# Patient Record
Sex: Female | Born: 1960 | Race: White | Hispanic: No | Marital: Single | Smoking: Current every day smoker
Health system: Southern US, Community
[De-identification: ages and names within clinical notes are randomized; demographics above are authoritative.]

## PROBLEM LIST (undated history)

## (undated) DIAGNOSIS — I739 Peripheral vascular disease, unspecified: Secondary | ICD-10-CM

## (undated) DIAGNOSIS — J449 Chronic obstructive pulmonary disease, unspecified: Secondary | ICD-10-CM

## (undated) DIAGNOSIS — F32A Depression, unspecified: Secondary | ICD-10-CM

## (undated) DIAGNOSIS — E785 Hyperlipidemia, unspecified: Secondary | ICD-10-CM

## (undated) DIAGNOSIS — I639 Cerebral infarction, unspecified: Secondary | ICD-10-CM

## (undated) DIAGNOSIS — R0600 Dyspnea, unspecified: Secondary | ICD-10-CM

## (undated) HISTORY — DX: Depression, unspecified: F32.A

## (undated) HISTORY — PX: INSERT / REPLACE / REMOVE PACEMAKER: SUR710

## (undated) HISTORY — PX: NO PAST SURGERIES: SHX2092

## (undated) HISTORY — DX: Chronic obstructive pulmonary disease, unspecified: J44.9

## (undated) HISTORY — PX: EYE SURGERY: SHX253

## (undated) HISTORY — DX: Hyperlipidemia, unspecified: E78.5

## (undated) HISTORY — PX: TUBAL LIGATION: SHX77

---

## 2020-09-12 ENCOUNTER — Other Ambulatory Visit: Payer: Self-pay

## 2020-09-12 ENCOUNTER — Ambulatory Visit (INDEPENDENT_AMBULATORY_CARE_PROVIDER_SITE_OTHER): Payer: 59 | Admitting: Family Medicine

## 2020-09-12 ENCOUNTER — Encounter: Payer: Self-pay | Admitting: Family Medicine

## 2020-09-12 DIAGNOSIS — R55 Syncope and collapse: Secondary | ICD-10-CM

## 2020-09-12 DIAGNOSIS — Z1211 Encounter for screening for malignant neoplasm of colon: Secondary | ICD-10-CM

## 2020-09-12 DIAGNOSIS — Z1231 Encounter for screening mammogram for malignant neoplasm of breast: Secondary | ICD-10-CM

## 2020-09-12 DIAGNOSIS — I739 Peripheral vascular disease, unspecified: Secondary | ICD-10-CM

## 2020-09-12 DIAGNOSIS — E785 Hyperlipidemia, unspecified: Secondary | ICD-10-CM | POA: Insufficient documentation

## 2020-09-12 DIAGNOSIS — F419 Anxiety disorder, unspecified: Secondary | ICD-10-CM

## 2020-09-12 HISTORY — DX: Encounter for screening mammogram for malignant neoplasm of breast: Z12.31

## 2020-09-12 HISTORY — DX: Peripheral vascular disease, unspecified: I73.9

## 2020-09-12 HISTORY — DX: Syncope and collapse: R55

## 2020-09-12 MED ORDER — TRAZODONE HCL 50 MG PO TABS: 50.0000 mg | ORAL_TABLET | Freq: Every day | ORAL | 3 refills | Status: DC

## 2020-09-12 MED ORDER — HYDROXYZINE HCL 25 MG PO TABS: 25.0000 mg | ORAL_TABLET | Freq: Three times a day (TID) | ORAL | 1 refills | Status: DC | PRN

## 2020-09-12 NOTE — Progress Notes (Signed)
New Patient Office Visit [facility] OV 3/21 History of Present Illness: Ms. Leslie Franklin (DoB: 09-01-1960) is a 60 y.o. female who presented to to discuss her family history of BRCA gene mutation. Her daughter has Hereditary Breast and Ovarian Cancer syndrome, having tested positive for BRCA2 c.9249C>G (p.Tyr3098*) mutation in December 2020. Based on autosomal dominant inheritance, Ms. Leslie Franklin has 50% risk of having the same mutation.   Oncology history: [name] has a history of cervical cancer diagnosed at age 17, treated with a LEEP procedure.   Family History of Cancer: A detailed 4-generation pedigree was elicited from patient.   Daughter with breast cancer at age 69. Genetic testing revealed that she has a BRCA2 mutation called c.9249C>G (p.Tyr3098*) Pt has not been tested for mutation-pt states she has kit at home  Subjective:  Patient ID: Leslie Franklin, female    DOB: 1960/10/12  Age: 60 y.o. MRN: [id number]  CC:  Chief Complaint  Patient presents with  . Establish Care  syncope-2 episodes-both related to movement.   HPI Halah Franklin presents for syncopal episodes-getting up and down pt passed out-one witnessed-no movement of arms/legs, no loss of urine. Pt recovered within a few minutes. Pt did not fell CP/SOB prior. NO headache or visual changes.  Anxiety-hydroxyzine in the past  Daughter with breast cancer-+ genetics Social History   Socioeconomic History  . Marital status: Not on file    Spouse name: Not on file  . Number of children: Not on file  . Years of education: Not on file  . Highest education level: Not on file  Occupational History  . Not on file  Tobacco Use  . Smoking status: Not on file  . Smokeless tobacco: Not on file  Substance and Sexual Activity  . Alcohol use: Not on file  . Drug use: Not on file  . Sexual activity: Not on file  Other Topics Concern  . Not on file  Social History Narrative  . Not on file   Social  Determinants of Health   Financial Resource Strain: Not on file  Food Insecurity: Not on file  Transportation Needs: Not on file  Physical Activity: Not on file  Stress: Not on file  Social Connections: Not on file  Intimate Partner Violence: Not on file    ROS Review of Systems  Constitutional: Positive for fatigue.  HENT:       Hearing loss  Eyes:       Glasses  Respiratory: Positive for cough.        Quit smoking 4 months ago  Cardiovascular: Negative.  Negative for chest pain.  Gastrointestinal: Positive for nausea.  Endocrine: Positive for heat intolerance.  Genitourinary:       Pt has not had a pap smear in 15 years-cervical cancer in biopsy  Musculoskeletal: Positive for back pain and neck pain. Negative for arthralgias.       Leg cramps  Skin: Negative.   Allergic/Immunologic: Negative for environmental allergies.  Neurological: Positive for dizziness and syncope.       No h/o of seizure d/o  Hematological: Negative.   Psychiatric/Behavioral: Positive for agitation and sleep disturbance. The patient is nervous/anxious.     Objective:  p-97, oxygen 96, bp 124/64, weight 122.6 Today's Vitals   09/12/20 1454 09/12/20 1458 09/12/20 1523  SpO2: 97% 96% 97%   Physical Exam Vitals reviewed.  Constitutional:      Appearance: Normal appearance.  HENT:     Head: Normocephalic  and atraumatic.     Right Ear: Tympanic membrane normal.     Left Ear: Tympanic membrane normal.     Nose: Nose normal.     Mouth/Throat:     Mouth: Mucous membranes are moist.  Eyes:     Conjunctiva/sclera: Conjunctivae normal.  Cardiovascular:     Rate and Rhythm: Normal rate and regular rhythm.     Pulses: Normal pulses.     Heart sounds: Normal heart sounds.  Pulmonary:     Effort: Pulmonary effort is normal.     Breath sounds: Normal breath sounds.  Abdominal:     General: Bowel sounds are normal.     Palpations: Abdomen is soft.  Musculoskeletal:        General: Normal range  of motion.     Cervical back: Normal range of motion and neck supple.  Neurological:     Mental Status: She is alert and oriented to person, place, and time.  Psychiatric:        Mood and Affect: Mood normal.        Behavior: Behavior normal.   ECG-SR  Assessment & Plan:  1. Hyperlipidemia, unspecified hyperlipidemia type  no medication currently - CBC with Differential - CMP14+EGFR - TSH - Lipid Panel  2. Syncope, unspecified syncope type ecg SR-2 syncopal episode related to [facility] static laying to sitting and standing-increase fluids. Pt eating well.  - CBC with Differential - CMP14+EGFR - TSH - Lipid Panel - EKG 12-Lead - Ambulatory referral to Cardiology  3. Screening mammogram for breast cancer - MM Digital Screening; Future  4. Screening for colon cancer - Ambulatory referral to Gastroenterology  5. Claudication ([place]) Pain with walking, no pain after resting-+ h/o tob use - Ambulatory referral to Vascular Surgery 6. Anxiety Trazodone, hydroxyzine Follow-up:  labwork tomorrow-schedule pap smear GI -colonoscopy Vascular-claudication mammogram  LISA [name], MD

## 2020-09-12 NOTE — Patient Instructions (Addendum)
[email or web address]">  Depression Screening Depression screening is a tool that your health care provider can use to learn if you have symptoms of depression. Depression is a common condition with many symptoms that are also often found in other conditions. Depression is treatable, but it must first be diagnosed. You may not know that certain feelings, thoughts, and behaviors that you are having can be symptoms of depression. Taking a depression screening test can help you and your health care provider decide if you need more assessment, or if you should be referred to a mental health care provider. What are the screening tests?  You may have a physical exam to see if another condition is affecting your mental health. You may have a blood or urine sample taken during the physical exam.  You may be interviewed using a screening tool that was developed from research, such as one of these: ? Patient Health Questionnaire (PHQ). This is a set of either 2 or 9 questions. A health care provider who has been trained to score this screening test uses a guide to assess if your symptoms suggest that you may have depression. ? Hamilton Depression Rating Scale (HAM-D). This is a set of either 17 or 24 questions. You may be asked to take it again during or after your treatment, to see if your depression has gotten better. ? Beck Depression Inventory (BDI). This is a set of 21 multiple choice questions. Your health care provider scores your answers to assess:  Your level of depression, ranging from mild to severe.  Your response to treatment.  Your health care provider may talk with you about your daily activities, such as eating, sleeping, work, and recreation, and ask if you have had any changes in activity.  Your health care provider may ask you to see a mental health specialist, such as a psychiatrist or psychologist, for more  evaluation. Who should be screened for depression?  All adults, including adults with a family history of a mental health disorder.  Adolescents who are 89-77 years old.  People who are recovering from a myocardial infarction (MI).  Pregnant women, or women who have given birth.  People who have a long-term (chronic) illness.  Anyone who has been diagnosed with another type of a mental health disorder.  Anyone who has symptoms that could show depression.   What do my results mean? Your health care provider will review the results of your depression screening, physical exam, and lab tests. Positive screens suggest that you may have depression. Screening is the first step in getting the care that you may need. It is up to you to get your screening results. Ask your health care provider, or the department that is doing your screening tests, when your results will be ready. Talk with your health care provider about your results and diagnosis. A diagnosis of depression is made using the Diagnostic and Statistical Manual of Mental Disorders (DSM-V). This is a book that lists the number and type of symptoms that must be present for a health care provider to give a specific diagnosis.  Your health care provider may work with you to treat your symptoms of depression, or your health care provider may help you find a mental health provider who can assess, diagnose, and treat your depression. Get help right away if:  You have thoughts about hurting yourself or others. If you ever feel like you may hurt yourself or others, or have thoughts about taking your own life, get help  right away. You can go to your nearest emergency department or call:  Your local emergency services (911 in the [place]  A suicide crisis helpline, such as the National Suicide Prevention Lifeline at (510)718-4311. This is open 24 hours a day. Summary  Depression screening is the first step in getting the help that you may  need.  If your screening test shows symptoms of depression (is positive), your health care provider may ask you to see a mental health provider.  Anyone who is age 14 or older should be screened for depression. This information is not intended to replace advice given to you by your health care provider. Make sure you discuss any questions you have with your health care provider. Document Revised: 02/08/2020 Document Reviewed: 02/08/2020 Elsevier Patient Education  2021 [employer]. Syncope Syncope is when you pass out (faint) for a short time. It is caused by a sudden decrease in blood flow to the brain. Signs that you may be about to pass out include:  Feeling dizzy or light-headed.  Feeling sick to your stomach (nauseous).  Seeing all white or all black.  Having cold, clammy skin. If you pass out, get help right away. Call your local emergency services (911 in the [place] Do not drive yourself to the hospital. Follow these instructions at home: Watch for any changes in your symptoms. Take these actions to stay safe and help with your symptoms: Lifestyle  Do not drive, use machinery, or play sports until your doctor says it is okay.  Do not drink alcohol.  Do not use any products that contain nicotine or tobacco, such as cigarettes and e-cigarettes. If you need help quitting, ask your doctor.  Drink enough fluid to keep your pee (urine) pale yellow. General instructions  Take over-the-counter and prescription medicines only as told by your doctor.  If you are taking blood pressure or heart medicine, sit up and stand up slowly. Spend a few minutes getting ready to sit and then stand. This can help you feel less dizzy.  Have someone stay with you until you feel stable.  If you start to feel like you might pass out, lie down right away and raise (elevate) your feet above the level of your heart. Breathe deeply and steadily. Wait until all of the symptoms are gone.  Keep all  follow-up visits as told by your doctor. This is important. Get help right away if:  You have a very bad headache.  You pass out once or more than once.  You have pain in your chest, belly, or back.  You have a very fast or uneven heartbeat (palpitations).  It hurts to breathe.  You are bleeding from your mouth or your bottom (rectum).  You have black or tarry poop (stool).  You have jerky movements that you cannot control (seizure).  You are confused.  You have trouble walking.  You are very weak.  You have vision problems. These symptoms may be an emergency. Do not wait to see if the symptoms will go away. Get medical help right away. Call your local emergency services (911 in the [place] Do not drive yourself to the hospital. Summary  Syncope is when you pass out (faint) for a short time. It is caused by a sudden decrease in blood flow to the brain.  Signs that you may be about to faint include feeling dizzy, light-headed, or sick to your stomach, seeing all white or all black, or having cold, clammy skin.  If  you start to feel like you might pass out, lie down right away and raise (elevate) your feet above the level of your heart. Breathe deeply and steadily. Wait until all of the symptoms are gone. This information is not intended to replace advice given to you by your health care provider. Make sure you discuss any questions you have with your health care provider. Document Revised: 09/27/2019 Document Reviewed: 09/29/2017 Elsevier Patient Education  2021 [employer] Inc. Mammogram F/u pap smear GI colonoscopy Claudication-vascular surgery Syncope-cardiology Lab work tomorrow -FASTING

## 2020-09-13 ENCOUNTER — Other Ambulatory Visit: Payer: Self-pay

## 2020-09-13 DIAGNOSIS — F411 Generalized anxiety disorder: Secondary | ICD-10-CM | POA: Insufficient documentation

## 2020-09-13 DIAGNOSIS — F419 Anxiety disorder, unspecified: Secondary | ICD-10-CM | POA: Insufficient documentation

## 2020-09-13 HISTORY — DX: Generalized anxiety disorder: F41.1

## 2020-09-13 HISTORY — DX: Anxiety disorder, unspecified: F41.9

## 2020-09-14 LAB — LIPID PANEL
Chol/HDL Ratio: 5.7 ratio — ABNORMAL HIGH (ref 0.0–4.4)
Cholesterol, Total: 292 mg/dL — ABNORMAL HIGH (ref 100–199)
HDL: 51 mg/dL (ref >39)
LDL Chol Calc (NIH): 218 mg/dL — ABNORMAL HIGH (ref 0–99)
Triglycerides: 128 mg/dL (ref 0–149)
VLDL Cholesterol Cal: 23 mg/dL (ref 5–40)

## 2020-09-14 LAB — CBC WITH DIFFERENTIAL/PLATELET
Basophils Absolute: 0.2 10*3/uL (ref 0.0–0.2)
Basos: 2 %
EOS (ABSOLUTE): 0.3 10*3/uL (ref 0.0–0.4)
Eos: 3 %
Hematocrit: 44.7 % (ref 34.0–46.6)
Hemoglobin: 14.9 g/dL (ref 11.1–15.9)
Immature Grans (Abs): 0 10*3/uL (ref 0.0–0.1)
Immature Granulocytes: 0 %
Lymphocytes Absolute: 2.7 10*3/uL (ref 0.7–3.1)
Lymphs: 34 %
MCH: 29.4 pg (ref 26.6–33.0)
MCHC: 33.3 g/dL (ref 31.5–35.7)
MCV: 88 fL (ref 79–97)
Monocytes Absolute: 0.5 10*3/uL (ref 0.1–0.9)
Monocytes: 7 %
Neutrophils Absolute: 4.2 10*3/uL (ref 1.4–7.0)
Neutrophils: 54 %
Platelets: 334 10*3/uL (ref 150–450)
RBC: 5.06 x10E6/uL (ref 3.77–5.28)
RDW: 11.9 % (ref 11.7–15.4)
WBC: 7.8 10*3/uL (ref 3.4–10.8)

## 2020-09-14 LAB — CMP14+EGFR
ALT: 12 IU/L (ref 0–32)
AST: 19 IU/L (ref 0–40)
Albumin/Globulin Ratio: 1.7 (ref 1.2–2.2)
Albumin: 4.2 g/dL (ref 3.8–4.9)
Alkaline Phosphatase: 111 IU/L (ref 44–121)
BUN/Creatinine Ratio: 17 (ref 9–23)
BUN: 17 mg/dL (ref 6–24)
Bilirubin Total: 0.5 mg/dL (ref 0.0–1.2)
CO2: 23 mmol/L (ref 20–29)
Calcium: 9.7 mg/dL (ref 8.7–10.2)
Chloride: 99 mmol/L (ref 96–106)
Creatinine, Ser: 1.03 mg/dL — ABNORMAL HIGH (ref 0.57–1.00)
GFR calc Af Amer: 70 mL/min/{1.73_m2} (ref >59)
GFR calc non Af Amer: 60 mL/min/{1.73_m2} (ref >59)
Globulin, Total: 2.5 g/dL (ref 1.5–4.5)
Glucose: 92 mg/dL (ref 65–99)
Potassium: 4.6 mmol/L (ref 3.5–5.2)
Sodium: 137 mmol/L (ref 134–144)
Total Protein: 6.7 g/dL (ref 6.0–8.5)

## 2020-09-14 LAB — TSH: TSH: 1.22 u[IU]/mL (ref 0.450–4.500)

## 2020-09-14 LAB — CARDIOVASCULAR RISK ASSESSMENT

## 2020-09-16 DIAGNOSIS — F32A Depression, unspecified: Secondary | ICD-10-CM | POA: Insufficient documentation

## 2020-09-16 DIAGNOSIS — J449 Chronic obstructive pulmonary disease, unspecified: Secondary | ICD-10-CM | POA: Insufficient documentation

## 2020-09-17 ENCOUNTER — Ambulatory Visit (INDEPENDENT_AMBULATORY_CARE_PROVIDER_SITE_OTHER): Payer: 59 | Admitting: Nurse Practitioner

## 2020-09-17 ENCOUNTER — Encounter: Payer: Self-pay | Admitting: Nurse Practitioner

## 2020-09-17 ENCOUNTER — Other Ambulatory Visit: Payer: Self-pay

## 2020-09-17 VITALS — BP 98/62 | HR 78 | Temp 97.0°F | Ht 62.0 in | Wt 123.0 lb

## 2020-09-17 DIAGNOSIS — Z87891 Personal history of nicotine dependence: Secondary | ICD-10-CM

## 2020-09-17 DIAGNOSIS — Z01419 Encounter for gynecological examination (general) (routine) without abnormal findings: Secondary | ICD-10-CM

## 2020-09-17 DIAGNOSIS — Z8742 Personal history of other diseases of the female genital tract: Secondary | ICD-10-CM | POA: Diagnosis not present

## 2020-09-17 DIAGNOSIS — Z8481 Family history of carrier of genetic disease: Secondary | ICD-10-CM | POA: Diagnosis not present

## 2020-09-17 DIAGNOSIS — E785 Hyperlipidemia, unspecified: Secondary | ICD-10-CM

## 2020-09-17 MED ORDER — ROSUVASTATIN CALCIUM 5 MG PO TABS: 5.0000 mg | ORAL_TABLET | Freq: Every day | ORAL | 0 refills | Status: DC

## 2020-09-17 NOTE — Patient Instructions (Addendum)
We will call you with pap results Return in 58-month (fasting)  High Cholesterol  High cholesterol is a condition in which the blood has high levels of a white, waxy substance similar to fat (cholesterol). The liver makes all the cholesterol that the body needs. The human body needs small amounts of cholesterol to help build cells. A person gets extra or excess cholesterol from the food that he or she eats. The blood carries cholesterol from the liver to the rest of the body. If you have high cholesterol, deposits (plaques) may build up on the walls of your arteries. Arteries are the blood vessels that carry blood away from your heart. These plaques make the arteries narrow and stiff. Cholesterol plaques increase your risk for heart attack and stroke. Work with your health care provider to keep your cholesterol levels in a healthy range. What increases the risk? The following factors may make you more likely to develop this condition: Eating foods that are high in animal fat (saturated fat) or cholesterol. Being overweight. Not getting enough exercise. A family history of high cholesterol (familial hypercholesterolemia). Use of tobacco products. Having diabetes. What are the signs or symptoms? There are no symptoms of this condition. How is this diagnosed? This condition may be diagnosed based on the results of a blood test. If you are older than 58years of age, your health care provider may check your cholesterol levels every 4-6 years. You may be checked more often if you have high cholesterol or other risk factors for heart disease. The blood test for cholesterol measures: "Bad" cholesterol, or LDL cholesterol. This is the main type of cholesterol that causes heart disease. The desired level is less than 100 mg/dL. "Good" cholesterol, or HDL cholesterol. HDL helps protect against heart disease by cleaning the arteries and carrying the LDL to the liver for processing. The desired level for  HDL is 60 mg/dL or higher. Triglycerides. These are fats that your body can store or burn for energy. The desired level is less than 150 mg/dL. Total cholesterol. This measures the total amount of cholesterol in your blood and includes LDL, HDL, and triglycerides. The desired level is less than 200 mg/dL. How is this treated? This condition may be treated with: Diet changes. You may be asked to eat foods that have more fiber and less saturated fats or added sugar. Lifestyle changes. These may include regular exercise, maintaining a healthy weight, and quitting use of tobacco products. Medicines. These are given when diet and lifestyle changes have not worked. You may be prescribed a statin medicine to help lower your cholesterol levels. Follow these instructions at home: Eating and drinking Eat a healthy, balanced diet. This diet includes: Daily servings of a variety of fresh, frozen, or canned fruits and vegetables. Daily servings of whole grain foods that are rich in fiber. Foods that are low in saturated fats and trans fats. These include poultry and fish without skin, lean cuts of meat, and low-fat dairy products. A variety of fish, especially oily fish that contain omega-3 fatty acids. Aim to eat fish at least 2 times a week. Avoid foods and drinks that have added sugar. Use healthy cooking methods, such as roasting, grilling, broiling, baking, poaching, steaming, and stir-frying. Do not fry your food except for stir-frying.   Lifestyle Get regular exercise. Aim to exercise for a total of 150 minutes a week. Increase your activity level by doing activities such as gardening, walking, and taking the stairs. Do not use  any products that contain nicotine or tobacco, such as cigarettes, e-cigarettes, and chewing tobacco. If you need help quitting, ask your health care provider.   General instructions Take over-the-counter and prescription medicines only as told by your health care  provider. Keep all follow-up visits as told by your health care provider. This is important. Where to find more information American Heart Association: www.heart.org National Heart, Lung, and Blood Institute: https://wilson-eaton.com/ Contact a health care provider if: You have trouble achieving or maintaining a healthy diet or weight. You are starting an exercise program. You are unable to stop smoking. Get help right away if: You have chest pain. You have trouble breathing. You have any symptoms of a stroke. "BE FAST" is an easy way to remember the main warning signs of a stroke: B - Balance. Signs are dizziness, sudden trouble walking, or loss of balance. E - Eyes. Signs are trouble seeing or a sudden change in vision. F - Face. Signs are sudden weakness or numbness of the face, or the face or eyelid drooping on one side. A - Arms. Signs are weakness or numbness in an arm. This happens suddenly and usually on one side of the body. S - Speech. Signs are sudden trouble speaking, slurred speech, or trouble understanding what people say. T - Time. Time to call emergency services. Write down what time symptoms started. You have other signs of a stroke, such as: A sudden, severe headache with no known cause. Nausea or vomiting. Seizure. These symptoms may represent a serious problem that is an emergency. Do not wait to see if the symptoms will go away. Get medical help right away. Call your local emergency services (911 in the [place] Do not drive yourself to the hospital. Summary Cholesterol plaques increase your risk for heart attack and stroke. Work with your health care provider to keep your cholesterol levels in a healthy range. Eat a healthy, balanced diet, get regular exercise, and maintain a healthy weight. Do not use any products that contain nicotine or tobacco, such as cigarettes, e-cigarettes, and chewing tobacco. Get help right away if you have any symptoms of a stroke. This information  is not intended to replace advice given to you by your health care provider. Make sure you discuss any questions you have with your health care provider. Document Revised: 07/17/2019 Document Reviewed: 07/17/2019 Elsevier Patient Education  2021 [place]. for follow-up Keep scheduled appointments for colonoscopy and mammogram  Preventive Care 53-9 Years Old, Female Preventive care refers to lifestyle choices and visits with your health care provider that can promote health and wellness. This includes:  A yearly physical exam. This is also called an annual wellness visit.  Regular dental and eye exams.  Immunizations.  Screening for certain conditions.  Healthy lifestyle choices, such as: ? Eating a healthy diet. ? Getting regular exercise. ? Not using drugs or products that contain nicotine and tobacco. ? Limiting alcohol use. What can I expect for my preventive care visit? Physical exam Your health care provider will check your:  Height and weight. These may be used to calculate your BMI (body mass index). BMI is a measurement that tells if you are at a healthy weight.  Heart rate and blood pressure.  Body temperature.  Skin for abnormal spots. Counseling Your health care provider may ask you questions about your:  Past medical problems.  Family's medical history.  Alcohol, tobacco, and drug use.  Emotional well-being.  Home life and relationship well-being.  Sexual activity.  Diet, exercise, and sleep habits.  Work and work Statistician.  Access to firearms.  Method of birth control.  Menstrual cycle.  Pregnancy history. What immunizations do I need? Vaccines are usually given at various ages, according to a schedule. Your health care provider will recommend vaccines for you based on your age, medical history, and lifestyle or other factors, such as travel or where you work.   What tests do I need? Blood tests  Lipid and cholesterol levels. These  may be checked every 5 years, or more often if you are over 37 years old.  Hepatitis C test.  Hepatitis B test. Screening  Lung cancer screening. You may have this screening every year starting at age 59 if you have a 30-pack-year history of smoking and currently smoke or have quit within the past 15 years.  Colorectal cancer screening. ? All adults should have this screening starting at age 55 and continuing until age 73. ? Your health care provider may recommend screening at age 55 if you are at increased risk. ? You will have tests every 1-10 years, depending on your results and the type of screening test.  Diabetes screening. ? This is done by checking your blood sugar (glucose) after you have not eaten for a while (fasting). ? You may have this done every 1-3 years.  Mammogram. ? This may be done every 1-2 years. ? Talk with your health care provider about when you should start having regular mammograms. This may depend on whether you have a family history of breast cancer.  BRCA-related cancer screening. This may be done if you have a family history of breast, ovarian, tubal, or peritoneal cancers.  Pelvic exam and Pap test. ? This may be done every 3 years starting at age 62. ? Starting at age 22, this may be done every 5 years if you have a Pap test in combination with an HPV test. Other tests  STD (sexually transmitted disease) testing, if you are at risk.  Bone density scan. This is done to screen for osteoporosis. You may have this scan if you are at high risk for osteoporosis. Talk with your health care provider about your test results, treatment options, and if necessary, the need for more tests. Follow these instructions at home: Eating and drinking  Eat a diet that includes fresh fruits and vegetables, whole grains, lean protein, and low-fat dairy products.  Take vitamin and mineral supplements as recommended by your health care provider.  Do not drink alcohol  if: ? Your health care provider tells you not to drink. ? You are pregnant, may be pregnant, or are planning to become pregnant.  If you drink alcohol: ? Limit how much you have to 0-1 drink a day. ? Be aware of how much alcohol is in your drink. In the [place] one drink equals one 12 oz bottle of beer (355 mL), one 5 oz glass of wine (148 mL), or one 1 oz glass of hard liquor (44 mL).   Lifestyle  Take daily care of your teeth and gums. Brush your teeth every morning and night with fluoride toothpaste. Floss one time each day.  Stay active. Exercise for at least 30 minutes 5 or more days each week.  Do not use any products that contain nicotine or tobacco, such as cigarettes, e-cigarettes, and chewing tobacco. If you need help quitting, ask your health care provider.  Do not use drugs.  If you are sexually active, practice safe sex. Use  a condom or other form of protection to prevent STIs (sexually transmitted infections).  If you do not wish to become pregnant, use a form of birth control. If you plan to become pregnant, see your health care provider for a prepregnancy visit.  If told by your health care provider, take low-dose aspirin daily starting at age 73.  Find healthy ways to cope with stress, such as: ? Meditation, yoga, or listening to music. ? Journaling. ? Talking to a trusted person. ? Spending time with friends and family. Safety  Always wear your seat belt while driving or riding in a vehicle.  Do not drive: ? If you have been drinking alcohol. Do not ride with someone who has been drinking. ? When you are tired or distracted. ? While texting.  Wear a helmet and other protective equipment during sports activities.  If you have firearms in your house, make sure you follow all gun safety procedures. What's next?  Visit your health care provider once a year for an annual wellness visit.  Ask your health care provider how often you should have your eyes and  teeth checked.  Stay up to date on all vaccines. This information is not intended to replace advice given to you by your health care provider. Make sure you discuss any questions you have with your health care provider. Document Revised: 05/21/2020 Document Reviewed: 04/28/2018 Elsevier Patient Education  2021 [place].  Pap Test Why am I having this test? A Pap test, also called a Pap smear, is a screening test to check for signs of:  Cancer of the vagina, cervix, and uterus. The cervix is the lower part of the uterus that opens into the vagina.  Infection.  Changes that may be a sign that cancer is developing (precancerous changes). Women need this test on a regular basis. In general, you should have a Pap test every 3 years until you reach menopause or age 23. Women aged 30-60 may choose to have their Pap test done at the same time as an HPV (human papillomavirus) test every 5 years (instead of every 3 years). Your health care provider may recommend having Pap tests more or less often depending on your medical conditions and past Pap test results. What kind of sample is taken? Your health care provider will collect a sample of cells from the surface of your cervix. This will be done using a small cotton swab, plastic spatula, or brush. This sample is often collected during a pelvic exam, when you are lying on your back on an exam table with feet in footrests (stirrups). In some cases, fluids (secretions) from the cervix or vagina may also be collected.   How do I prepare for this test?  Be aware of where you are in your menstrual cycle. If you are menstruating on the day of the test, you may be asked to reschedule.  You may need to reschedule if you have a known vaginal infection on the day of the test.  Follow instructions from your health care provider about: ? Changing or stopping your regular medicines. Some medicines can cause abnormal test results, such as digitalis and  tetracycline. ? Avoiding douching or taking a bath the day before or the day of the test. Tell a health care provider about:  Any allergies you have.  All medicines you are taking, including vitamins, herbs, eye drops, creams, and over-the-counter medicines.  Any blood disorders you have.  Any surgeries you have had.  Any  medical conditions you have.  Whether you are pregnant or may be pregnant. How are the results reported? Your test results will be reported as either abnormal or normal. A false-positive result can occur. A false positive is incorrect because it means that a condition is present when it is not. A false-negative result can occur. A false negative is incorrect because it means that a condition is not present when it is. What do the results mean? A normal test result means that you do not have signs of cancer of the vagina, cervix, or uterus. An abnormal result may mean that you have:  Cancer. A Pap test by itself is not enough to diagnose cancer. You will have more tests done in this case.  Precancerous changes in your vagina, cervix, or uterus.  Inflammation of the cervix.  An STD (sexually transmitted disease).  A fungal infection.  A parasite infection. Talk with your health care provider about what your results mean. Questions to ask your health care provider Ask your health care provider, or the department that is doing the test:  When will my results be ready?  How will I get my results?  What are my treatment options?  What other tests do I need?  What are my next steps? Summary  In general, women should have a Pap test every 3 years until they reach menopause or age 57.  Your health care provider will collect a sample of cells from the surface of your cervix. This will be done using a small cotton swab, plastic spatula, or brush.  In some cases, fluids (secretions) from the cervix or vagina may also be collected. This information is not  intended to replace advice given to you by your health care provider. Make sure you discuss any questions you have with your health care provider. Document Revised: 04/24/2020 Document Reviewed: 04/19/2020 Elsevier Patient Education  [place].

## 2020-09-17 NOTE — Progress Notes (Signed)
Established Patient Office Visit  Subjective:  Patient ID: Leslie Franklin, female    DOB: 01/21/1961  Age: 60 y.o. MRN: [id number]  CC:  Chief Complaint  Patient presents with  . Pap smear    HPI Leslie Franklin is a 60 year old Caucasian female that was established as a new patient 1-week ago. She has returned today for pap smear. She tells me that she had an abnormal pap smear approximately several years ago that was treated with cervical excision. She did not follow-up afterwards.She is a poor health history historian, she is unable to recall approximate last pap smear. She estimates approximately 15 years since last pap smear. Medical records request has been sent. Her daughter was diagnosed with breast cancer at age 33 with positive BRCA2 genetic testing for hereditary breast and ovarian cancer. She quit smoking 33-month ago.   Past Medical History:  Diagnosis Date  . Anxiety 09/13/2020  . [place] 09/12[place]  . COPD (chronic obstructive pulmonary disease) ([facility]   . Depression   . Hyperlipidemia   . Screening mammogram for breast cancer 09/12/2020  . Syncope 09/12/2020    History reviewed. No pertinent surgical history.  Family History  Problem Relation Age of Onset  . Hypertension Mother   . Diabetes Mother   . Heart attack Father   . Emphysema Sister   . Stroke Brother     Social History   Socioeconomic History  . Marital status: Single    Spouse name: Not on file  . Number of children: 4  . Years of education: Not on file  . Highest education level: Not on file  Occupational History  . Not on file  Tobacco Use  . Smoking status: Former Smoker    Packs/day: 1.00    Years: 41.00    Pack years: 41.00  . Smokeless tobacco: Never Used  Substance and Sexual Activity  . Alcohol use: Never  . Drug use: Never  . Sexual activity: Not on file  Other Topics Concern  . Not on file  Social History Narrative  . Not on file   Social Determinants of Health    Financial Resource Strain: Not on file  Food Insecurity: Not on file  Transportation Needs: Not on file  Physical Activity: Not on file  Stress: Not on file  Social Connections: Not on file  Intimate Partner Violence: Not on file    Outpatient Medications Prior to Visit  Medication Sig Dispense Refill  . hydrOXYzine (ATARAX/VISTARIL) 25 MG tablet Take 1 tablet (25 mg total) by mouth 3 (three) times daily as needed. 90 tablet 1  . traZODone (DESYREL) 50 MG tablet Take 1 tablet (50 mg total) by mouth at bedtime. 30 tablet 3   No facility-administered medications prior to visit.    No Known Allergies  ROS Review of Systems  Constitutional: Positive for fatigue. Negative for unexpected weight change.  HENT: Negative for congestion, ear pain, postnasal drip and sore throat.   Eyes:       Vision changes  Respiratory: Positive for shortness of breath. Negative for chest tightness.   Cardiovascular: Positive for palpitations. Negative for chest pain.  Gastrointestinal: Negative for abdominal pain, anal bleeding, blood in stool, constipation, diarrhea, nausea and vomiting.  Endocrine: Positive for polydipsia. Negative for polyphagia and polyuria.  Genitourinary: Negative for dysuria and hematuria.       Poor bladder control  Musculoskeletal: Positive for myalgias (bilateral legs). Negative for arthralgias and back pain.  Skin: Negative.   Neurological:  Negative for weakness and headaches.      Objective:    Physical Exam Vitals reviewed.  Constitutional:      Appearance: Normal appearance. She is normal weight.  HENT:     Head: Normocephalic.  Cardiovascular:     Rate and Rhythm: Normal rate and regular rhythm.     Pulses: Normal pulses.     Heart sounds: Normal heart sounds.  Pulmonary:     Effort: Pulmonary effort is normal.     Breath sounds: Normal breath sounds.  Abdominal:     General: Bowel sounds are normal.     Palpations: Abdomen is soft.  Genitourinary:     General: Normal vulva.     Vagina: No vaginal discharge.     Rectum: Normal.  Skin:    General: Skin is warm and dry.     Capillary Refill: Capillary refill takes less than 2 seconds.  Neurological:     General: No focal deficit present.     Mental Status: She is alert and oriented to person, place, and time.  Psychiatric:        Mood and Affect: Mood normal.        Behavior: Behavior normal.     BP 98/62 (BP Location: Left Arm, Patient Position: Sitting)   Pulse 78   Temp (!) 97 F (36.1 C) (Temporal)   Ht '5\' 2"'  (1.575 m)   Wt 123 lb (55.8 kg)   SpO2 98%   BMI 22.50 kg/m  Wt Readings from Last 3 Encounters:  09/17/20 123 lb (55.8 kg)     Health Maintenance Due  Topic Date Due  . Hepatitis C Screening  Never done  . COVID-19 Vaccine (1) Never done  . HIV Screening  Never done  . TETANUS/TDAP  Never done  . PAP SMEAR-Modifier  Never done  . COLONOSCOPY (Pts 45-65yr Insurance coverage will need to be confirmed)  Never done  . MAMMOGRAM  Never done  . INFLUENZA VACCINE  Never done    There are no preventive care reminders to display for this patient.  Lab Results  Component Value Date   TSH 1.220 09/13/2020   Lab Results  Component Value Date   WBC 7.8 09/13/2020   HGB 14.9 09/13/2020   HCT 44.7 09/13/2020   MCV 88 09/13/2020   PLT 334 09/13/2020   Lab Results  Component Value Date   NA 137 09/13/2020   K 4.6 09/13/2020   CO2 23 09/13/2020   GLUCOSE 92 09/13/2020   BUN 17 09/13/2020   CREATININE 1.03 (H) 09/13/2020   BILITOT 0.5 09/13/2020   ALKPHOS 111 09/13/2020   AST 19 09/13/2020   ALT 12 09/13/2020   PROT 6.7 09/13/2020   ALBUMIN 4.2 09/13/2020   CALCIUM 9.7 09/13/2020   Lab Results  Component Value Date   CHOL 292 (H) 09/13/2020   Lab Results  Component Value Date   HDL 51 09/13/2020   Lab Results  Component Value Date   LDLCALC 218 (H) 09/13/2020   Lab Results  Component Value Date   TRIG 128 09/13/2020   Lab Results   Component Value Date   CHOLHDL 5.7 (H) 09/13/2020       Assessment & Plan:   1. Encounter for well woman exam with routine gynecological exam - IGP, Aptima HPV, rfx 16/18,45  2. History of abnormal cervical Pap smear - IGP, Aptima HPV, rfx 16/18,45  3. Former cigarette smoker -Consider low dose chest CT for lung cancer screening  4. Family history of BRCA2 gene positive -Keep scheduled mammogram appointment    Problem List Items Addressed This Visit          Follow-up: Return in about  81-month   SRip Harbour NP

## 2020-09-18 ENCOUNTER — Telehealth (HOSPITAL_COMMUNITY): Payer: Self-pay | Admitting: *Deleted

## 2020-09-18 ENCOUNTER — Ambulatory Visit (INDEPENDENT_AMBULATORY_CARE_PROVIDER_SITE_OTHER): Payer: 59

## 2020-09-18 ENCOUNTER — Encounter: Payer: Self-pay | Admitting: Cardiology

## 2020-09-18 ENCOUNTER — Ambulatory Visit (INDEPENDENT_AMBULATORY_CARE_PROVIDER_SITE_OTHER): Payer: 59 | Admitting: Cardiology

## 2020-09-18 ENCOUNTER — Telehealth: Payer: Self-pay | Admitting: Cardiology

## 2020-09-18 VITALS — BP 118/66 | HR 72 | Ht 63.0 in | Wt 124.0 lb

## 2020-09-18 DIAGNOSIS — J431 Panlobular emphysema: Secondary | ICD-10-CM

## 2020-09-18 DIAGNOSIS — R55 Syncope and collapse: Secondary | ICD-10-CM

## 2020-09-18 DIAGNOSIS — E782 Mixed hyperlipidemia: Secondary | ICD-10-CM | POA: Diagnosis not present

## 2020-09-18 DIAGNOSIS — F17211 Nicotine dependence, cigarettes, in remission: Secondary | ICD-10-CM | POA: Insufficient documentation

## 2020-09-18 DIAGNOSIS — F17219 Nicotine dependence, cigarettes, with unspecified nicotine-induced disorders: Secondary | ICD-10-CM

## 2020-09-18 DIAGNOSIS — R079 Chest pain, unspecified: Secondary | ICD-10-CM

## 2020-09-18 DIAGNOSIS — R011 Cardiac murmur, unspecified: Secondary | ICD-10-CM | POA: Insufficient documentation

## 2020-09-18 DIAGNOSIS — Z87891 Personal history of nicotine dependence: Secondary | ICD-10-CM | POA: Insufficient documentation

## 2020-09-18 DIAGNOSIS — R002 Palpitations: Secondary | ICD-10-CM | POA: Insufficient documentation

## 2020-09-18 HISTORY — DX: Chest pain, unspecified: R07.9

## 2020-09-18 HISTORY — DX: Palpitations: R00.2

## 2020-09-18 HISTORY — DX: Cardiac murmur, unspecified: R01.1

## 2020-09-18 HISTORY — DX: Personal history of nicotine dependence: Z87.891

## 2020-09-18 HISTORY — DX: Nicotine dependence, cigarettes, with unspecified nicotine-induced disorders: F17.219

## 2020-09-18 MED ORDER — ROSUVASTATIN CALCIUM 20 MG PO TABS: 20.0000 mg | ORAL_TABLET | Freq: Every day | ORAL | 3 refills | Status: DC

## 2020-09-18 NOTE — Progress Notes (Signed)
Cardiology Office Note:    Date:  09/18/2020   ID:  Leslie Franklin, DOB 02-28-1961, MRN [id number]  PCP:  [name], NP  Cardiologist:  Garwin Brothers, MD   Referring MD: Wandra Feinstein, MD    ASSESSMENT:    1. Mixed hyperlipidemia   2. Syncope, unspecified syncope type   3. Ex-smoker   4. Panlobular emphysema (HCC)   5. Chest pain of uncertain etiology   6. Palpitations   7. Cardiac murmur    PLAN:    In order of problems listed above:  1. Primary prevention stressed with patient.  Importance of compliance with diet medication stressed and she vocalized understanding. 2. Chest discomfort: Atypical in nature however in view of risk factors we will do a Lexiscan sestamibi.  In the interim she has not to stress her self mentally or physically and I discussed this with her. 3. Cardiac murmur: Echocardiogram will be done to assess murmur heard on auscultation. 4. History of syncope and unsteady gait we will do a 2-week monitor to assess for any bradycardia arrhythmias.  I would suggest to primary care to consider doing CT of the brain to look for any space-occupying lesions in view of the significant concerns. 5. Marked dyslipidemia: Diet was emphasized.  I increased her Crestor to 20 mg daily and she will be back in 6 weeks for follow-up blood work. 6. Ex-smoker: I spent 5 minutes with the patient discussing solely about smoking. Smoking cessation was counseled. I suggested to the patient also different medications and pharmacological interventions. Patient is keen to try stopping on its own at this time. He will get back to me if he needs any further assistance in this matter.  She was told never to go back to smoking and she promises to do so. 7. Patient will be seen in follow-up appointment in 2 months or earlier if the patient has any concerns    Medication Adjustments/Labs and Tests Ordered: Current medicines are reviewed at length with the patient today.  Concerns  regarding medicines are outlined above.  No orders of the defined types were placed in this encounter.  No orders of the defined types were placed in this encounter.    History of Present Illness:    Leslie Franklin is a 60 y.o. female who is being seen today for the evaluation of chest discomfort and syncope at the request of Corum, Minerva Fester, MD.  [name].  She has past medical history of COPD.  She was a heavy smoker but quit 4 months ago.  She mentions to me that she is losing her balance and has passed out at a time or 2.  She denies any chest pain orthopnea or PND.  She has marked hyperlipidemia.  She complains of chest discomfort at times but this is not related to exertion.  Vague sensation in the chest.  No radiation to any part of the body.  Past Medical History:  Diagnosis Date  . Anxiety 09/13/2020  . Claudication (HCC) 09/12/2020  . COPD (chronic obstructive pulmonary disease) (HCC)   . Depression   . Hyperlipidemia   . Screening mammogram for breast cancer 09/12/2020  . Syncope 09/12/2020    Past Surgical History:  Procedure Laterality Date  . NO PAST SURGERIES      Current Medications: Current Meds  Medication Sig  . hydrOXYzine (ATARAX/VISTARIL) 25 MG tablet Take 1 tablet (25 mg total) by mouth 3 (three) times daily as  needed.  . rosuvastatin (CRESTOR) 5 MG tablet Take 1 tablet (5 mg total) by mouth daily.  . traZODone (DESYREL) 50 MG tablet Take 1 tablet (50 mg total) by mouth at bedtime.     Allergies:   Patient has no known allergies.   Social History   Socioeconomic History  . Marital status: Single    Spouse name: Not on file  . Number of children: 4  . Years of education: Not on file  . Highest education level: Not on file  Occupational History  . Not on file  Tobacco Use  . Smoking status: Former Smoker    Packs/day: 1.00    Years: 41.00    Pack years: 41.00  . Smokeless tobacco: Never Used  Substance and Sexual  Activity  . Alcohol use: Never  . Drug use: Never  . Sexual activity: Not on file  Other Topics Concern  . Not on file  Social History Narrative  . Not on file   Social Determinants of Health   Financial Resource Strain: Not on file  Food Insecurity: Not on file  Transportation Needs: Not on file  Physical Activity: Not on file  Stress: Not on file  Social Connections: Not on file     Family History: The patient's family history includes Diabetes in her mother; Emphysema in her sister; Heart attack in her father; Hypertension in her mother; Stroke in her brother.  ROS:   Please see the history of present illness.    All other systems reviewed and are negative.  EKGs/Labs/Other Studies Reviewed:    The following studies were reviewed today: EKG reveals sinus rhythm and nonspecific ST-T changes   Recent Labs: 09/13/2020: ALT 12; BUN 17; Creatinine, Ser 1.03; Hemoglobin 14.9; Platelets 334; Potassium 4.6; Sodium 137; TSH 1.220  Recent Lipid Panel    Component Value Date/Time   CHOL 292 (H) 09/13/2020 1205   TRIG 128 09/13/2020 1205   HDL 51 09/13/2020 1205   CHOLHDL 5.7 (H) 09/13/2020 1205   LDLCALC 218 (H) 09/13/2020 1205    Physical Exam:    VS:  BP 118/66   Pulse 72   Ht 5\' 3"  (1.6 m)   Wt 124 lb (56.2 kg)   SpO2 97%   BMI 21.97 kg/m     Wt Readings from Last 3 Encounters:  09/18/20 124 lb (56.2 kg)  09/17/20 123 lb (55.8 kg)     GEN: Patient is in no acute distress HEENT: Normal NECK: No JVD; No carotid bruits LYMPHATICS: No lymphadenopathy CARDIAC: S1 S2 regular, 2/6 systolic murmur at the apex. RESPIRATORY:  Clear to auscultation without rales, wheezing or rhonchi  ABDOMEN: Soft, non-tender, non-distended MUSCULOSKELETAL:  No edema; No deformity  SKIN: Warm and dry NEUROLOGIC:  Alert and oriented x 3 PSYCHIATRIC:  Normal affect    Signed, 09/19/20, MD  09/18/2020 9:08 AM     [facility]

## 2020-09-18 NOTE — Telephone Encounter (Signed)
Follow up:    Patient returning a call back stating that the number is correct for her to received instructions.Leslie Franklin

## 2020-09-18 NOTE — Patient Instructions (Signed)
Medication Instructions:  Your physician has recommended you make the following change in your medication:   Start Crestor 20 mg daily.  *If you need a refill on your cardiac medications before your next appointment, please call your pharmacy*   Lab Work: Your physician recommends that you return for lab work in: 6 weeks (10/30/20).You need to have labs done when you are fasting.  You can come Monday through Friday 8:30 am to 12:00 pm and 1:15 to 4:30. You do not need to make an appointment as the order has already been placed. The labs you are going to have done are BMET, LFT and Lipids.  If you have labs (blood work) drawn today and your tests are completely normal, you will receive your results only by: Marland Kitchen. MyChart Message (if you have MyChart) OR . A paper copy in the mail If you have any lab test that is abnormal or we need to change your treatment, we will call you to review the results.   Testing/Procedures: Your physician has requested that you have an echocardiogram. Echocardiography is a painless test that uses sound waves to create images of your heart. It provides your doctor with information about the size and shape of your heart and how well your heart's chambers and valves are working. This procedure takes approximately one hour. There are no restrictions for this procedure.  Your physician has requested that you have a lexiscan myoview. For further information please visit [email or web address]. Please follow instruction sheet, as given.  The test will take approximately 3 to 4 hours to complete; you may bring reading material.  If someone comes with you to your appointment, they will need to remain in the main lobby due to limited space in the testing area. **If you are pregnant or breastfeeding, please notify the nuclear lab prior to your appointment**  How to prepare for your Myocardial Perfusion Test: . Do not eat or drink 3 hours prior to your test, except you may have  water. . Do not consume products containing caffeine (regular or decaffeinated) 12 hours prior to your test. (ex: coffee, chocolate, sodas, tea). . Do bring a list of your current medications with you.  If not listed below, you may take your medications as normal. . Do wear comfortable clothes (no dresses or overalls) and walking shoes, tennis shoes preferred (No heels or open toe shoes are allowed). . Do NOT wear cologne, perfume, aftershave, or lotions (deodorant is allowed). . If these instructions are not followed, your test will have to be rescheduled.   WHY IS MY DOCTOR PRESCRIBING ZIO? The Zio system is proven and trusted by physicians to detect and diagnose irregular heart rhythms -- and has been prescribed to hundreds of thousands of patients.  The FDA has cleared the Zio system to monitor for many different kinds of irregular heart rhythms. In a study, physicians were able to reach a diagnosis 90% of the time with the Zio system1.  You can wear the Zio monitor -- a small, discreet, comfortable patch -- during your normal day-to-day activity, including while you sleep, shower, and exercise, while it records every single heartbeat for analysis.  1Barrett, P., et al. Comparison of 24 Hour Holter Monitoring Versus 14 Day Novel Adhesive Patch Electrocardiographic Monitoring. American Journal of Medicine, 2014.  ZIO VS. HOLTER MONITORING The Zio monitor can be comfortably worn for up to 14 days. Holter monitors can be worn for 24 to 48 hours, limiting the time to record any irregular heart  rhythms you may have. Zio is able to capture data for the 51% of patients who have their first symptom-triggered arrhythmia after 48 hours.1  LIVE WITHOUT RESTRICTIONS The Zio ambulatory cardiac monitor is a small, unobtrusive, and water-resistant patch--you might even forget you're wearing it. The Zio monitor records and stores every beat of your heart, whether you're sleeping, working out, or  showering.  Wear the monitor for 2 weeks, remove on 10/02/20.   Follow-Up: At [facility], you and your health needs are our priority.  As part of our continuing mission to provide you with exceptional heart care, we have created designated Provider Care Teams.  These Care Teams include your primary Cardiologist (physician) and Advanced Practice Providers (APPs -  Physician Assistants and Nurse Practitioners) who all work together to provide you with the care you need, when you need it.  We recommend signing up for the patient portal called "MyChart".  Sign up information is provided on this After Visit Summary.  MyChart is used to connect with patients for Virtual Visits (Telemedicine).  Patients are able to view lab/test results, encounter notes, upcoming appointments, etc.  Non-urgent messages can be sent to your provider as well.   To learn more about what you can do with MyChart, go to [email or web address].    Your next appointment:   2 month(s)  The format for your next appointment:   In Person  Provider:   Belva Crome, MD   Other Instructions  Cardiac Nuclear Scan  A cardiac nuclear scan is a test that is done to check the flow of blood to your heart. It is done when you are resting and when you are exercising. The test looks for problems such as:  Not enough blood reaching a portion of the heart.  The heart muscle not working as it should. You may need this test if:  You have heart disease.  You have had lab results that are not normal.  You have had heart surgery or a balloon procedure to open up blocked arteries (angioplasty).  You have chest pain.  You have shortness of breath. In this test, a special dye (tracer) is put into your bloodstream. The tracer will travel to your heart. A camera will then take pictures of your heart to see how the tracer moves through your heart. This test is usually done at a hospital and takes 2-4 hours. Tell a doctor  about:  Any allergies you have.  All medicines you are taking, including vitamins, herbs, eye drops, creams, and over-the-counter medicines.  Any problems you or family members have had with anesthetic medicines.  Any blood disorders you have.  Any surgeries you have had.  Any medical conditions you have.  Whether you are pregnant or may be pregnant. What are the risks? Generally, this is a safe test. However, problems may occur, such as:  Serious chest pain and heart attack. This is only a risk if the stress portion of the test is done.  Rapid heartbeat.  A feeling of warmth in your chest. This feeling usually does not last long.  Allergic reaction to the tracer. What happens before the test?  Ask your doctor about changing or stopping your normal medicines. This is important.  Follow instructions from your doctor about what you cannot eat or drink.  Remove your jewelry on the day of the test. What happens during the test? 1. An IV tube will be inserted into one of your veins. 2. Your doctor will give  you a small amount of tracer through the IV tube. 3. You will wait for 20-40 minutes while the tracer moves through your bloodstream. 4. Your heart will be monitored with an electrocardiogram (ECG). 5. You will lie down on an exam table. 6. Pictures of your heart will be taken for about 15-20 minutes. 7. You may also have a stress test. For this test, one of these things may be done: ? You will be asked to exercise on a treadmill or a stationary bike. ? You will be given medicines that will make your heart work harder. This is done if you are unable to exercise. 8. When blood flow to your heart has peaked, a tracer will again be given through the IV tube. 9. After 20-40 minutes, you will get back on the exam table. More pictures will be taken of your heart. 10. Depending on the tracer that is used, more pictures may need to be taken 3-4 hours later. 11. Your IV tube will be  removed when the test is over. The test may vary among doctors and hospitals. What happens after the test? 1. Ask your doctor: ? Whether you can return to your normal schedule, including diet, activities, and medicines. ? Whether you should drink more fluids. This will help to remove the tracer from your body. Drink enough fluid to keep your pee (urine) pale yellow. 2. Ask your doctor, or the department that is doing the test: ? When will my results be ready? ? How will I get my results? Summary  A cardiac nuclear scan is a test that is done to check the flow of blood to your heart.  Tell your doctor whether you are pregnant or may be pregnant.  Before the test, ask your doctor about changing or stopping your normal medicines. This is important.  Ask your doctor whether you can return to your normal activities. You may be asked to drink more fluids. This information is not intended to replace advice given to you by your health care provider. Make sure you discuss any questions you have with your health care provider. Document Revised: 12/07/2018 Document Reviewed: 01/31/2018 Elsevier Patient Education  2020 [employer].  Echocardiogram An echocardiogram is a procedure that uses painless sound waves (ultrasound) to produce an image of the heart. Images from an echocardiogram can provide important information about:  Signs of coronary artery disease (CAD).  Aneurysm detection. An aneurysm is a weak or damaged part of an artery wall that bulges out from the normal force of blood pumping through the body.  Heart size and shape. Changes in the size or shape of the heart can be associated with certain conditions, including heart failure, aneurysm, and CAD.  Heart muscle function.  Heart valve function.  Signs of a past heart attack.  Fluid buildup around the heart.  Thickening of the heart muscle.  A tumor or infectious growth around the heart valves. Tell a health care provider  about:  Any allergies you have.  All medicines you are taking, including vitamins, herbs, eye drops, creams, and over-the-counter medicines.  Any blood disorders you have.  Any surgeries you have had.  Any medical conditions you have.  Whether you are pregnant or may be pregnant. What are the risks? Generally, this is a safe procedure. However, problems may occur, including:  Allergic reaction to dye (contrast) that may be used during the procedure. What happens before the procedure? No specific preparation is needed. You may eat and drink normally.  What happens during the procedure?    An IV tube may be inserted into one of your veins.  You may receive contrast through this tube. A contrast is an injection that improves the quality of the pictures from your heart.  A gel will be applied to your chest.  A wand-like tool (transducer) will be moved over your chest. The gel will help to transmit the sound waves from the transducer.  The sound waves will harmlessly bounce off of your heart to allow the heart images to be captured in real-time motion. The images will be recorded on a computer. The procedure may vary among health care providers and hospitals. What happens after the procedure?  You may return to your normal, everyday life, including diet, activities, and medicines, unless your health care provider tells you not to do that. Summary  An echocardiogram is a procedure that uses painless sound waves (ultrasound) to produce an image of the heart.  Images from an echocardiogram can provide important information about the size and shape of your heart, heart muscle function, heart valve function, and fluid buildup around your heart.  You do not need to do anything to prepare before this procedure. You may eat and drink normally.  After the echocardiogram is completed, you may return to your normal, everyday life, unless your health care provider tells you not to do  that. This information is not intended to replace advice given to you by your health care provider. Make sure you discuss any questions you have with your health care provider. Document Revised: 12/08/2018 Document Reviewed: 09/19/2016 Elsevier Patient Education  2020 Elsevier Inc.  Rosuvastatin Tablets What is this medicine? ROSUVASTATIN (roe SOO va sta tin) is known as a HMG-CoA reductase inhibitor or 'statin'. It lowers cholesterol and triglycerides in the blood. This drug may also reduce the risk of heart attack, stroke, or other health problems in patients with risk factors for heart disease. Diet and lifestyle changes are often used with this drug. This medicine may be used for other purposes; ask your health care provider or pharmacist if you have questions. COMMON BRAND NAME(S): Crestor What should I tell my health care provider before I take this medicine? They need to know if you have any of these conditions:  diabetes  if you often drink alcohol  history of stroke  kidney disease  liver disease  muscle aches or weakness  thyroid disease  an unusual or allergic reaction to rosuvastatin, other medicines, foods, dyes, or preservatives  pregnant or trying to get pregnant  breast-feeding How should I use this medicine? Take this medicine by mouth with a glass of water. Follow the directions on the prescription label. Do not cut, crush or chew this medicine. You can take this medicine with or without food. Take your doses at regular intervals. Do not take your medicine more often than directed. Talk to your pediatrician regarding the use of this medicine in children. While this drug may be prescribed for children as young as 63 years old for selected conditions, precautions do apply. Overdosage: If you think you have taken too much of this medicine contact a poison control center or emergency room at once. NOTE: This medicine is only for you. Do not share this medicine with  others. What if I miss a dose? If you miss a dose, take it as soon as you can. If your next dose is to be taken in less than 12 hours, then do not take the missed  dose. Take the next dose at your regular time. Do not take double or extra doses. What may interact with this medicine? Do not take this medicine with any of the following medications:  herbal medicines like red yeast rice This medicine may also interact with the following medications:  alcohol  antacids containing aluminum hydroxide or magnesium hydroxide  cyclosporine  other medicines for high cholesterol  some medicines for HIV infection  warfarin This list may not describe all possible interactions. Give your health care provider a list of all the medicines, herbs, non-prescription drugs, or dietary supplements you use. Also tell them if you smoke, drink alcohol, or use illegal drugs. Some items may interact with your medicine. What should I watch for while using this medicine? Visit your doctor or health care professional for regular check-ups. You may need regular tests to make sure your liver is working properly. Your health care professional may tell you to stop taking this medicine if you develop muscle problems. If your muscle problems do not go away after stopping this medicine, contact your health care professional. Do not become pregnant while taking this medicine. Women should inform their health care professional if they wish to become pregnant or think they might be pregnant. There is a potential for serious side effects to an unborn child. Talk to your health care professional or pharmacist for more information. Do not breast-feed an infant while taking this medicine. This medicine may increase blood sugar. Ask your healthcare provider if changes in diet or medicines are needed if you have diabetes. If you are going to need surgery or other procedure, tell your doctor that you are using this medicine. This drug  is only part of a total heart-health program. Your doctor or a dietician can suggest a low-cholesterol and low-fat diet to help. Avoid alcohol and smoking, and keep a proper exercise schedule. This medicine may cause a decrease in Co-Enzyme Q-10. You should make sure that you get enough Co-Enzyme Q-10 while you are taking this medicine. Discuss the foods you eat and the vitamins you take with your health care professional. What side effects may I notice from receiving this medicine? Side effects that you should report to your doctor or health care professional as soon as possible:  allergic reactions like skin rash, itching or hives, swelling of the face, lips, or tongue  confusion  joint pain  loss of memory  redness, blistering, peeling or loosening of the skin, including inside the mouth  signs and symptoms of high blood sugar such as being more thirsty or hungry or having to urinate more than normal. You may also feel very tired or have blurry vision.  signs and symptoms of muscle injury like dark urine; trouble passing urine or change in the amount of urine; unusually weak or tired; muscle pain or side or back pain  yellowing of the eyes or skin Side effects that usually do not require medical attention (report to your doctor or health care professional if they continue or are bothersome):  constipation  diarrhea  dizziness  gas  headache  nausea  stomach pain  trouble sleeping  upset stomach This list may not describe all possible side effects. Call your doctor for medical advice about side effects. You may report side effects to FDA at 1-800-FDA-1088. Where should I keep my medicine? Keep out of the reach of children and pets. Store between 20 and 25 degrees C (68 and 77 degrees F). Get rid of any unused  medicine after the expiration date. To get rid of medicines that are no longer needed or have expired:  Take the medicine to a medicine take-back program. Check  with your pharmacy or law enforcement to find a location.  If you cannot return the medicine, check the label or package insert to see if the medicine should be thrown out in the garbage or flushed down the toilet. If you are not sure, ask your health care provider. If it is safe to put it in the trash, take the medicine out of the container. Mix the medicine with cat litter, dirt, coffee grounds, or other unwanted substance. Seal the mixture in a bag or container. Put it in the trash. NOTE: This sheet is a summary. It may not cover all possible information. If you have questions about this medicine, talk to your doctor, pharmacist, or health care provider.  2021 Elsevier/Gold Standard (2020-06-30 09:55:07)

## 2020-09-18 NOTE — Addendum Note (Signed)
Addended by: Eleonore Chiquito on: 09/18/2020 09:14 AM   Modules accepted: Orders

## 2020-09-18 NOTE — Telephone Encounter (Signed)
Patient given detailed instructions per Myocardial Perfusion Study Information Sheet for the test on 09/24/20 at 11:00. Patient notified to arrive 15 minutes early and that it is imperative to arrive on time for appointment to keep from having the test rescheduled.  If you need to cancel or reschedule your appointment, please call the office within 24 hours of your appointment. . Patient verbalized understanding.Leslie Franklin    

## 2020-09-18 NOTE — Telephone Encounter (Signed)
Left message on voicemail in reference to upcoming appointment scheduled for 09/24/20. Phone number given for a call back so details instructions can be given.

## 2020-09-19 LAB — IGP, APTIMA HPV, RFX 16/18,45
HPV Aptima: NEGATIVE
PAP Smear Comment: 0

## 2020-09-20 NOTE — Progress Notes (Signed)
Depression screening copied from paper assessment in office on 09/17/20  09/20/20 1632  PHQ 2 & 9 Depression Scale- Over the past 2 weeks, how often have you been bothered by any of the following problems?  Little interest or pleasure in doing things 3  Feeling down, depressed, or hopeless (PHQ Adolescent also includes...irritable) 2  PHQ-2 Total Score 5  Trouble falling or staying asleep, or sleeping too much 3  Feeling tired or having little energy 3  Poor appetite or overeating (PHQ Adolescent also includes...weight loss) 3  Feeling bad about yourself - or that you are a failure or have let yourself or your family down 0  Trouble concentrating on things, such as reading the newspaper or watching television (PHQ Adolescent also includes...like school work) 3  Moving or speaking so slowly that other people could have noticed. Or the opposite - being so fidgety or restless that you have been moving around a lot more than usual 0  Thoughts that you would be better off dead, or of hurting yourself in some way 0  PHQ-9 Total Score 17

## 2020-09-24 ENCOUNTER — Other Ambulatory Visit: Payer: Self-pay

## 2020-09-24 ENCOUNTER — Ambulatory Visit (INDEPENDENT_AMBULATORY_CARE_PROVIDER_SITE_OTHER): Payer: 59

## 2020-09-24 DIAGNOSIS — R55 Syncope and collapse: Secondary | ICD-10-CM | POA: Diagnosis not present

## 2020-09-24 LAB — MYOCARDIAL PERFUSION IMAGING
LV dias vol: 53 mL (ref 46–106)
LV sys vol: 19 mL
Peak HR: 105 {beats}/min
Rest HR: 73 {beats}/min
SDS: 2
SRS: 2
SSS: 4
TID: 1.05

## 2020-09-24 MED ORDER — TECHNETIUM TC 99M TETROFOSMIN IV KIT
31.6000 | PACK | Freq: Once | INTRAVENOUS | Status: AC | PRN
  Administered 2020-09-24: 31.6 via INTRAVENOUS

## 2020-09-24 MED ORDER — TECHNETIUM TC 99M TETROFOSMIN IV KIT
10.5000 | PACK | Freq: Once | INTRAVENOUS | Status: AC | PRN
  Administered 2020-09-24: 10.5 via INTRAVENOUS

## 2020-09-24 MED ORDER — REGADENOSON 0.4 MG/5ML IV SOLN
0.4000 mg | Freq: Once | INTRAVENOUS | Status: AC
  Administered 2020-09-24: 0.4 mg via INTRAVENOUS

## 2020-10-01 ENCOUNTER — Encounter: Payer: Self-pay | Admitting: Nurse Practitioner

## 2020-10-01 ENCOUNTER — Ambulatory Visit (INDEPENDENT_AMBULATORY_CARE_PROVIDER_SITE_OTHER): Payer: 59 | Admitting: Nurse Practitioner

## 2020-10-01 ENCOUNTER — Other Ambulatory Visit: Payer: Self-pay

## 2020-10-01 VITALS — BP 122/68 | HR 72 | Temp 97.5°F | Ht 63.0 in | Wt 121.0 lb

## 2020-10-01 DIAGNOSIS — R87615 Unsatisfactory cytologic smear of cervix: Secondary | ICD-10-CM | POA: Diagnosis not present

## 2020-10-01 NOTE — Progress Notes (Signed)
 Established Patient Office Visit  Subjective:  Patient ID: Leslie Franklin, female    DOB: 03/11/1961  Age: 59 y.o. MRN: [id number]  CC:  Chief Complaint  Patient presents with  . Repeat pap smear    HPI Leslie Franklin presents for repeat pap smear. She had pap smear on 09/21/2020 that had insufficient cells for evaluation. Leslie Franklin states last had pap smear approximately 15 years ago that was abnormal, requiring LEEP procedure. She did not follow-up afterwards. She tells me her daughter was diagnosed with breast cancer at age 37. She states her daughter had positive BRCA2 genetic testing for hereditary breast and ovarian cancer. Leslie Franklin states she was negative for BRCA2 gene.   Past Medical History:  Diagnosis Date  . Anxiety 09/13/2020  . Claudication (HCC) 09/12/2020  . COPD (chronic obstructive pulmonary disease) (HCC)   . Depression   . Hyperlipidemia   . Screening mammogram for breast cancer 09/12/2020  . Syncope 09/12/2020    Past Surgical History:  Procedure Laterality Date  . NO PAST SURGERIES      Family History  Problem Relation Age of Onset  . Hypertension Mother   . Diabetes Mother   . Heart attack Father   . Emphysema Sister   . Stroke Brother     Social History   Socioeconomic History  . Marital status: Single    Spouse name: Not on file  . Number of children: 4  . Years of education: Not on file  . Highest education level: Not on file  Occupational History  . Not on file  Tobacco Use  . Smoking status: Former Smoker    Packs/day: 1.00    Years: 41.00    Pack years: 41.00  . Smokeless tobacco: Never Used  Substance and Sexual Activity  . Alcohol use: Never  . Drug use: Never  . Sexual activity: Not on file  Other Topics Concern  . Not on file  Social History Narrative  . Not on file   Social Determinants of Health   Financial Resource Strain: Not on file  Food Insecurity: Not on file  Transportation Needs: Not on file  Physical Activity: Not on file   Stress: Not on file  Social Connections: Not on file  Intimate Partner Violence: Not on file    Outpatient Medications Prior to Visit  Medication Sig Dispense Refill  . hydrOXYzine (ATARAX/VISTARIL) 25 MG tablet Take 1 tablet (25 mg total) by mouth 3 (three) times daily as needed. 90 tablet 1  . rosuvastatin (CRESTOR) 20 MG tablet Take 1 tablet (20 mg total) by mouth daily. 90 tablet 3  . traZODone (DESYREL) 50 MG tablet Take 1 tablet (50 mg total) by mouth at bedtime. 30 tablet 3   No facility-administered medications prior to visit.    No Known Allergies  ROS Review of Systems  Constitutional: Negative.   Psychiatric/Behavioral: Positive for decreased concentration.  All other systems reviewed and are negative.     Objective:    Physical Exam Vitals reviewed.  Constitutional:      Appearance: She is well-developed, normal weight and well-nourished.  HENT:     Head: Normocephalic.  Cardiovascular:     Rate and Rhythm: Normal rate and regular rhythm.     Pulses: Normal pulses.     Heart sounds: Normal heart sounds.  Pulmonary:     Effort: Pulmonary effort is normal.     Breath sounds: Normal breath sounds.  Genitourinary:    General: Normal vulva.       Exam position: Lithotomy position.     Pubic Area: No rash.      Vagina: No vaginal discharge or erythema.     Cervix: Normal.     Rectum: Normal.  Skin:    General: Skin is warm and dry.     Capillary Refill: Capillary refill takes less than 2 seconds.  Neurological:     General: No focal deficit present.     Mental Status: She is alert and oriented to person, place, and time.  Psychiatric:        Mood and Affect: Mood and affect and mood normal.        Behavior: Behavior normal.     BP 122/68 (BP Location: Left Arm, Patient Position: Sitting)   Pulse 72   Temp (!) 97.5 F (36.4 C) (Temporal)   Ht 5' 3" (1.6 m)   Wt 121 lb (54.9 kg)   SpO2 98%   BMI 21.43 kg/m  Wt Readings from Last 3 Encounters:   10/01/20 121 lb (54.9 kg)  09/24/20 124 lb (56.2 kg)  09/18/20 124 lb (56.2 kg)     Health Maintenance Due  Topic Date Due  . Hepatitis C Screening  Never done  . HIV Screening  Never done  . TETANUS/TDAP  Never done  . COLONOSCOPY (Pts 45-49yrs Insurance coverage will need to be confirmed)  Never done  . MAMMOGRAM  Never done  . INFLUENZA VACCINE  Never done      Lab Results  Component Value Date   TSH 1.220 09/13/2020   Lab Results  Component Value Date   WBC 7.8 09/13/2020   HGB 14.9 09/13/2020   HCT 44.7 09/13/2020   MCV 88 09/13/2020   PLT 334 09/13/2020   Lab Results  Component Value Date   NA 137 09/13/2020   K 4.6 09/13/2020   CO2 23 09/13/2020   GLUCOSE 92 09/13/2020   BUN 17 09/13/2020   CREATININE 1.03 (H) 09/13/2020   BILITOT 0.5 09/13/2020   ALKPHOS 111 09/13/2020   AST 19 09/13/2020   ALT 12 09/13/2020   PROT 6.7 09/13/2020   ALBUMIN 4.2 09/13/2020   CALCIUM 9.7 09/13/2020   Lab Results  Component Value Date   CHOL 292 (H) 09/13/2020   Lab Results  Component Value Date   HDL 51 09/13/2020   Lab Results  Component Value Date   LDLCALC 218 (H) 09/13/2020   Lab Results  Component Value Date   TRIG 128 09/13/2020   Lab Results  Component Value Date   CHOLHDL 5.7 (H) 09/13/2020       Assessment & Plan:   1. Pap smear of cervix unsatisfactory - IGP, Aptima HPV, rfx 16/18,45       Problem List Items Addressed This Visit      Visit Diagnoses    Pap smear of cervix unsatisfactory    -  Primary   Relevant Orders   IGP, Aptima HPV, rfx 16/18,45         Follow-up: 3-months    Signed, Shannon Heaton, DNP October 01, 2020 at 11:43 a.m.   

## 2020-10-02 NOTE — Progress Notes (Signed)
Patient needs an appointment please either with myself or Carollee Herter in within the week.  Please let me know who you schedule her with. kc

## 2020-10-04 LAB — IGP, APTIMA HPV, RFX 16/18,45
HPV Aptima: NEGATIVE
PAP Smear Comment: 0

## 2020-10-10 ENCOUNTER — Encounter: Payer: Self-pay | Admitting: Nurse Practitioner

## 2020-10-10 ENCOUNTER — Other Ambulatory Visit: Payer: Self-pay

## 2020-10-10 ENCOUNTER — Ambulatory Visit (INDEPENDENT_AMBULATORY_CARE_PROVIDER_SITE_OTHER): Payer: 59 | Admitting: Nurse Practitioner

## 2020-10-10 VITALS — BP 96/52 | HR 86 | Temp 97.4°F | Ht 63.0 in | Wt 120.0 lb

## 2020-10-10 DIAGNOSIS — R413 Other amnesia: Secondary | ICD-10-CM

## 2020-10-10 DIAGNOSIS — R270 Ataxia, unspecified: Secondary | ICD-10-CM | POA: Diagnosis not present

## 2020-10-10 DIAGNOSIS — R55 Syncope and collapse: Secondary | ICD-10-CM | POA: Diagnosis not present

## 2020-10-10 DIAGNOSIS — F331 Major depressive disorder, recurrent, moderate: Secondary | ICD-10-CM

## 2020-10-10 DIAGNOSIS — R296 Repeated falls: Secondary | ICD-10-CM

## 2020-10-10 DIAGNOSIS — R519 Headache, unspecified: Secondary | ICD-10-CM

## 2020-10-10 DIAGNOSIS — G8929 Other chronic pain: Secondary | ICD-10-CM

## 2020-10-10 MED ORDER — SERTRALINE HCL 50 MG PO TABS: 50.0000 mg | ORAL_TABLET | Freq: Every day | ORAL | 0 refills | Status: DC

## 2020-10-10 NOTE — Progress Notes (Signed)
Subjective:  Patient ID: Leslie Franklin, female    DOB: [birth year]  Age: 60 y.o. MRN: [id number]  Chief Complaint  Patient presents with  . Follow up with Syncope    HPI  Leslie Franklin is a 60 year old Caucasian female that presents for follow-up of syncope. She states she has experienced several episodes of syncope and ataxia for four years. She has experienced multiple falls related to dizziness. She states she quit working in July 2021 due to multiple falls, dizziness, and syncope. She tells me she has experienced increased memory loss; states her children have noted a decline in memory over the past few years. She states falls have decreased now that she is not working because she sits down immediately when she experiences dizzinnes.She states when she experiences the episodes she has severe pain to the left side of head. She was evaluated by cardiologist Dr Tomie China on 09/18/20 who has recommended follow-up of ataxia.   Kasi has a history of depression. She was previously prescribe Sertraline 100 mg daily by [facility]. She states when she obtained insurance she was no longer allowed to be a patient at [facility]. She tells me she has not had Sertraline in approximately 50-months. She has requested to resume this medication. PHQ-9 in office score 13 today.  Current Outpatient Medications on File Prior to Visit  Medication Sig Dispense Refill  . hydrOXYzine (ATARAX/VISTARIL) 25 MG tablet Take 1 tablet (25 mg total) by mouth 3 (three) times daily as needed. 90 tablet 1  . rosuvastatin (CRESTOR) 20 MG tablet Take 1 tablet (20 mg total) by mouth daily. 90 tablet 3  . traZODone (DESYREL) 50 MG tablet Take 1 tablet (50 mg total) by mouth at bedtime. 30 tablet 3   No current facility-administered medications on file prior to visit.   Past Medical History:  Diagnosis Date  . Anxiety 09/13/2020  . Claudication (HCC) 09/12/2020  . COPD (chronic obstructive pulmonary disease) (HCC)   . Depression   . Hyperlipidemia    . Screening mammogram for breast cancer 09/12/2020  . Syncope 09/12/2020   Past Surgical History:  Procedure Laterality Date  . NO PAST SURGERIES      Family History  Problem Relation Age of Onset  . Hypertension Mother   . Diabetes Mother   . Heart attack Father   . Emphysema Sister   . Stroke Brother    Social History   Socioeconomic History  . Marital status: Single    Spouse name: Not on file  . Number of children: 4  . Years of education: Not on file  . Highest education level: Not on file  Occupational History  . Not on file  Tobacco Use  . Smoking status: Former Smoker    Packs/day: 1.00    Years: 41.00    Pack years: 41.00  . Smokeless tobacco: Never Used  Substance and Sexual Activity  . Alcohol use: Never  . Drug use: Never  . Sexual activity: Not on file  Other Topics Concern  . Not on file  Social History Narrative  . Not on file   Social Determinants of Health   Financial Resource Strain: Not on file  Food Insecurity: Not on file  Transportation Needs: Not on file  Physical Activity: Not on file  Stress: Not on file  Social Connections: Not on file    Review of Systems  Constitutional: Positive for appetite change (decreased appetite) and fatigue. Negative for fever.  HENT: Positive for ear pain (left). Negative for  congestion, sinus pressure and sore throat.   Eyes: Positive for visual disturbance. Negative for pain.  Respiratory: Positive for shortness of breath (with activity). Negative for cough, chest tightness and wheezing.   Cardiovascular: Negative for chest pain and palpitations.  Gastrointestinal: Positive for nausea. Negative for abdominal pain, constipation, diarrhea and vomiting.  Endocrine: Positive for polydipsia.  Genitourinary: Negative for dysuria and hematuria.       Stress incontinence  Musculoskeletal: Negative for arthralgias, back pain, joint swelling and myalgias.  Skin: Negative for rash.  Neurological: Positive for  dizziness, syncope, light-headedness and headaches. Negative for weakness.       Memory loss  Psychiatric/Behavioral: Positive for dysphoric mood and sleep disturbance. Negative for self-injury and suicidal ideas. The patient is not nervous/anxious.      Objective:  BP (!) 96/52 (BP Location: Left Arm, Patient Position: Sitting)   Pulse 86   Temp (!) 97.4 F (36.3 C) (Temporal)   Ht 5\' 3"  (1.6 m)   Wt 120 lb (54.4 kg)   SpO2 97%   BMI 21.26 kg/m   BP/Weight 10/10/2020 10/01/2020 09/18/2020  Systolic BP 96 122 118  Diastolic BP 52 68 66  Wt. (Lbs) 120 121 124  BMI 21.26 21.43 21.97    Physical Exam Constitutional:      Appearance: Normal appearance.  HENT:     Head: Normocephalic.     Right Ear: Tympanic membrane normal.     Left Ear: Tympanic membrane normal.     Nose: Nose normal.     Mouth/Throat:     Mouth: Mucous membranes are moist.  Eyes:     General: Visual field deficit present.  Cardiovascular:     Rate and Rhythm: Normal rate and regular rhythm.     Pulses: Normal pulses.     Heart sounds: Normal heart sounds.  Pulmonary:     Effort: Pulmonary effort is normal.     Breath sounds: Normal breath sounds.  Abdominal:     Palpations: Abdomen is soft.  Musculoskeletal:     Cervical back: Normal range of motion.  Skin:    General: Skin is warm and dry.     Capillary Refill: Capillary refill takes less than 2 seconds.  Neurological:     Mental Status: She is alert and oriented to person, place, and time.     Cranial Nerves: No facial asymmetry.     Motor: No tremor, seizure activity or pronator drift.     Coordination: Heel to Shin Test abnormal. Finger-Nose-Finger Test normal.     Gait: Gait abnormal and tandem walk abnormal.     Deep Tendon Reflexes:     Reflex Scores:      Patellar reflexes are 2+ on the right side and 2+ on the left side.      Achilles reflexes are 2+ on the right side and 2+ on the left side. Psychiatric:        Mood and Affect: Mood  normal.        Behavior: Behavior normal.        Thought Content: Thought content normal.        Judgment: Judgment normal.         Lab Results  Component Value Date   WBC 7.8 09/13/2020   HGB 14.9 09/13/2020   HCT 44.7 09/13/2020   PLT 334 09/13/2020   GLUCOSE 92 09/13/2020   CHOL 292 (H) 09/13/2020   TRIG 128 09/13/2020   HDL 51 09/13/2020   LDLCALC 218 (  H) 09/13/2020   ALT 12 09/13/2020   AST 19 09/13/2020   NA 137 09/13/2020   K 4.6 09/13/2020   CL 99 09/13/2020   CREATININE 1.03 (H) 09/13/2020   BUN 17 09/13/2020   CO2 23 09/13/2020   TSH 1.220 09/13/2020      Assessment & Plan:   1. Syncope, unspecified syncope type - MR BRAIN W CONTRAST  2. Ataxia - MR BRAIN W CONTRAST  3. Memory loss - MR BRAIN W CONTRAST  4. Moderate episode of recurrent major depressive disorder (HCC) - sertraline (ZOLOFT) 50 MG tablet; Take 1 tablet (50 mg total) by mouth daily.  Dispense: 90 tablet; Refill: 0  5. Frequent falls - MR BRAIN W CONTRAST  6. Chronic nonintractable headache, unspecified headache type - MR BRAIN W CONTRAST    Resume Sertraline 50 mg daily for depression We will call you with appt for MRI brain Keep all appointments as scheduled Follow-up on April 19,2022 as scheduled   Follow-up: April 19th, 2022 at 9:15, fasting  An After Visit Summary was printed and given to the patient.  [name], NP [facility] [phone]

## 2020-10-10 NOTE — Patient Instructions (Addendum)
Resume Sertraline 50 mg daily for depression We will call you with appt for MRI brain Keep all appointments as scheduled Follow-up on April 19,2022 as scheduled  Syncope Syncope is when you pass out (faint) for a short time. It is caused by a sudden decrease in blood flow to the brain. Signs that you may be about to pass out include:  Feeling dizzy or light-headed.  Feeling sick to your stomach (nauseous).  Seeing all white or all black.  Having cold, clammy skin. If you pass out, get help right away. Call your local emergency services (911 in the [place] Do not drive yourself to the hospital. Follow these instructions at home: Watch for any changes in your symptoms. Take these actions to stay safe and help with your symptoms: Lifestyle  Do not drive, use machinery, or play sports until your doctor says it is okay.  Do not drink alcohol.  Do not use any products that contain nicotine or tobacco, such as cigarettes and e-cigarettes. If you need help quitting, ask your doctor.  Drink enough fluid to keep your pee (urine) pale yellow. General instructions  Take over-the-counter and prescription medicines only as told by your doctor.  If you are taking blood pressure or heart medicine, sit up and stand up slowly. Spend a few minutes getting ready to sit and then stand. This can help you feel less dizzy.  Have someone stay with you until you feel stable.  If you start to feel like you might pass out, lie down right away and raise (elevate) your feet above the level of your heart. Breathe deeply and steadily. Wait until all of the symptoms are gone.  Keep all follow-up visits as told by your doctor. This is important. Get help right away if:  You have a very bad headache.  You pass out once or more than once.  You have pain in your chest, belly, or back.  You have a very fast or uneven heartbeat (palpitations).  It hurts to breathe.  You are bleeding from your mouth or your  bottom (rectum).  You have black or tarry poop (stool).  You have jerky movements that you cannot control (seizure).  You are confused.  You have trouble walking.  You are very weak.  You have vision problems. These symptoms may be an emergency. Do not wait to see if the symptoms will go away. Get medical help right away. Call your local emergency services (911 in the [place] Do not drive yourself to the hospital. Summary  Syncope is when you pass out (faint) for a short time. It is caused by a sudden decrease in blood flow to the brain.  Signs that you may be about to faint include feeling dizzy, light-headed, or sick to your stomach, seeing all white or all black, or having cold, clammy skin.  If you start to feel like you might pass out, lie down right away and raise (elevate) your feet above the level of your heart. Breathe deeply and steadily. Wait until all of the symptoms are gone. This information is not intended to replace advice given to you by your health care provider. Make sure you discuss any questions you have with your health care provider. Document Revised: 09/27/2019 Document Reviewed: 09/29/2017 Elsevier Patient Education  2021 [employer]  Ataxia  Ataxia is a condition that causes unsteadiness when walking and standing, poor coordination of body movements, and difficulty keeping a straight (upright) posture. It occurs because of a problem  with the part of the brain that controls coordination and stability (cerebellum). Ataxia can develop later in life (acquired ataxia), during your 20s or 30s or even into your 60s or later. This type of ataxia develops when another medical condition, such as a stroke, damages the cerebellum. Ataxia also may be present early in life (non-acquired ataxia). There are two main types of non-acquired ataxia:  Congenital. This type is present at birth.  Hereditary. This type is passed from parent to child. The most common form of  hereditary non-acquired ataxia is Friedreich ataxia. What are the causes? Acquired ataxia may be caused by:  Changes in the nervous system (neurodegenerative changes).  Changes throughout the body (systemic disorders).  A lot of exposure to: ? Certain medicines such as phenytoin and lithium. ? Solvents. These are cleaning fluids such as paint thinner, nail polish remover, carpet cleaner, and degreasers.  Alcohol abuse (alcoholism).  Medical conditions, such as: ? Celiac disease. ? Hypothyroidism. ? A lack (deficiency) of vitamin E, vitamin B12, or thiamine. ? Brain tumors. ? Multiple sclerosis. ? Cerebral palsy. ? Stroke. ? Paraneoplastic syndromes. ? Viral infections. ? Head injury. ? Malnutrition. Congenital and hereditary ataxia are caused by problems that are present in genes before birth. What are the signs or symptoms? Signs and symptoms of ataxia vary depending on the cause. They may include:  Being unsteady.  Walking with the legs wide apart (wide stance) to keep one's balance.  Uncontrolled shaking (tremor).  Poorly coordinated body movements.  Difficulty maintaining an upright posture.  Fatigue.  Changes in speech.  Changes in vision.  Involuntary eye movements (nystagmus).  Difficulty swallowing.  Difficulty writing.  Muscle tightening that you cannot control (muscle spasms). How is this diagnosed? Ataxia may be diagnosed based on:  Your personal and family medical history.  A physical exam.  Imaging tests, such as a CT scan or MRI.  Spinal tap (lumbar puncture). This procedure involves using a needle to take a sample of the fluid around your brain and spinal cord.  Genetic testing. How is this treated? The underlying condition that causes your ataxia needs to be treated. If the cause is a brain tumor, you may need surgery. Treatment also focuses on helping you live with ataxia and improving your quality of life (supportive treatments).  This may involve:  Learning ways to improve coordination and move around more carefully (physical therapy).  Learning ways to improve your ability to do daily tasks, such as bathing and feeding yourself (occupational therapy).  Using devices to help you move around, eat, or communicate (assistive devices), such as a walker, modified eating utensils, and communication aids.  Learning ways to improve speech and swallowing (speech therapy). Follow these instructions at home: Preventing falls  Lie down right away if you become very unsteady, dizzy, or nauseous, or if you feel like you are going to faint. Do not get up until all of those feelings pass.  Keep your home well-lit. Use night-lights as needed.  Remove tripping hazards, such as rugs, cords, and clutter.  Install grab bars by the toilet and in the tub and shower.  Use assistive devices such as a cane, walker, or wheelchair as needed to keep your balance. General instructions  Do not drink alcohol.  Ask your health care provider what activities are safe for you, and what activities you should avoid.  Take over-the-counter and prescription medicines only as told by your health care provider. Get help right away if you:  Have  unsteadiness that suddenly worsens.  Have any of these: ? Severe headaches. ? Chest pain. ? Abdominal pain. ? Weakness or numbness on one side of your body. ? Vision problems. ? Difficulty speaking. ? An irregular heartbeat. ? A very fast pulse.  Feel confused. Summary  Ataxia is a condition that causes unsteadiness when walking and standing, poor coordination of body movements, and difficulty keeping a straight (upright) posture.  Ataxia occurs because of a problem with the part of the brain that controls coordination and stability (cerebellum).  The underlying condition that causes your ataxia needs to be treated. Treatment also focuses on helping you live with ataxia and improving your  quality of life (supportive treatments).  Lie down right away if you become very unsteady, dizzy, or nauseous, or if you feel like you are going to faint. This information is not intended to replace advice given to you by your health care provider. Make sure you discuss any questions you have with your health care provider. Document Revised: 05/29/2020 Document Reviewed: 06/18/2017 Elsevier Patient Education  2021 [employer].  Magnetic Resonance Imaging Magnetic resonance imaging (MRI) is a painless test that takes pictures of the inside of your body. This test uses a strong magnet. It does not use X-rays. An MRI can show more details about a medical problem than other tests. Tell a health care provider about:  Any allergies you have.  All medicines you are taking. This includes vitamins, herbs, eye drops, creams, and over-the-counter medicines.  Any surgeries you have had.  Any medical problems you have.  Any metal you may have in your body. This includes: ? Any new joint, such as a man-made (artificial) knee or hip. ? Any implanted devices, such as a pacemaker. ? An ear implant with metal (cochlear implant). ? An artificial heart valve. ? An object in your eye that has metal. ? Metal splinters. ? Pieces of a bullet. ? A port for insulin or chemotherapy.  Any tattoos you have.  If you have a birth control implant, such as an IUD.  Whether you are pregnant, may be pregnant, or are breastfeeding.  Any fear of small spaces (claustrophobia). You may be given a medicine to help you relax. What are the risks? Generally, this is a safe test. However, problems may occur. These include:  If you have metal in your body near the area being tested, it may be hard to get clear pictures.  If you are pregnant, you should avoid MRI tests during the first three months of pregnancy.  If you are breastfeeding and dye will be used during your test, you may need to stop until the dye leaves  your body.  If dye is used, there is a risk of an allergic reaction to the dye. You can take medicines to prevent this reaction or to treat it if you have symptoms.  If dye is used, it can cause damage to your kidneys. Drinking plenty of water before and after the test can help prevent this. What happens before the test?  You will be asked to take off all metal. This includes: ? Your watch, jewelry, and other metal things. ? Hearing aids. ? Dentures. ? An underwire bra. ? Makeup.  Braces and dental fillings normally are not a problem.  If you are breastfeeding, ask your doctor if you need to pump before your test. You may need to stop breastfeeding for a time if dye will be used. What happens during the test?  You may be given earplugs or headphones to listen to music. The MRI machine can be noisy.  You will lie down on a table.  If dye will be used, an IV tube will be placed into one of your veins. Dye will be given through your IV tube.  The table will slide into a tunnel. The tunnel has magnets inside of it. When you are inside the tunnel, you will still be able to talk to your doctor.  The tunnel will scan your body and make images. You will be asked to lie very still. Your doctor will tell you when you can move.  When all images are taken, the table will slide out of the tunnel. The test can take 30 minutes to over an hour. The test may vary among doctors and hospitals.   What can I expect after test?  If you were given a medicine to help you relax, you may be monitored until you leave the hospital or clinic. This includes checking your blood pressure, heart rate, breathing rate, and blood oxygen level.  If dye was used: ? It will leave your body through your pee (urine). This takes about a day. ? You may be told to drink plenty of fluids. This helps your body get rid of the dye. ? Do not breastfeed your child until your doctor says that this is safe. Follow these  instructions at home:  You may go back to your normal activities right away, or as told by your doctor.  It is up to you to get your test results. Ask how to get your results when they are ready.  Keep all follow-up visits. Summary  Magnetic resonance imaging (MRI) is a painless test that takes pictures of the inside of your body.  Dye may be used to get MRI pictures that are even more clear.  Before your MRI, be sure to tell your doctor about any metal you may have in your body.  Talk with your doctor about what your test results mean. This information is not intended to replace advice given to you by your health care provider. Make sure you discuss any questions you have with your health care provider. Document Revised: 12/20/2019 Document Reviewed: 12/20/2019 Elsevier Patient Education  2021 [employer] Inc.  Major Depressive Disorder, Adult Major depressive disorder is a mental health condition. This disorder affects feelings. It can also affect the body. Symptoms of this condition last most of the day, almost every day, for 2 weeks. This disorder can affect:  Relationships.  Daily activities, such as work and school.  Activities that you normally like to do. What are the causes? The cause of this condition is not known. The disorder is likely caused by a mix of things, including:  Your personality, such as being a shy person.  Your behavior, or how you act toward others.  Your thoughts and feelings.  Too much alcohol or drugs.  How you react to stress.  Health and mental problems that you have had for a long time.  Things that hurt you in the past (trauma).  Big changes in your life, such as divorce. What increases the risk? The following factors may make you more likely to develop this condition:  Having family members with depression.  Being a woman.  Problems in the family.  Low levels of some brain chemicals.  Things that caused you pain as a child,  especially if you lost a parent or were abused.  A lot  of stress in your life, such as from: ? Living without basic needs of life, such as food and shelter. ? Being treated poorly because of race, sex, or religion (discrimination).  Health and mental problems that you have had for a long time. What are the signs or symptoms? The main symptoms of this condition are:  Being sad all the time.  Being grouchy all the time.  Loss of interest in things and activities. Other symptoms include:  Sleeping too much or too little.  Eating too much or too little.  Gaining or losing weight, without knowing why.  Feeling tired or having low energy.  Being restless and weak.  Feeling hopeless, worthless, or guilty.  Trouble thinking clearly or making decisions.  Thoughts of hurting yourself or others, or thoughts of ending your life.  Spending a lot of time alone.  Inability to complete common tasks of daily life. If you have very bad MDD, you may:  Believe things that are not true.  Hear, see, taste, or feel things that are not there.  Have mild depression that lasts for at least 2 years.  Feel very sad and hopeless.  Have trouble speaking or moving. How is this treated? This condition may be treated with:  Talk therapy. This teaches you to know bad thoughts, feelings, and actions and how to change them. ? This can also help you to communicate with others. ? This can be done with members of your family.  Medicines. These can be used to treat worry (anxiety), depression, or low levels of chemicals in the brain.  Lifestyle changes. You may need to: ? Limit alcohol use. ? Limit drug use. ? Get regular exercise. ? Get plenty of sleep. ? Make healthy eating choices. ? Spend more time outdoors.  Brain stimulation. This treatment excites the brain. This is done when symptoms are very bad or have not gotten better with other treatments. Follow these instructions at  home: Activity  Get regular exercise as told.  Spend time outdoors as told.  Make time to do the things you enjoy.  Find ways to deal with stress. Try to: ? Meditate. ? Do deep breathing. ? Spend time in nature. ? Keep a journal.  Return to your normal activities as told by your doctor. Ask your doctor what activities are safe for you. Alcohol and drug use  If you drink alcohol: ? Limit how much you use to:  0-1 drink a day for women.  0-2 drinks a day for men. ? Be aware of how much alcohol is in your drink. In the [place] one drink equals one 12 oz bottle of beer (355 mL), one 5 oz glass of wine (148 mL), or one 1 oz glass of hard liquor (44 mL).  Talk to your doctor about: ? Alcohol use. Alcohol can affect some medicines. ? Any drug use. General instructions  Take over-the-counter and prescription medicines and herbal preparations only as told by your doctor.  Eat a healthy diet.  Get a lot of sleep.  Think about joining a support group. Your doctor may be able to suggest one.  Keep all follow-up visits as told by your doctor. This is important.   Where to find more information:  [employer] Mental Illness: www.nami.org  [place] [employer] of Mental Health: http://www.maynard.net/  American Psychiatric Association: www.psychiatry.org/patients-families/ Contact a doctor if:  Your symptoms get worse.  You get new symptoms. Get help right away if:  You hurt yourself.  You have serious thoughts about hurting yourself or others.  You see, hear, taste, smell, or feel things that are not there. If you ever feel like you may hurt yourself or others, or have thoughts about taking your own life, get help right away. Go to your nearest emergency department or:  Call your local emergency services (911 in the [place]  Call a suicide crisis helpline, such as the National Suicide Prevention Lifeline at (810) 839-24491-215-604-7427. This is open 24 hours a day in the  [place]  Text the Crisis Text Line at (256)879-6183741741 (in the [place] Summary  Major depressive disorder is a mental health condition. This disorder affects feelings. Symptoms of this condition last most of the day, almost every day, for 2 weeks.  The symptoms of this disorder can cause problems with relationships and with daily activities.  There are treatments and support for people who get this disorder. You may need more than one type of treatment.  Get help right away if you have serious thoughts about hurting yourself or others. This information is not intended to replace advice given to you by your health care provider. Make sure you discuss any questions you have with your health care provider. Document Revised: 07/29/2019 Document Reviewed: 07/29/2019 Elsevier Patient Education  2021 [employer] Inc[employer] Sertraline Tablets What is this medicine? SERTRALINE (SER tra leen) is used to treat depression. It may also be used to treat obsessive compulsive disorder, panic disorder, post-trauma stress disorder, premenstrual dysphoric disorder (PMDD) or social anxiety. This medicine may be used for other purposes; ask your health care provider or pharmacist if you have questions. COMMON BRAND NAME(S): Zoloft What should I tell my health care provider before I take this medicine? They need to know if you have any of these conditions:  bleeding disorders  bipolar disorder or a family history of bipolar disorder  glaucoma  heart disease  high blood pressure  history of irregular heartbeat  history of low levels of calcium, magnesium, or potassium in the blood  if you often drink alcohol  liver disease  receiving electroconvulsive therapy  seizures  suicidal thoughts, plans, or attempt; a previous suicide attempt by you or a family member  take medicines that treat or prevent blood clots  thyroid disease  an unusual or allergic reaction to sertraline, other medicines, foods, dyes, or  preservatives  pregnant or trying to get pregnant  breast-feeding How should I use this medicine? Take this medicine by mouth with a glass of water. Follow the directions on the prescription label. You can take it with or without food. Take your medicine at regular intervals. Do not take your medicine more often than directed. Do not stop taking this medicine suddenly except upon the advice of your doctor. Stopping this medicine too quickly may cause serious side effects or your condition may worsen. A special MedGuide will be given to you by the pharmacist with each prescription and refill. Be sure to read this information carefully each time. Talk to your pediatrician regarding the use of this medicine in children. While this drug may be prescribed for children as young as 7 years for selected conditions, precautions do apply. Overdosage: If you think you have taken too much of this medicine contact a poison control center or emergency room at once. NOTE: This medicine is only for you. Do not share this medicine with others. What if I miss a dose? If you miss a dose, take it as soon as you can. If it is  almost time for your next dose, take only that dose. Do not take double or extra doses. What may interact with this medicine? Do not take this medicine with any of the following medications:  cisapride  dronedarone  linezolid  MAOIs like Carbex, Eldepryl, Marplan, Nardil, and Parnate  methylene blue (injected into a vein)  pimozide  thioridazine This medicine may also interact with the following medications:  alcohol  amphetamines  aspirin and aspirin-like medicines  certain medicines for depression, anxiety, or psychotic disturbances  certain medicines for fungal infections like ketoconazole, fluconazole, posaconazole, and itraconazole  certain medicines for irregular heart beat like flecainide, quinidine, propafenone  certain medicines for migraine headaches like  almotriptan, eletriptan, frovatriptan, naratriptan, rizatriptan, sumatriptan, zolmitriptan  certain medicines for sleep  certain medicines for seizures like carbamazepine, valproic acid, phenytoin  certain medicines that treat or prevent blood clots like warfarin, enoxaparin, dalteparin  cimetidine  digoxin  diuretics  fentanyl  isoniazid  lithium  NSAIDs, medicines for pain and inflammation, like ibuprofen or naproxen  other medicines that prolong the QT interval (cause an abnormal heart rhythm) like dofetilide  rasagiline  safinamide  supplements like St. John's wort, kava kava, valerian  tolbutamide  tramadol  tryptophan This list may not describe all possible interactions. Give your health care provider a list of all the medicines, herbs, non-prescription drugs, or dietary supplements you use. Also tell them if you smoke, drink alcohol, or use illegal drugs. Some items may interact with your medicine. What should I watch for while using this medicine? Tell your doctor if your symptoms do not get better or if they get worse. Visit your doctor or health care professional for regular checks on your progress. Because it may take several weeks to see the full effects of this medicine, it is important to continue your treatment as prescribed by your doctor. Patients and their families should watch out for new or worsening thoughts of suicide or depression. Also watch out for sudden changes in feelings such as feeling anxious, agitated, panicky, irritable, hostile, aggressive, impulsive, severely restless, overly excited and hyperactive, or not being able to sleep. If this happens, especially at the beginning of treatment or after a change in dose, call your health care professional. Bonita Quin may get drowsy or dizzy. Do not drive, use machinery, or do anything that needs mental alertness until you know how this medicine affects you. Do not stand or sit up quickly, especially if you are  an older patient. This reduces the risk of dizzy or fainting spells. Alcohol may interfere with the effect of this medicine. Avoid alcoholic drinks. Your mouth may get dry. Chewing sugarless gum or sucking hard candy, and drinking plenty of water may help. Contact your doctor if the problem does not go away or is severe. What side effects may I notice from receiving this medicine? Side effects that you should report to your doctor or health care professional as soon as possible:  allergic reactions like skin rash, itching or hives, swelling of the face, lips, or tongue  anxious  black, tarry stools  changes in vision  confusion  elevated mood, decreased need for sleep, racing thoughts, impulsive behavior  eye pain  fast, irregular heartbeat  feeling faint or lightheaded, falls  feeling agitated, angry, or irritable  hallucination, loss of contact with reality  loss of balance or coordination  loss of memory  painful or prolonged erections  restlessness, pacing, inability to keep still  seizures  stiff muscles  suicidal  thoughts or other mood changes  trouble sleeping  unusual bleeding or bruising  unusually weak or tired  vomiting Side effects that usually do not require medical attention (report to your doctor or health care professional if they continue or are bothersome):  change in appetite or weight  change in sex drive or performance  diarrhea  increased sweating  indigestion, nausea  tremors This list may not describe all possible side effects. Call your doctor for medical advice about side effects. You may report side effects to FDA at 1-800-FDA-1088. Where should I keep my medicine? Keep out of the reach of children. Store at room temperature between 15 and 30 degrees C (59 and 86 degrees F). Throw away any unused medicine after the expiration date. NOTE: This sheet is a summary. It may not cover all possible information. If you have questions  about this medicine, talk to your doctor, pharmacist, or health care provider.  2021 Elsevier/Gold Standard (2020-06-27 09:18:23)

## 2020-10-14 ENCOUNTER — Other Ambulatory Visit: Payer: Self-pay | Admitting: Nurse Practitioner

## 2020-10-14 ENCOUNTER — Other Ambulatory Visit: Payer: Self-pay

## 2020-10-14 ENCOUNTER — Ambulatory Visit (INDEPENDENT_AMBULATORY_CARE_PROVIDER_SITE_OTHER): Payer: 59

## 2020-10-14 DIAGNOSIS — J209 Acute bronchitis, unspecified: Secondary | ICD-10-CM

## 2020-10-14 DIAGNOSIS — R55 Syncope and collapse: Secondary | ICD-10-CM

## 2020-10-14 LAB — ECHOCARDIOGRAM COMPLETE
Area-P 1/2: 3.68 cm2
S' Lateral: 2.25 cm

## 2020-10-14 MED ORDER — ALBUTEROL SULFATE HFA 108 (90 BASE) MCG/ACT IN AERS: 2.0000 | INHALATION_SPRAY | Freq: Four times a day (QID) | RESPIRATORY_TRACT | 0 refills | Status: DC | PRN

## 2020-10-14 NOTE — Progress Notes (Signed)
Complete echocardiogram performed.  Jimmy Quinita Kostelecky RDCS, RVT  

## 2020-10-16 ENCOUNTER — Telehealth: Payer: Self-pay

## 2020-10-16 DIAGNOSIS — I440 Atrioventricular block, first degree: Secondary | ICD-10-CM

## 2020-10-16 NOTE — Telephone Encounter (Signed)
Diamond with [employer] called to state that the pt had 4 episodes of 4.6 sec pause due to a high grade AV block. Dates were 1/24, 1/27, 1/28, and 1/30. Report printed from [facility] and give to Dr. Tomie China for review.

## 2020-10-17 NOTE — Addendum Note (Signed)
Addended by: Eleonore Chiquito on: 10/17/2020 11:39 AM   Modules accepted: Orders

## 2020-10-18 ENCOUNTER — Other Ambulatory Visit: Payer: Self-pay | Admitting: *Deleted

## 2020-10-18 ENCOUNTER — Other Ambulatory Visit: Payer: Self-pay

## 2020-10-18 ENCOUNTER — Encounter: Payer: Self-pay | Admitting: Internal Medicine

## 2020-10-18 ENCOUNTER — Other Ambulatory Visit (HOSPITAL_COMMUNITY)
Admission: RE | Admit: 2020-10-18 | Discharge: 2020-10-18 | Disposition: A | Discharge status: Home / Self Care | Payer: 59 | Source: Ambulatory Visit | Attending: Internal Medicine | Admitting: Internal Medicine

## 2020-10-18 ENCOUNTER — Ambulatory Visit (INDEPENDENT_AMBULATORY_CARE_PROVIDER_SITE_OTHER): Payer: 59 | Admitting: Internal Medicine

## 2020-10-18 VITALS — BP 136/74 | HR 62 | Ht 63.0 in | Wt 118.0 lb

## 2020-10-18 DIAGNOSIS — Z01812 Encounter for preprocedural laboratory examination: Secondary | ICD-10-CM | POA: Insufficient documentation

## 2020-10-18 DIAGNOSIS — R55 Syncope and collapse: Secondary | ICD-10-CM | POA: Diagnosis not present

## 2020-10-18 DIAGNOSIS — R001 Bradycardia, unspecified: Secondary | ICD-10-CM | POA: Diagnosis not present

## 2020-10-18 DIAGNOSIS — Z20822 Contact with and (suspected) exposure to covid-19: Secondary | ICD-10-CM | POA: Diagnosis not present

## 2020-10-18 DIAGNOSIS — I739 Peripheral vascular disease, unspecified: Secondary | ICD-10-CM

## 2020-10-18 LAB — BASIC METABOLIC PANEL
BUN/Creatinine Ratio: 14 (ref 9–23)
BUN: 12 mg/dL (ref 6–24)
CO2: 20 mmol/L (ref 20–29)
Calcium: 9.7 mg/dL (ref 8.7–10.2)
Chloride: 100 mmol/L (ref 96–106)
Creatinine, Ser: 0.83 mg/dL (ref 0.57–1.00)
GFR calc Af Amer: 91 mL/min/{1.73_m2} (ref >59)
GFR calc non Af Amer: 79 mL/min/{1.73_m2} (ref >59)
Glucose: 93 mg/dL (ref 65–99)
Potassium: 4.3 mmol/L (ref 3.5–5.2)
Sodium: 138 mmol/L (ref 134–144)

## 2020-10-18 LAB — CBC WITH DIFFERENTIAL/PLATELET
Basophils Absolute: 0.1 10*3/uL (ref 0.0–0.2)
Basos: 1 %
EOS (ABSOLUTE): 0.2 10*3/uL (ref 0.0–0.4)
Eos: 2 %
Hematocrit: 45.4 % (ref 34.0–46.6)
Hemoglobin: 15.2 g/dL (ref 11.1–15.9)
Immature Grans (Abs): 0 10*3/uL (ref 0.0–0.1)
Immature Granulocytes: 0 %
Lymphocytes Absolute: 3 10*3/uL (ref 0.7–3.1)
Lymphs: 31 %
MCH: 29.3 pg (ref 26.6–33.0)
MCHC: 33.5 g/dL (ref 31.5–35.7)
MCV: 88 fL (ref 79–97)
Monocytes Absolute: 0.5 10*3/uL (ref 0.1–0.9)
Monocytes: 5 %
Neutrophils Absolute: 6 10*3/uL (ref 1.4–7.0)
Neutrophils: 61 %
Platelets: 339 10*3/uL (ref 150–450)
RBC: 5.19 x10E6/uL (ref 3.77–5.28)
RDW: 12 % (ref 11.7–15.4)
WBC: 9.8 10*3/uL (ref 3.4–10.8)

## 2020-10-18 NOTE — H&P (View-Only) (Signed)
HPI Leslie Franklin is referred by Dr. Tomie China for evaluation of syncope and CHB. She is a pleasant 48 y woman who has passed out 4 times over the past few years. She has worn a cardiac monitor and had daytime episodes of complete heart block. She has very little warning and she has had documented pauses of over 4 seconds. She has not injured herself and she denies loss of continence or tongue biting.  No Known Allergies   Current Outpatient Medications  Medication Sig Dispense Refill  . albuterol (VENTOLIN HFA) 108 (90 Base) MCG/ACT inhaler Inhale 2 puffs into the lungs every 6 (six) hours as needed for wheezing or shortness of breath. 8 g 0  . hydrOXYzine (ATARAX/VISTARIL) 25 MG tablet Take 1 tablet (25 mg total) by mouth 3 (three) times daily as needed. 90 tablet 1  . rosuvastatin (CRESTOR) 20 MG tablet Take 1 tablet (20 mg total) by mouth daily. 90 tablet 3  . sertraline (ZOLOFT) 50 MG tablet Take 1 tablet (50 mg total) by mouth daily. 90 tablet 0  . traZODone (DESYREL) 50 MG tablet Take 1 tablet (50 mg total) by mouth at bedtime. 30 tablet 3   No current facility-administered medications for this visit.     Past Medical History:  Diagnosis Date  . Anxiety 09/13/2020  . Claudication (HCC) 09/12/2020  . COPD (chronic obstructive pulmonary disease) (HCC)   . Depression   . Hyperlipidemia   . Screening mammogram for breast cancer 09/12/2020  . Syncope 09/12/2020    ROS:   All systems reviewed and negative except as noted in the HPI.   Past Surgical History:  Procedure Laterality Date  . NO PAST SURGERIES       Family History  Problem Relation Age of Onset  . Hypertension Mother   . Diabetes Mother   . Heart attack Father   . Emphysema Sister   . Stroke Brother      Social History   Socioeconomic History  . Marital status: Single    Spouse name: Not on file  . Number of children: 4  . Years of education: Not on file  . Highest education level: Not on file   Occupational History  . Not on file  Tobacco Use  . Smoking status: Former Smoker    Packs/day: 1.00    Years: 41.00    Pack years: 41.00  . Smokeless tobacco: Never Used  Substance and Sexual Activity  . Alcohol use: Never  . Drug use: Never  . Sexual activity: Not on file  Other Topics Concern  . Not on file  Social History Narrative  . Not on file   Social Determinants of Health   Financial Resource Strain: Not on file  Food Insecurity: Not on file  Transportation Needs: Not on file  Physical Activity: Not on file  Stress: Not on file  Social Connections: Not on file  Intimate Partner Violence: Not on file     BP 136/74   Pulse 62   Ht 5\' 3"  (1.6 m)   Wt 118 lb (53.5 kg)   SpO2 97%   BMI 20.90 kg/m   Physical Exam:  Well appearing NAD HEENT: Unremarkable Neck:  No JVD, no thyromegally Lymphatics:  No adenopathy Back:  No CVA tenderness Lungs:  Clear with no wheezes HEART:  Regular rate rhythm, no murmurs, no rubs, no clicks Abd:  soft, positive bowel sounds, no organomegally, no rebound, no guarding Ext:  2 plus  pulses, no edema, no cyanosis, no clubbing Skin:  No rashes no nodules Neuro:  CN II through XII intact, motor grossly intact  EKG - nsr  Assess/Plan: 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 I have discussed the indications/risks/benefits/goals/expectations and she wishes to proceed.  Sharlot Gowda Augustine Leverette,MD

## 2020-10-18 NOTE — Progress Notes (Signed)
HPI Leslie Franklin is referred by Dr. Tomie China for evaluation of syncope and CHB. She is a pleasant 48 y woman who has passed out 4 times over the past few years. She has worn a cardiac monitor and had daytime episodes of complete heart block. She has very little warning and she has had documented pauses of over 4 seconds. She has not injured herself and she denies loss of continence or tongue biting.  No Known Allergies   Current Outpatient Medications  Medication Sig Dispense Refill  . albuterol (VENTOLIN HFA) 108 (90 Base) MCG/ACT inhaler Inhale 2 puffs into the lungs every 6 (six) hours as needed for wheezing or shortness of breath. 8 g 0  . hydrOXYzine (ATARAX/VISTARIL) 25 MG tablet Take 1 tablet (25 mg total) by mouth 3 (three) times daily as needed. 90 tablet 1  . rosuvastatin (CRESTOR) 20 MG tablet Take 1 tablet (20 mg total) by mouth daily. 90 tablet 3  . sertraline (ZOLOFT) 50 MG tablet Take 1 tablet (50 mg total) by mouth daily. 90 tablet 0  . traZODone (DESYREL) 50 MG tablet Take 1 tablet (50 mg total) by mouth at bedtime. 30 tablet 3   No current facility-administered medications for this visit.     Past Medical History:  Diagnosis Date  . Anxiety 09/13/2020  . Claudication (HCC) 09/12/2020  . COPD (chronic obstructive pulmonary disease) (HCC)   . Depression   . Hyperlipidemia   . Screening mammogram for breast cancer 09/12/2020  . Syncope 09/12/2020    ROS:   All systems reviewed and negative except as noted in the HPI.   Past Surgical History:  Procedure Laterality Date  . NO PAST SURGERIES       Family History  Problem Relation Age of Onset  . Hypertension Mother   . Diabetes Mother   . Heart attack Father   . Emphysema Sister   . Stroke Brother      Social History   Socioeconomic History  . Marital status: Single    Spouse name: Not on file  . Number of children: 4  . Years of education: Not on file  . Highest education level: Not on file   Occupational History  . Not on file  Tobacco Use  . Smoking status: Former Smoker    Packs/day: 1.00    Years: 41.00    Pack years: 41.00  . Smokeless tobacco: Never Used  Substance and Sexual Activity  . Alcohol use: Never  . Drug use: Never  . Sexual activity: Not on file  Other Topics Concern  . Not on file  Social History Narrative  . Not on file   Social Determinants of Health   Financial Resource Strain: Not on file  Food Insecurity: Not on file  Transportation Needs: Not on file  Physical Activity: Not on file  Stress: Not on file  Social Connections: Not on file  Intimate Partner Violence: Not on file     BP 136/74   Pulse 62   Ht 5\' 3"  (1.6 m)   Wt 118 lb (53.5 kg)   SpO2 97%   BMI 20.90 kg/m   Physical Exam:  Well appearing NAD HEENT: Unremarkable Neck:  No JVD, no thyromegally Lymphatics:  No adenopathy Back:  No CVA tenderness Lungs:  Clear with no wheezes HEART:  Regular rate rhythm, no murmurs, no rubs, no clicks Abd:  soft, positive bowel sounds, no organomegally, no rebound, no guarding Ext:  2 plus  pulses, no edema, no cyanosis, no clubbing Skin:  No rashes no nodules Neuro:  CN II through XII intact, motor grossly intact  EKG - nsr  Assess/Plan: 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 I have discussed the indications/risks/benefits/goals/expectations and she wishes to proceed.  Davia Smyre,MD 

## 2020-10-18 NOTE — Patient Instructions (Addendum)
Medication Instructions:  Your physician recommends that you continue on your current medications as directed. Please refer to the Current Medication list given to you today.  Labwork: You will get lab work today:  BMP and CBC  Testing/Procedures: None ordered.  Follow-Up:  SEE INSTRUCTION LETTER  Any Other Special Instructions Will Be Listed Below (If Applicable).  If you need a refill on your cardiac medications before your next appointment, please call your pharmacy.    Pacemaker Implantation, Adult Pacemaker implantation is a procedure to place a pacemaker inside the chest. A pacemaker is a small computer that sends electrical signals to the heart and helps the heart beat normally. A pacemaker also stores information about heart rhythms. You may need pacemaker implantation if you have:  A slow heartbeat (bradycardia).  Loss of consciousness that happens repeatedly (syncope) or repeated episodes of dizziness or light-headedness because of an irregular heart rate.  Shortness of breath (dyspnea) due to heart problems. The pacemaker usually attaches to your heart through a wire called a lead. One or two leads may be needed. There are different types of pacemakers:  Transvenous pacemaker. This type is placed under the skin or muscle of your upper chest area. The lead goes through a vein in the chest area to reach the inside of the heart.  Epicardial pacemaker. This type is placed under the skin or muscle of your chest or abdomen. The lead goes through your chest to the outside of the heart. Tell a health care provider about:  Any allergies you have.  All medicines you are taking, including vitamins, herbs, eye drops, creams, and over-the-counter medicines.  Any problems you or family members have had with anesthetic medicines.  Any blood or bone disorders you have.  Any surgeries you have had.  Any medical conditions you have.  Whether you are pregnant or may be  pregnant. What are the risks? Generally, this is a safe procedure. However, problems may occur, including:  Infection.  Bleeding.  Failure of the pacemaker or the lead.  Collapse of a lung or bleeding into a lung.  Blood clot inside a blood vessel with a lead.  Damage to the heart.  Infection inside the heart (endocarditis).  Allergic reactions to medicines. What happens before the procedure? Staying hydrated Follow instructions from your health care provider about hydration, which may include:  Up to 2 hours before the procedure - you may continue to drink clear liquids, such as water, clear fruit juice, black coffee, and plain tea.   Eating and drinking restrictions Follow instructions from your health care provider about eating and drinking, which may include:  8 hours before the procedure - stop eating heavy meals or foods, such as meat, fried foods, or fatty foods.  6 hours before the procedure - stop eating light meals or foods, such as toast or cereal.  6 hours before the procedure - stop drinking milk or drinks that contain milk.  2 hours before the procedure - stop drinking clear liquids. Medicines Ask your health care provider about:  Changing or stopping your regular medicines. This is especially important if you are taking diabetes medicines or blood thinners.  Taking medicines such as aspirin and ibuprofen. These medicines can thin your blood. Do not take these medicines unless your health care provider tells you to take them.  Taking over-the-counter medicines, vitamins, herbs, and supplements. Tests You may have:  A heart evaluation. This may include: ? An electrocardiogram (ECG). This involves placing patches on   your skin to check your heart rhythm. ? A chest X-ray. ? An echocardiogram. This is a test that uses sound waves (ultrasound) to produce an image of the heart. ? A cardiac rhythm monitor. This is used to record your heart rhythm and any events  for a longer period of time.  Blood tests.  Genetic testing. General instructions  Do not use any products that contain nicotine or tobacco for at least 4 weeks before the procedure. These products include cigarettes, e-cigarettes, and chewing tobacco. If you need help quitting, ask your health care provider.  Ask your health care provider: ? How your surgery site will be marked. ? What steps will be taken to help prevent infection. These steps may include:  Removing hair at the surgery site.  Washing skin with a germ-killing soap.  Receiving antibiotic medicine.  Plan to have someone take you home from the hospital or clinic.  If you will be going home right after the procedure, plan to have someone with you for 24 hours. What happens during the procedure?  An IV will be inserted into one of your veins.  You will be given one or more of the following: ? A medicine to help you relax (sedative). ? A medicine to numb the area (local anesthetic). ? A medicine to make you fall asleep (general anesthetic).  The next steps vary depending on the type of pacemaker you will be getting. ? If you are getting a transvenous pacemaker:  An incision will be made in your upper chest.  A pocket will be made for the pacemaker. It may be placed under the skin or between layers of muscle.  The lead will be inserted into a blood vessel that goes to the heart.  While X-rays are taken by an imaging machine (fluoroscopy), the lead will be advanced through the vein to the inside of your heart.  The other end of the lead will be tunneled under the skin and attached to the pacemaker. ? If you are getting an epicardial pacemaker:  An incision will be made near your ribs or breastbone (sternum) for the lead.  The lead will be attached to the outside of your heart.  Another incision will be made in your chest or upper abdomen to create a pocket for the pacemaker.  The free end of the lead will  be tunneled under the skin and attached to the pacemaker.  The transvenous or epicardial pacemaker will be tested. Imaging studies may be done to check the lead position.  The incisions will be closed with stitches (sutures), adhesive strips, or skin glue.  Bandages (dressings) will be placed over the incisions. The procedure may vary among health care providers and hospitals. What happens after the procedure?  Your blood pressure, heart rate, breathing rate, and blood oxygen level will be monitored until you leave the hospital or clinic.  You may be given antibiotics.  You will be given pain medicine.  An ECG and chest X-rays will be done.  You may need to wear a continuous type of ECG (Holter monitor) to check your heart rhythm.  Your health care provider will program the pacemaker.  If you were given a sedative during the procedure, it can affect you for several hours. Do not drive or operate machinery until your health care provider says that it is safe.  You will be given a pacemaker identification card. This card lists the implant date, device model, and manufacturer of your pacemaker. Summary    A pacemaker is a small computer that sends electrical signals to the heart and helps the heart beat normally.  There are different types of pacemakers. A pacemaker may be placed under the skin or muscle of your chest or abdomen.  Follow instructions from your health care provider about eating and drinking and about taking medicines before the procedure. This information is not intended to replace advice given to you by your health care provider. Make sure you discuss any questions you have with your health care provider. Document Revised: 07/19/2019 Document Reviewed: 07/19/2019 Elsevier Patient Education  2021 Elsevier Inc.  

## 2020-10-19 LAB — SARS CORONAVIRUS 2 (TAT 6-24 HRS): SARS Coronavirus 2: NEGATIVE

## 2020-10-21 ENCOUNTER — Other Ambulatory Visit: Payer: Self-pay

## 2020-10-21 ENCOUNTER — Ambulatory Visit (HOSPITAL_COMMUNITY)
Admission: RE | Disposition: A | Discharge status: Home / Self Care | Payer: Self-pay | Source: Home / Self Care | Attending: Internal Medicine

## 2020-10-21 ENCOUNTER — Ambulatory Visit (HOSPITAL_COMMUNITY)
Admission: RE | Admit: 2020-10-21 | Discharge: 2020-10-21 | Disposition: A | Discharge status: Home / Self Care | Payer: 59 | Attending: Internal Medicine | Admitting: Internal Medicine

## 2020-10-21 ENCOUNTER — Ambulatory Visit (HOSPITAL_COMMUNITY): Payer: 59

## 2020-10-21 DIAGNOSIS — Z87891 Personal history of nicotine dependence: Secondary | ICD-10-CM | POA: Diagnosis not present

## 2020-10-21 DIAGNOSIS — Z79899 Other long term (current) drug therapy: Secondary | ICD-10-CM | POA: Diagnosis not present

## 2020-10-21 DIAGNOSIS — Z950 Presence of cardiac pacemaker: Secondary | ICD-10-CM

## 2020-10-21 DIAGNOSIS — R001 Bradycardia, unspecified: Secondary | ICD-10-CM

## 2020-10-21 DIAGNOSIS — I442 Atrioventricular block, complete: Secondary | ICD-10-CM

## 2020-10-21 HISTORY — PX: PACEMAKER IMPLANT: EP1218

## 2020-10-21 SURGERY — PACEMAKER IMPLANT
Anesthesia: LOCAL

## 2020-10-21 MED ORDER — POVIDONE-IODINE 10 % EX SWAB: 2.0000 "application " | Freq: Once | CUTANEOUS | Status: DC

## 2020-10-21 MED ORDER — KETOROLAC TROMETHAMINE 30 MG/ML IJ SOLN
30.0000 mg | Freq: Once | INTRAMUSCULAR | Status: DC
Start: 2020-10-21 — End: 2020-10-21

## 2020-10-21 MED ORDER — CHLORHEXIDINE GLUCONATE 4 % EX LIQD: 4.0000 "application " | Freq: Once | CUTANEOUS | Status: DC

## 2020-10-21 MED ORDER — CEFAZOLIN SODIUM-DEXTROSE 2-4 GM/100ML-% IV SOLN
2.0000 g | INTRAVENOUS | Status: AC
  Administered 2020-10-21: 2 g via INTRAVENOUS

## 2020-10-21 MED ORDER — CEFAZOLIN SODIUM-DEXTROSE 1-4 GM/50ML-% IV SOLN
1.0000 g | Freq: Once | INTRAVENOUS | Status: AC
  Administered 2020-10-21: 1 g via INTRAVENOUS

## 2020-10-21 MED ORDER — MIDAZOLAM HCL 5 MG/5ML IJ SOLN
INTRAMUSCULAR | Status: DC | PRN
  Administered 2020-10-21 (×2): 2 mg via INTRAVENOUS

## 2020-10-21 MED ORDER — HEPARIN (PORCINE) IN NACL 1000-0.9 UT/500ML-% IV SOLN
INTRAVENOUS | Status: DC | PRN
  Administered 2020-10-21: 500 mL

## 2020-10-21 MED ORDER — FENTANYL CITRATE (PF) 100 MCG/2ML IJ SOLN
INTRAMUSCULAR | Status: AC
  Filled 2020-10-21: qty 2

## 2020-10-21 MED ORDER — SODIUM CHLORIDE 0.9 % IV SOLN
INTRAVENOUS | Status: AC
  Filled 2020-10-21: qty 2

## 2020-10-21 MED ORDER — FENTANYL CITRATE (PF) 100 MCG/2ML IJ SOLN
INTRAMUSCULAR | Status: DC | PRN
  Administered 2020-10-21 (×2): 25 ug via INTRAVENOUS

## 2020-10-21 MED ORDER — CEFAZOLIN SODIUM-DEXTROSE 2-4 GM/100ML-% IV SOLN
1.0000 g | Freq: Once | INTRAVENOUS | Status: DC
  Filled 2020-10-21: qty 100

## 2020-10-21 MED ORDER — CEFAZOLIN SODIUM-DEXTROSE 2-4 GM/100ML-% IV SOLN
INTRAVENOUS | Status: AC
  Filled 2020-10-21: qty 100

## 2020-10-21 MED ORDER — LIDOCAINE HCL (PF) 1 % IJ SOLN
INTRAMUSCULAR | Status: DC | PRN
  Administered 2020-10-21: 60 mL

## 2020-10-21 MED ORDER — ONDANSETRON HCL 4 MG/2ML IJ SOLN: 4.0000 mg | Freq: Four times a day (QID) | INTRAMUSCULAR | Status: DC | PRN

## 2020-10-21 MED ORDER — MIDAZOLAM HCL 5 MG/5ML IJ SOLN
INTRAMUSCULAR | Status: AC
  Filled 2020-10-21: qty 5

## 2020-10-21 MED ORDER — LIDOCAINE HCL 1 % IJ SOLN
INTRAMUSCULAR | Status: AC
  Filled 2020-10-21: qty 60

## 2020-10-21 MED ORDER — SODIUM CHLORIDE 0.9 % IV SOLN: INTRAVENOUS | Status: DC

## 2020-10-21 MED ORDER — SODIUM CHLORIDE 0.9 % IV SOLN
80.0000 mg | INTRAVENOUS | Status: AC
  Administered 2020-10-21: 80 mg

## 2020-10-21 MED ORDER — HEPARIN (PORCINE) IN NACL 1000-0.9 UT/500ML-% IV SOLN
INTRAVENOUS | Status: AC
  Filled 2020-10-21: qty 500

## 2020-10-21 MED ORDER — ACETAMINOPHEN 325 MG PO TABS: 325.0000 mg | ORAL_TABLET | ORAL | Status: DC | PRN

## 2020-10-21 SURGICAL SUPPLY — 7 items
CABLE SURGICAL S-101-97-12 (CABLE) ×2 IMPLANT
LEAD SOLIA S PRO MRI 45 (Lead) ×2 IMPLANT
LEAD SOLIA S PRO MRI 53 (Lead) ×2 IMPLANT
PACEMAKER EDORA 8DR-T MRI (Pacemaker) ×2 IMPLANT
PAD PRO RADIOLUCENT 2001M-C (PAD) ×2 IMPLANT
SHEATH 7FR PRELUDE SNAP 13 (SHEATH) ×4 IMPLANT
TRAY PACEMAKER INSERTION (PACKS) ×2 IMPLANT

## 2020-10-21 NOTE — Discharge Instructions (Signed)
After Your Pacemaker   . You have a Biotronik Pacemaker  ACTIVITY . Do not lift your arm above shoulder height for 1 week after your procedure. After 7 days, you may progress as below.  . You should remove your sling 24 hours after your procedure, unless otherwise instructed by your provider.     Monday October 28, 2020  Tuesday October 29, 2020 Wednesday October 30, 2020 Thursday October 31, 2020   . Do not lift, push, pull, or carry anything over 10 pounds with the affected arm until 6 weeks (Monday December 02, 2020 ) after your procedure.   . Do NOT DRIVE until you have been seen for your wound check, or as long as instructed by your healthcare provider.   . Ask your healthcare provider when you can go back to work   INCISION/Dressing   . If large square, outer bandage is left in place, this can be removed after 24 hours from your procedure. Do not remove steri-strips or glue as below.   . Monitor your Pacemaker site for redness, swelling, and drainage. Call the device clinic at [phone] if you experience these symptoms or fever/chills.  . If your incision is sealed with Steri-strips or staples, you may shower 10 days after your procedure or when told by your provider. Do not remove the steri-strips or let the shower hit directly on your site. You may wash around your site with soap and water.    Marland Kitchen Avoid lotions, ointments, or perfumes over your incision until it is well-healed.  . You may use a hot tub or a pool AFTER your wound check appointment if the incision is completely closed.  Marland Kitchen PAcemaker Alerts:  Some alerts are vibratory and others beep. These are NOT emergencies. Please call our office to let us know. If this occurs at night or on weekends, it can wait until the next business day. Send a remote transmission.  . If your device is capable of reading fluid status (for heart failure), you will be offered monthly monitoring to review this with you.   DEVICE MANAGEMENT . Remote  monitoring is used to monitor your pacemaker from home. This monitoring is scheduled every 91 days by our office. It allows [place] to keep an eye on the functioning of your device to ensure it is working properly. You will routinely see your Electrophysiologist annually (more often if necessary).   . You should receive your ID card for your new device in 4-8 weeks. Keep this card with you at all times once received. Consider wearing a medical alert bracelet or necklace.  . Your Pacemaker may be MRI compatible. This will be discussed at your next office visit/wound check.  You should avoid contact with strong electric or magnetic fields.    Do not use amateur (ham) radio equipment or electric (arc) welding torches. MP3 player headphones with magnets should not be used. Some devices are safe to use if held at least 12 inches (30 cm) from your Pacemaker. These include power tools, lawn mowers, and speakers. If you are unsure if something is safe to use, ask your health care provider.   When using your cell phone, hold it to the ear that is on the opposite side from the Pacemaker. Do not leave your cell phone in a pocket over the Pacemaker.   You may safely use electric blankets, heating pads, computers, and microwave ovens.  Call the office right away if:  You have chest pain.  You  feel more short of breath than you have felt before.  You feel more light-headed than you have felt before.  Your incision starts to open up.  This information is not intended to replace advice given to you by your health care provider. Make sure you discuss any questions you have with your health care provider.

## 2020-10-21 NOTE — Progress Notes (Signed)
Report received from Trina, RN

## 2020-10-21 NOTE — Progress Notes (Signed)
Discharge instructions reviewed with pt and her daughter Eda Paschal (via telephone) both voice understanding.

## 2020-10-21 NOTE — Progress Notes (Signed)
Dr Taylor notified of cxr results and ok to d/c home 

## 2020-10-21 NOTE — Progress Notes (Signed)
Client up to bedside commode and noted left chest at pacemaker site with firmness noted below pacemaker site; Andy,PA notified and per Andy,PA Dr Ladona Ridgel will be in to see client

## 2020-10-21 NOTE — Interval H&P Note (Signed)
History and Physical Interval Note:  10/21/2020 11:03 AM  Leslie Franklin  has presented today for surgery, with the diagnosis of bradycardia.  The various methods of treatment have been discussed with the patient and family. After consideration of risks, benefits and other options for treatment, the patient has consented to  Procedure(s): PACEMAKER IMPLANT (N/A) as a surgical intervention.  The patient's history has been reviewed, patient examined, no change in status, stable for surgery.  I have reviewed the patient's chart and labs.  Questions were answered to the patient's satisfaction.     Lewayne Bunting

## 2020-10-21 NOTE — Progress Notes (Signed)
Dr Ladona Ridgel in and checked client's pacemaker site and ok to d/c home

## 2020-10-22 ENCOUNTER — Telehealth: Payer: Self-pay

## 2020-10-22 ENCOUNTER — Encounter (HOSPITAL_COMMUNITY): Payer: Self-pay | Admitting: Internal Medicine

## 2020-10-22 MED FILL — Lidocaine HCl Local Inj 1%: INTRAMUSCULAR | Qty: 60 | Status: AC

## 2020-10-22 NOTE — Telephone Encounter (Signed)
Follow-up after same day discharge: Implant date: 10/21/20 MD: Lewayne Bunting, MD Device: Biotronik Edora 8 DR-T (Dual PPM) Location: Left Chest   Wound check visit: 10/31/20 @ 10:00 AM 90 day MD follow-up: 01/21/21 @ 2:00 PM  Remote Transmission received:Yes  Dressing removed: Yes. Has no complaints. Sling Removed: Yes

## 2020-10-22 NOTE — Telephone Encounter (Signed)
-----   Message from Graciella Freer, [place] sent at 10/21/2020  3:24 PM EST -----  Dr. Ladona Ridgel same day discharge   Thank you!  Casimiro Needle [address]" [place], PA-C  10/21/2020 3:24 PM

## 2020-10-28 ENCOUNTER — Other Ambulatory Visit: Payer: Self-pay | Admitting: Nurse Practitioner

## 2020-10-28 DIAGNOSIS — R270 Ataxia, unspecified: Secondary | ICD-10-CM

## 2020-10-30 ENCOUNTER — Telehealth: Payer: Self-pay | Admitting: Cardiology

## 2020-10-30 NOTE — Telephone Encounter (Signed)
New message:     Patient calling stating that she had a pacemaker put in a few days ago and she is having a up set stomach Please call patient back.

## 2020-10-30 NOTE — Telephone Encounter (Signed)
Spoke with patient who states she has had one episode of diarrhea this morning and wants to know if it is related to the pacemaker insertion. I advised that there are numerous potential causes for diarrhea, but not specifically related to pacemaker insertion. She denies vomiting, denies hematochezia or coffee ground stool. I advised her to monitor her s/s and if diarrhea persists, to contact her PCP. She verbalized understanding and agreement and thanked me for the call.

## 2020-10-31 ENCOUNTER — Institutional Professional Consult (permissible substitution): Payer: Self-pay | Admitting: Cardiology

## 2020-10-31 ENCOUNTER — Ambulatory Visit: Payer: Self-pay

## 2020-11-01 ENCOUNTER — Telehealth: Payer: Self-pay

## 2020-11-01 NOTE — Telephone Encounter (Signed)
Leslie Franklin please advise [facility] insurance is requiring a Peer to Peer for pt's Ct head w/0 contrast. They said for the provider to call them at [phone] at your convenience. Thanks.

## 2020-11-05 ENCOUNTER — Encounter: Payer: Self-pay | Admitting: Vascular Surgery

## 2020-11-05 ENCOUNTER — Ambulatory Visit (INDEPENDENT_AMBULATORY_CARE_PROVIDER_SITE_OTHER): Payer: 59 | Admitting: Emergency Medicine

## 2020-11-05 ENCOUNTER — Ambulatory Visit (HOSPITAL_COMMUNITY)
Admission: RE | Admit: 2020-11-05 | Discharge: 2020-11-05 | Disposition: A | Discharge status: Home / Self Care | Payer: 59 | Source: Ambulatory Visit | Attending: Vascular Surgery | Admitting: Vascular Surgery

## 2020-11-05 ENCOUNTER — Other Ambulatory Visit: Payer: Self-pay

## 2020-11-05 ENCOUNTER — Ambulatory Visit (INDEPENDENT_AMBULATORY_CARE_PROVIDER_SITE_OTHER): Payer: 59 | Admitting: Vascular Surgery

## 2020-11-05 VITALS — BP 110/66 | Temp 98.2°F | Resp 20 | Ht 63.0 in | Wt 120.0 lb

## 2020-11-05 DIAGNOSIS — I739 Peripheral vascular disease, unspecified: Secondary | ICD-10-CM | POA: Diagnosis present

## 2020-11-05 DIAGNOSIS — M79662 Pain in left lower leg: Secondary | ICD-10-CM

## 2020-11-05 DIAGNOSIS — M79661 Pain in right lower leg: Secondary | ICD-10-CM

## 2020-11-05 DIAGNOSIS — I442 Atrioventricular block, complete: Secondary | ICD-10-CM | POA: Diagnosis not present

## 2020-11-05 LAB — CUP PACEART INCLINIC DEVICE CHECK
Battery Remaining Longevity: 106 mo
Brady Statistic RA Percent Paced: 55 %
Brady Statistic RV Percent Paced: 1 %
Date Time Interrogation Session: 20220308090945
Implantable Lead Implant Date: 20220221
Implantable Lead Implant Date: 20220221
Implantable Lead Location: 753859
Implantable Lead Location: 753860
Implantable Lead Model: 377171
Implantable Lead Model: 377171
Implantable Lead Serial Number: 8000187789
Implantable Lead Serial Number: 8000197459
Implantable Pulse Generator Implant Date: 20220221
Lead Channel Impedance Value: 468 Ohm
Lead Channel Impedance Value: 468 Ohm
Lead Channel Impedance Value: 741 Ohm
Lead Channel Impedance Value: 741 Ohm
Lead Channel Pacing Threshold Amplitude: 0.6 V
Lead Channel Pacing Threshold Amplitude: 0.7 V
Lead Channel Pacing Threshold Pulse Width: 0.4 ms
Lead Channel Pacing Threshold Pulse Width: 0.4 ms
Lead Channel Sensing Intrinsic Amplitude: 11.6 mV
Lead Channel Sensing Intrinsic Amplitude: 3.7 mV
Lead Channel Setting Pacing Amplitude: 3 V
Lead Channel Setting Pacing Amplitude: 3 V
Lead Channel Setting Pacing Pulse Width: 0.4 ms
Pulse Gen Model: 407145
Pulse Gen Serial Number: 70053960

## 2020-11-05 NOTE — Progress Notes (Signed)
Wound check appointment. Steri-strips removed. Wound without redness or edema. Incision edges approximated, wound well healed. Normal device function. Thresholds, sensing, and impedances consistent with implant measurements. Device programmed at 3.0V for extra safety margin until 3 month visit. Histogram distribution appropriate for patient and level of activity. No mode switches or high ventricular rates noted. Patient educated about wound care, arm mobility, lifting restrictions. ROV with Dr. Ladona Ridgel on 01/21/21.  Patient enrolled in remote monitoring, transmitting nightly, next summary report on 01/20/21.

## 2020-11-05 NOTE — Progress Notes (Signed)
[facility] [place]  ASSESSMENT / PLAN: Leslie Franklin is a 60 y.o. female with bilateral calf discomfort while walking.  No evidence of hemodynamically significant peripheral arterial disease on physical exam or noninvasive laboratory study today.  Suspect patient may be suffering from pseudoclaudication.  Patient can follow-up with me as needed.  CHIEF COMPLAINT: Calf pain when walking  HISTORY OF PRESENT ILLNESS: Leslie Franklin is a 60 y.o. female who presents to clinic for evaluation of bilateral calf discomfort while walking.  Patient has remote history of tobacco use.  She has no symptoms typical of ischemic rest pain.  She has no evidence of ischemic ulceration.  She reports no history of coronary artery disease or ischemic stroke.  Patient reports the pain is not exacerbated by positional changes.  She reports elevation of the legs at night seems to help her.  Past Medical History:  Diagnosis Date  . Anxiety 09/13/2020  . Claudication (HCC) 09/12/2020  . COPD (chronic obstructive pulmonary disease) (HCC)   . Depression   . Hyperlipidemia   . Screening mammogram for breast cancer 09/12/2020  . Syncope 09/12/2020    Past Surgical History:  Procedure Laterality Date  . NO PAST SURGERIES    . PACEMAKER IMPLANT N/A 10/21/2020   Procedure: PACEMAKER IMPLANT;  Surgeon: Marinus Maw, MD;  Location: [facility] INVASIVE CV LAB;  Service: Cardiovascular;  Laterality: N/A;    Family History  Problem Relation Age of Onset  . Hypertension Mother   . Diabetes Mother   . Heart attack Father   . Emphysema Sister   . Stroke Brother     Social History   Socioeconomic History  . Marital status: Single    Spouse name: Not on file  . Number of children: 4  . Years of education: Not on file  . Highest education level: Not on file  Occupational History  . Not on file  Tobacco Use  . Smoking status: Former Smoker    Packs/day: 1.00    Years: 41.00    Pack years: 41.00  .  Smokeless tobacco: Never Used  Substance and Sexual Activity  . Alcohol use: Never  . Drug use: Never  . Sexual activity: Not on file  Other Topics Concern  . Not on file  Social History Narrative  . Not on file   Social Determinants of Health   Financial Resource Strain: Not on file  Food Insecurity: Not on file  Transportation Needs: Not on file  Physical Activity: Not on file  Stress: Not on file  Social Connections: Not on file  Intimate Partner Violence: Not on file    No Known Allergies  Current Outpatient Medications  Medication Sig Dispense Refill  . albuterol (VENTOLIN HFA) 108 (90 Base) MCG/ACT inhaler Inhale 2 puffs into the lungs every 6 (six) hours as needed for wheezing or shortness of breath. 8 g 0  . hydrOXYzine (ATARAX/VISTARIL) 25 MG tablet Take 1 tablet (25 mg total) by mouth 3 (three) times daily as needed. (Patient taking differently: Take 25 mg by mouth 3 (three) times daily as needed for anxiety.) 90 tablet 1  . rosuvastatin (CRESTOR) 20 MG tablet Take 1 tablet (20 mg total) by mouth daily. 90 tablet 3  . sertraline (ZOLOFT) 50 MG tablet Take 1 tablet (50 mg total) by mouth daily. 90 tablet 0  . traZODone (DESYREL) 50 MG tablet Take 1 tablet (50 mg total) by mouth at bedtime. (Patient taking differently: Take 50 mg by mouth  2 (two) times a week. Sleep) 30 tablet 3   No current facility-administered medications for this visit.    REVIEW OF SYSTEMS:  [X]  denotes positive finding, [ ]  denotes negative finding Cardiac  Comments:  Chest pain or chest pressure:    Shortness of breath upon exertion:    Short of breath when lying flat:    Irregular heart rhythm:        Vascular    Pain in calf, thigh, or hip brought on by ambulation: x   Pain in feet at night that wakes you up from your sleep:  x   Blood clot in your veins:    Leg swelling:         Pulmonary    Oxygen at home:    Productive cough:     Wheezing:         Neurologic    Sudden weakness  in arms or legs:     Sudden numbness in arms or legs:     Sudden onset of difficulty speaking or slurred speech:    Temporary loss of vision in one eye:     Problems with dizziness:         Gastrointestinal    Blood in stool:     Vomited blood:         Genitourinary    Burning when urinating:     Blood in urine:        Psychiatric    Major depression:         Hematologic    Bleeding problems:    Problems with blood clotting too easily:        Skin    Rashes or ulcers:        Constitutional    Fever or chills:      PHYSICAL EXAM  Vitals:   11/05/20 1304  BP: 110/66  Resp: 20  Temp: 98.2 F (36.8 C)  SpO2: 96%  Weight: 120 lb (54.4 kg)  Height: 5\' 3"  (1.6 m)    Constitutional: well appearing. no distress. Appears well nourished.  Neurologic: CN intact. no focal findings. no sensory loss. Psychiatric: Mood and affect symmetric and appropriate. Eyes: No icterus. No conjunctival pallor. Ears, nose, throat: mucous membranes moist. Midline trachea.  Cardiac: regular rate and rhythm.  Respiratory: unlabored. Abdominal: soft, non-tender, non-distended.  Peripheral vascular:  2+ DP pulses bilaterally Extremity: No edema. No cyanosis. No pallor.  Skin: No gangrene. No ulceration.  Lymphatic: No Stemmer's sign. No palpable lymphadenopathy.  PERTINENT LABORATORY AND RADIOLOGIC DATA  Most recent CBC CBC Latest Ref Rng & Units 10/18/2020 09/13/2020  WBC 3.4 - 10.8 x10E3/uL 9.8 7.8  Hemoglobin 11.1 - 15.9 g/dL 10/20/2020  Hematocrit 09/15/2020 - 46.6 % 45.4 44.7  Platelets 150 - 450 x10E3/uL 339 334    Most recent CMP CMP Latest Ref Rng & Units 10/18/2020 09/13/2020  Glucose 65 - 99 mg/dL 93 92  BUN 6 - 24 mg/dL 12 17  Creatinine 27.5 - 1.00 mg/dL 10/20/2020 09/15/2020)  Sodium 1.70 - 144 mmol/L 138 137  Potassium 3.5 - 5.2 mmol/L 4.3 4.6  Chloride 96 - 106 mmol/L 100 99  CO2 20 - 29 mmol/L 20 23  Calcium 8.7 - 10.2 mg/dL 9.7 9.7  Total Protein 6.0 - 8.5 g/dL - 6.7  Total  Bilirubin 0.0 - 1.2 mg/dL - 0.5  Alkaline Phos 44 - 121 IU/L - 111  AST 0 - 40 IU/L - 19  ALT 0 - 32 IU/L -  12   LDL Chol Calc (NIH)  Date Value Ref Range Status  09/13/2020 218 (H) 0 - 99 mg/dL Final    Rande Brunt. Lenell Antu, MD [facility] Phone Number: [phone] 11/05/2020 10:16 AM

## 2020-11-13 ENCOUNTER — Other Ambulatory Visit: Payer: Self-pay | Admitting: Nurse Practitioner

## 2020-11-13 DIAGNOSIS — I639 Cerebral infarction, unspecified: Secondary | ICD-10-CM

## 2020-11-13 NOTE — Op Note (Signed)
CT head without contrast revealed small lacunar infarct in the right cerebellum and right frontal cortical infarct. MRI recommended for better evaluation of acute infarct. Pt recently had pacemaker insertion. Will contact patient to see if new pacemaker is "MRI friendly". Will refer pt to neurology.

## 2020-11-14 ENCOUNTER — Telehealth: Payer: Self-pay | Admitting: Nurse Practitioner

## 2020-11-14 NOTE — Telephone Encounter (Signed)
q 

## 2020-11-20 ENCOUNTER — Encounter: Payer: Self-pay | Admitting: Cardiology

## 2020-11-20 ENCOUNTER — Ambulatory Visit (INDEPENDENT_AMBULATORY_CARE_PROVIDER_SITE_OTHER): Payer: 59 | Admitting: Cardiology

## 2020-11-20 ENCOUNTER — Other Ambulatory Visit: Payer: Self-pay

## 2020-11-20 VITALS — BP 118/70 | HR 98 | Ht 64.0 in | Wt 119.5 lb

## 2020-11-20 DIAGNOSIS — Z87891 Personal history of nicotine dependence: Secondary | ICD-10-CM | POA: Diagnosis not present

## 2020-11-20 DIAGNOSIS — Z950 Presence of cardiac pacemaker: Secondary | ICD-10-CM

## 2020-11-20 DIAGNOSIS — I442 Atrioventricular block, complete: Secondary | ICD-10-CM | POA: Insufficient documentation

## 2020-11-20 DIAGNOSIS — E782 Mixed hyperlipidemia: Secondary | ICD-10-CM | POA: Diagnosis not present

## 2020-11-20 HISTORY — DX: Presence of cardiac pacemaker: Z95.0

## 2020-11-20 HISTORY — DX: Atrioventricular block, complete: I44.2

## 2020-11-20 NOTE — Patient Instructions (Signed)
Medication Instructions:  No medication changes. *If you need a refill on your cardiac medications before your next appointment, please call your pharmacy*   Lab Work: Your physician recommends that you return for lab work in: 1st week of May. You need to have labs done when you are fasting.  You can come Monday through Friday 8:30 am to 12:00 pm and 1:15 to 4:30. You do not need to make an appointment as the order has already been placed. The labs you are going to have done are BMET, LFT and Lipids.  If you have labs (blood work) drawn today and your tests are completely normal, you will receive your results only by: Marland Kitchen MyChart Message (if you have MyChart) OR . A paper copy in the mail If you have any lab test that is abnormal or we need to change your treatment, we will call you to review the results.   Testing/Procedures: None ordered   Follow-Up: At [facility], you and your health needs are our priority.  As part of our continuing mission to provide you with exceptional heart care, we have created designated Provider Care Teams.  These Care Teams include your primary Cardiologist (physician) and Advanced Practice Providers (APPs -  Physician Assistants and Nurse Practitioners) who all work together to provide you with the care you need, when you need it.  We recommend signing up for the patient portal called "MyChart".  Sign up information is provided on this After Visit Summary.  MyChart is used to connect with patients for Virtual Visits (Telemedicine).  Patients are able to view lab/test results, encounter notes, upcoming appointments, etc.  Non-urgent messages can be sent to your provider as well.   To learn more about what you can do with MyChart, go to [email or web address].    Your next appointment:   3 month(s)  The format for your next appointment:   In Person  Provider:   Belva Crome, MD   Other Instructions NA

## 2020-11-20 NOTE — Progress Notes (Signed)
Cardiology Office Note:    Date:  11/20/2020   ID:  Leslie Franklin, DOB [birth year], MRN [id number]  PCP:  Leslie Morning, NP  Cardiologist:  Leslie Brothers, MD   Referring MD: Leslie Morning, NP    ASSESSMENT:    1. Mixed hyperlipidemia   2. Ex-smoker   3. Heart block AV complete (HCC)   4. Presence of permanent cardiac pacemaker    PLAN:    In order of problems listed above:  1. High degree AV block and asystole: Patient now has a permanent pacemaker and feels much better and happy about it.  I discussed these findings with her at extensive and questions were answered to her satisfaction. 2. Marked dyslipidemia: On statin therapy.  Diet was emphasized.  She will be back in a month for blood work including fasting lipids.  Importance of regular walking stressed and she promises to do better. 3. History of stroke on CT scan: On aspirin therapy and managed by primary care. 4. Ex-smoker: Promises never to go back to smoking. 5. Patient will be seen in follow-up appointment in 3 months or earlier if the patient has any concerns    Medication Adjustments/Labs and Tests Ordered: Current medicines are reviewed at length with the patient today.  Concerns regarding medicines are outlined above.  No orders of the defined types were placed in this encounter.  No orders of the defined types were placed in this encounter.    No chief complaint on file.    History of Present Illness:    Leslie Franklin is a 60 y.o. female.  Patient has past medical history of hyperlipidemia.  Evaluated her for dizziness and syncopal-like episodes and her monitoring was markedly abnormal with areas of asystole and high-grade AV block.  Subsequently patient required pacemaker.  She is doing much better.  Also a CT scan was recommended and it reveals infarcts.  She is on aspirin and managed by her primary care provider.  Her LDL is markedly elevated.  At the time of my evaluation, the patient is alert  awake oriented and in no distress.  Past Medical History:  Diagnosis Date  . Anxiety 09/13/2020  . Cardiac murmur 09/18/2020  . Chest pain of uncertain etiology 09/18/2020  . Claudication (HCC) 09/12/2020  . COPD (chronic obstructive pulmonary disease) (HCC)   . Depression   . Ex-smoker 09/18/2020  . Hyperlipidemia   . Palpitations 09/18/2020  . Screening mammogram for breast cancer 09/12/2020  . Syncope 09/12/2020    Past Surgical History:  Procedure Laterality Date  . NO PAST SURGERIES    . PACEMAKER IMPLANT N/A 10/21/2020   Procedure: PACEMAKER IMPLANT;  Surgeon: Marinus Maw, MD;  Location: [facility] INVASIVE CV LAB;  Service: Cardiovascular;  Laterality: N/A;    Current Medications: Current Meds  Medication Sig  . albuterol (VENTOLIN HFA) 108 (90 Base) MCG/ACT inhaler Inhale 2 puffs into the lungs every 6 (six) hours as needed for wheezing or shortness of breath.  Marland Kitchen aspirin 81 MG chewable tablet Chew 81 mg by mouth daily.  . hydrOXYzine (ATARAX/VISTARIL) 25 MG tablet Take 25 mg by mouth 3 (three) times daily as needed for anxiety.  . rosuvastatin (CRESTOR) 20 MG tablet Take 1 tablet (20 mg total) by mouth daily.  . sertraline (ZOLOFT) 50 MG tablet Take 1 tablet (50 mg total) by mouth daily.  . traZODone (DESYREL) 50 MG tablet Take 1 tablet (50 mg total) by mouth at bedtime.     Allergies:  Patient has no known allergies.   Social History   Socioeconomic History  . Marital status: Single    Spouse name: Not on file  . Number of children: 4  . Years of education: Not on file  . Highest education level: Not on file  Occupational History  . Not on file  Tobacco Use  . Smoking status: Former Smoker    Packs/day: 1.00    Years: 41.00    Pack years: 41.00  . Smokeless tobacco: Never Used  Vaping Use  . Vaping Use: Never used  Substance and Sexual Activity  . Alcohol use: Never  . Drug use: Never  . Sexual activity: Not on file  Other Topics Concern  . Not on file   Social History Narrative  . Not on file   Social Determinants of Health   Financial Resource Strain: Not on file  Food Insecurity: Not on file  Transportation Needs: Not on file  Physical Activity: Not on file  Stress: Not on file  Social Connections: Not on file     Family History: The patient's family history includes Diabetes in her mother; Emphysema in her sister; Heart attack in her father; Hypertension in her mother; Stroke in her brother.  ROS:   Please see the history of present illness.    All other systems reviewed and are negative.  EKGs/Labs/Other Studies Reviewed:    The following studies were reviewed today: I discussed my findings with the patient at extensive length   Recent Labs: 09/13/2020: ALT 12; TSH 1.220 10/18/2020: BUN 12; Creatinine, Ser 0.83; Hemoglobin 15.2; Platelets 339; Potassium 4.3; Sodium 138  Recent Lipid Panel    Component Value Date/Time   CHOL 292 (H) 09/13/2020 1205   TRIG 128 09/13/2020 1205   HDL 51 09/13/2020 1205   CHOLHDL 5.7 (H) 09/13/2020 1205   LDLCALC 218 (H) 09/13/2020 1205    Physical Exam:    VS:  BP 118/70   Pulse 98   Ht 5\' 4"  (1.626 m)   Wt 119 lb 8 oz (54.2 kg)   SpO2 96%   BMI 20.51 kg/m     Wt Readings from Last 3 Encounters:  11/20/20 119 lb 8 oz (54.2 kg)  11/05/20 120 lb (54.4 kg)  10/21/20 118 lb (53.5 kg)     GEN: Patient is in no acute distress HEENT: Normal NECK: No JVD; No carotid bruits LYMPHATICS: No lymphadenopathy CARDIAC: Hear sounds regular, 2/6 systolic murmur at the apex. RESPIRATORY:  Clear to auscultation without rales, wheezing or rhonchi  ABDOMEN: Soft, non-tender, non-distended MUSCULOSKELETAL:  No edema; No deformity  SKIN: Warm and dry NEUROLOGIC:  Alert and oriented x 3 PSYCHIATRIC:  Normal affect   Signed, 10/23/20, MD  11/20/2020 2:34 PM    [facility]

## 2020-11-26 ENCOUNTER — Telehealth (INDEPENDENT_AMBULATORY_CARE_PROVIDER_SITE_OTHER): Payer: 59 | Admitting: Nurse Practitioner

## 2020-11-26 ENCOUNTER — Telehealth: Payer: Self-pay

## 2020-11-26 ENCOUNTER — Encounter: Payer: Self-pay | Admitting: Nurse Practitioner

## 2020-11-26 VITALS — BP 129/84 | HR 124 | Ht 63.0 in | Wt 119.0 lb

## 2020-11-26 DIAGNOSIS — K529 Noninfective gastroenteritis and colitis, unspecified: Secondary | ICD-10-CM

## 2020-11-26 MED ORDER — DIPHENOXYLATE-ATROPINE 2.5-0.025 MG PO TABS: 1.0000 | ORAL_TABLET | Freq: Four times a day (QID) | ORAL | 0 refills | Status: DC | PRN

## 2020-11-26 MED ORDER — ONDANSETRON 4 MG PO TBDP: 4.0000 mg | ORAL_TABLET | Freq: Three times a day (TID) | ORAL | 0 refills | Status: DC | PRN

## 2020-11-26 NOTE — Telephone Encounter (Signed)
Biotronik alert received for High Atrial episode recorded.  Avail EGM reviewed, appears possible AF with RVR although very brief <40 seconds.    Spoke with pt, she reports yesterday,s he was ill most of the day.  Very low energy, no appetite.  She was in bed most of the day.    Today pt feels better, denies any current symptoms.    No documented history of AF.  PPM implanted 10/21/20 for high degree AV block.

## 2020-11-26 NOTE — Progress Notes (Signed)
Virtual Visit via Telephone Note   This visit type was conducted due to national recommendations for restrictions regarding the COVID-19 Pandemic (e.g. social distancing) in an effort to limit this patient's exposure and mitigate transmission in our community.  Due to her co-morbid illnesses, this patient is at least at moderate risk for complications without adequate follow up.  This format is felt to be most appropriate for this patient at this time.  The patient did not have access to video technology/had technical difficulties with video requiring transitioning to audio format only (telephone).  All issues noted in this document were discussed and addressed.  No physical exam could be performed with this format.  Patient verbally consented to a telehealth visit.   Date:  11/26/2020   ID:  Leslie Franklin, DOB 1961-03-26, MRN [id number]  Patient Location: Home Provider Location: Office/Clinic  PCP:  [name], NP   Evaluation Performed:  Established patient, acute telemedicine visit  Chief Complaint:  Vomiting and diarrhea  History of Present Illness:    Leslie Franklin is a 60 y.o. female with vomiting, diarrhea, chills, fatigue, and tachycardia. Onset of symptoms was 1-day ago. Treatment has included pushing fluids and trying to eat small meals. She denies exposure to known ill-contacts. She denies abd pain, chest pain, or dyspnea. She recently had pacemaker/defibrillator inserted for third degree heart block. Defibrillator rep called pt yesterday to report her pulse is 124.   The patient does have symptoms concerning for COVID-19 infection (fever, chills, cough, or new shortness of breath).    Past Medical History:  Diagnosis Date  . Anxiety 09/13/2020  . Cardiac murmur 09/18/2020  . Chest pain of uncertain etiology 09/18/2020  . Claudication (HCC) 09/12/2020  . COPD (chronic obstructive pulmonary disease) (HCC)   . Depression   . Ex-smoker 09/18/2020  . Hyperlipidemia   .  Palpitations 09/18/2020  . Screening mammogram for breast cancer 09/12/2020  . Syncope 09/12/2020    Past Surgical History:  Procedure Laterality Date  . NO PAST SURGERIES    . PACEMAKER IMPLANT N/A 10/21/2020   Procedure: PACEMAKER IMPLANT;  Surgeon: Marinus Maw, MD;  Location: [facility] INVASIVE CV LAB;  Service: Cardiovascular;  Laterality: N/A;    Family History  Problem Relation Age of Onset  . Hypertension Mother   . Diabetes Mother   . Heart attack Father   . Emphysema Sister   . Stroke Brother     Social History   Socioeconomic History  . Marital status: Single    Spouse name: Not on file  . Number of children: 4  . Years of education: Not on file  . Highest education level: Not on file  Occupational History  . Not on file  Tobacco Use  . Smoking status: Former Smoker    Packs/day: 1.00    Years: 41.00    Pack years: 41.00  . Smokeless tobacco: Never Used  Vaping Use  . Vaping Use: Never used  Substance and Sexual Activity  . Alcohol use: Never  . Drug use: Never  . Sexual activity: Not on file  Other Topics Concern  . Not on file  Social History Narrative  . Not on file   Social Determinants of Health   Financial Resource Strain: Not on file  Food Insecurity: Not on file  Transportation Needs: Not on file  Physical Activity: Not on file  Stress: Not on file  Social Connections: Not on file  Intimate Partner Violence: Not on file  Outpatient Medications Prior to Visit  Medication Sig Dispense Refill  . albuterol (VENTOLIN HFA) 108 (90 Base) MCG/ACT inhaler Inhale 2 puffs into the lungs every 6 (six) hours as needed for wheezing or shortness of breath. 8 g 0  . aspirin 81 MG chewable tablet Chew 81 mg by mouth daily.    . hydrOXYzine (ATARAX/VISTARIL) 25 MG tablet Take 25 mg by mouth 3 (three) times daily as needed for anxiety.    . rosuvastatin (CRESTOR) 20 MG tablet Take 1 tablet (20 mg total) by mouth daily. 90 tablet 3  . sertraline (ZOLOFT) 50  MG tablet Take 1 tablet (50 mg total) by mouth daily. 90 tablet 0  . traZODone (DESYREL) 50 MG tablet Take 1 tablet (50 mg total) by mouth at bedtime. 30 tablet 3   No facility-administered medications prior to visit.    Allergies:   Patient has no known allergies.   Social History   Tobacco Use  . Smoking status: Former Smoker    Packs/day: 1.00    Years: 41.00    Pack years: 41.00  . Smokeless tobacco: Never Used  Vaping Use  . Vaping Use: Never used  Substance Use Topics  . Alcohol use: Never  . Drug use: Never     Review of Systems  Constitutional: Positive for chills and malaise/fatigue. Negative for diaphoresis and fever.  Cardiovascular: Negative for chest pain and orthopnea.  Gastrointestinal: Positive for diarrhea, nausea and vomiting. Negative for abdominal pain, blood in stool and constipation.  Genitourinary: Negative for dysuria, flank pain, frequency, hematuria and urgency.  Skin: Negative for rash.  Neurological: Negative.   Psychiatric/Behavioral: Negative.   All other systems reviewed and are negative.    Labs/Other Tests and Data Reviewed:    Recent Labs: 09/13/2020: ALT 12; TSH 1.220 10/18/2020: BUN 12; Creatinine, Ser 0.83; Hemoglobin 15.2; Platelets 339; Potassium 4.3; Sodium 138   Recent Lipid Panel Lab Results  Component Value Date/Time   CHOL 292 (H) 09/13/2020 12:05 PM   TRIG 128 09/13/2020 12:05 PM   HDL 51 09/13/2020 12:05 PM   CHOLHDL 5.7 (H) 09/13/2020 12:05 PM   LDLCALC 218 (H) 09/13/2020 12:05 PM    Wt Readings from Last 3 Encounters:  11/26/20 119 lb (54 kg)  11/20/20 119 lb 8 oz (54.2 kg)  11/05/20 120 lb (54.4 kg)     Objective:    Vital Signs:  BP 129/84   Pulse (!) 124   Ht 5\' 3"  (1.6 m)   Wt 119 lb (54 kg)   BMI 21.08 kg/m    Physical Exam Vitals reviewed.   BP 129/84   Pulse (!) 124   Ht 5\' 3"  (1.6 m)   Wt 119 lb (54 kg)   BMI 21.08 kg/m    No physical exam performed due to telemedicine visit ASSESSMENT &  PLAN:    1. Gastroenteritis - ondansetron (ZOFRAN ODT) 4 MG disintegrating tablet; Take 1 tablet (4 mg total) by mouth every 8 (eight) hours as needed for nausea or vomiting.  Dispense: 20 tablet; Refill: 0 - diphenoxylate-atropine (LOMOTIL) 2.5-0.025 MG tablet; Take 1 tablet by mouth 4 (four) times daily as needed for diarrhea or loose stools.  Dispense: 30 tablet; Refill: 0  Push fluids Small frequent meals BRAT diet If symptoms become severe seek emergency medical care If symptoms fail to improve notify office  COVID-19 Education: The signs and symptoms of COVID-19 were discussed with the patient and how to seek care for testing (follow up with  PCP or arrange E-visit). The importance of social distancing was discussed today.   I spent 7 minutes dedicated to the care of this patient on the date of this encounter to include telephone time with the patient, as well as:EMR review and prescription medication management.  Follow Up:  Virtual Visit  prn  Signed,  [name], NP  11/26/2020 1:56 PM    [facility]

## 2020-11-26 NOTE — Telephone Encounter (Signed)
Pt left VM stating she had chills, low appetite yesterday.   Called pt back. Pt states n/v, diarrhea, chills, low appetite, headache Denies fever, sore throat. Has not tried eating today, can drink water. Has just taken normal daily baby aspirin.   Had pacemaker placed almost two months ago. Made telephone same day visit for pt.   Lorita Officer, [place] 11/26/20 11:34 AM

## 2020-11-27 ENCOUNTER — Encounter: Payer: Self-pay | Admitting: Neurology

## 2020-11-27 NOTE — Telephone Encounter (Signed)
Agree. Avoid caffeine and ETOH. Watchful waiting.

## 2020-12-10 ENCOUNTER — Other Ambulatory Visit: Payer: Self-pay

## 2020-12-10 DIAGNOSIS — N6310 Unspecified lump in the right breast, unspecified quadrant: Secondary | ICD-10-CM

## 2020-12-10 DIAGNOSIS — Z1231 Encounter for screening mammogram for malignant neoplasm of breast: Secondary | ICD-10-CM

## 2020-12-13 ENCOUNTER — Telehealth: Payer: Self-pay

## 2020-12-13 NOTE — Telephone Encounter (Signed)
Patient has been scheduled for a Diagnostic Mammogram unilateral right side with ultrasound for 12/23/2020 @ 10:10 for a 10:40AM. Patient is aware.

## 2020-12-17 ENCOUNTER — Ambulatory Visit: Payer: 59 | Admitting: Nurse Practitioner

## 2020-12-24 ENCOUNTER — Telehealth: Payer: Self-pay

## 2020-12-24 NOTE — Telephone Encounter (Signed)
Biotronik alert received HVR alert 12/20/20. EGM appears AS/VS 160's with duration of 2 minutes 36 seconds. Patient reports she was unaware of elevated heart rate and has felt well. Currently is not taking any medication such as BB. Advised patient to call if she becomes symptomatic. Advised I will forward to Dr. Ladona Ridgel for review and we will call with any changes. Patient agreeable to plan.

## 2020-12-25 ENCOUNTER — Encounter: Payer: Self-pay | Admitting: Nurse Practitioner

## 2020-12-25 ENCOUNTER — Other Ambulatory Visit: Payer: Self-pay

## 2020-12-25 ENCOUNTER — Ambulatory Visit (INDEPENDENT_AMBULATORY_CARE_PROVIDER_SITE_OTHER): Payer: 59 | Admitting: Nurse Practitioner

## 2020-12-25 VITALS — BP 98/68 | HR 89 | Temp 97.8°F | Ht 64.0 in | Wt 117.0 lb

## 2020-12-25 DIAGNOSIS — E782 Mixed hyperlipidemia: Secondary | ICD-10-CM

## 2020-12-25 DIAGNOSIS — I639 Cerebral infarction, unspecified: Secondary | ICD-10-CM | POA: Diagnosis not present

## 2020-12-25 DIAGNOSIS — F419 Anxiety disorder, unspecified: Secondary | ICD-10-CM | POA: Diagnosis not present

## 2020-12-25 DIAGNOSIS — J449 Chronic obstructive pulmonary disease, unspecified: Secondary | ICD-10-CM

## 2020-12-25 NOTE — Progress Notes (Signed)
Established Patient Office Visit  Subjective:  Patient ID: Leslie Franklin, female    DOB: [birth year]  Age: 60 y.o. MRN: [id number]  CC: follow up hyperlipidemia   HPI [name] presents for follow up hyperlipidemia, anxiety, and COPD. [name] recently had a diagnostic mammogram that revealed a benign cyst. Will repeat mammogram in 1-year.   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 She states she has not experienced syncope after pacemaker insertion.   [name] has a head CT for syncopal episodes on 11/12/20 that revealed 2 infarcts. She is scheduled to see neurology 01/31/21. Will order bilateral carotid doppler US.  Hyperlipidemia [name] was diagnosed with hyperlipidemia in 2022. Current treatment includes Rosuvastatin 20 mg daily. Lipid panel on 09/13/20 revealed TC 292, Trig 128, HDL 51, and LDL 218. She has stopped smoking cigarettes. She tries to follow a heart healthy diet. 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 She denies myalgias or arthralgias of statin therapy.   Anxiety [name] was diagnosed with anxiety several years ago. Current treatment includes Zoloft 50 mg and Hydroxyzine 25 mg TID PRN. She is prescribed Trazodone 50 mg QHS for sleep. She tells me that she has not been taking Zoloft everyday. She was encouraged to take Zoloft 50 mg daily to decrease anxiety symptoms.  COPD [name] was diagnosed with COPD an unknown time ago. She is not currently using inhalers for treatment. She denies cough or dyspnea. She is no longer smoking cigarettes.  Past Medical History:  Diagnosis Date  . Anxiety 09/13/2020  . Cardiac murmur 09/18/2020  . Chest pain of uncertain etiology 09/18/2020  . Claudication (HCC) 09/12/2020  . COPD (chronic obstructive pulmonary disease) (HCC)   . Depression   . Ex-smoker 09/18/2020  . Hyperlipidemia   . Palpitations 09/18/2020  . Screening mammogram for breast cancer 09/12/2020  . Syncope 09/12/2020     Past Surgical History:  Procedure Laterality Date  . NO PAST SURGERIES    . PACEMAKER IMPLANT N/A 10/21/2020   Procedure: PACEMAKER IMPLANT;  Surgeon: Marinus Maw, MD;  Location: [facility] INVASIVE CV LAB;  Service: Cardiovascular;  Laterality: N/A;    Family History  Problem Relation Age of Onset  . Hypertension Mother   . Diabetes Mother   . Heart attack Father   . Emphysema Sister   . Stroke Brother     Social History   Socioeconomic History  . Marital status: Single    Spouse name: Not on file  . Number of children: 4  . Years of education: Not on file  . Highest education level: Not on file  Occupational History  . Not on file  Tobacco Use  . Smoking status: Former Smoker    Packs/day: 1.00    Years: 41.00    Pack years: 41.00  . Smokeless tobacco: Never Used  Vaping Use  . Vaping Use: Never used  Substance and Sexual Activity  . Alcohol use: Never  . Drug use: Never  . Sexual activity: Not on file  Other Topics Concern  . Not on file  Social History Narrative  . Not on file      Outpatient Medications Prior to Visit  Medication Sig Dispense Refill  . albuterol (VENTOLIN HFA) 108 (90 Base) MCG/ACT inhaler Inhale 2 puffs into the lungs every 6 (six) hours as needed for wheezing or shortness of breath. 8 g 0  . aspirin 81 MG chewable tablet Chew 81 mg by mouth  daily.    . diphenoxylate-atropine (LOMOTIL) 2.5-0.025 MG tablet Take 1 tablet by mouth 4 (four) times daily as needed for diarrhea or loose stools. 30 tablet 0  . GAVILYTE-G 236 g solution See admin instructions.    . hydrOXYzine (ATARAX/VISTARIL) 25 MG tablet Take 25 mg by mouth 3 (three) times daily as needed for anxiety.    . ondansetron (ZOFRAN ODT) 4 MG disintegrating tablet Take 1 tablet (4 mg total) by mouth every 8 (eight) hours as needed for nausea or vomiting. 20 tablet 0  . sertraline (ZOLOFT) 50 MG tablet Take 1 tablet (50 mg total) by mouth daily. 90 tablet 0  . traZODone (DESYREL) 50 MG  tablet Take 1 tablet (50 mg total) by mouth at bedtime. 30 tablet 3  . rosuvastatin (CRESTOR) 20 MG tablet Take 1 tablet (20 mg total) by mouth daily. 90 tablet 3   No facility-administered medications prior to visit.    No Known Allergies  ROS Review of Systems  Constitutional: Negative for appetite change, fatigue and unexpected weight change.  HENT: Negative for congestion, ear pain, rhinorrhea, sinus pressure, sinus pain and tinnitus.   Eyes: Negative for pain.  Respiratory: Negative for cough and shortness of breath.   Cardiovascular: Negative for chest pain, palpitations and leg swelling.  Gastrointestinal: Negative for abdominal pain, constipation, diarrhea, nausea and vomiting.  Endocrine: Negative for cold intolerance, heat intolerance, polydipsia, polyphagia and polyuria.  Genitourinary: Negative for dysuria, frequency and hematuria.  Musculoskeletal: Negative for arthralgias, back pain, joint swelling and myalgias.  Skin: Negative for rash.  Allergic/Immunologic: Negative for environmental allergies.  Neurological: Negative for dizziness and headaches.  Hematological: Negative for adenopathy.  Psychiatric/Behavioral: Negative for decreased concentration and sleep disturbance. The patient is not nervous/anxious.       Objective:    Physical Exam Constitutional:      Appearance: Normal appearance.  HENT:     Head: Normocephalic.     Right Ear: Tympanic membrane normal.     Left Ear: Tympanic membrane normal.     Nose: Nose normal.     Mouth/Throat:     Mouth: Mucous membranes are moist.  Neck:     Vascular: No carotid bruit.  Cardiovascular:     Rate and Rhythm: Normal rate and regular rhythm.     Pulses: Normal pulses.     Heart sounds: Normal heart sounds.  Pulmonary:     Effort: Pulmonary effort is normal.     Breath sounds: Normal breath sounds.  Abdominal:     General: Bowel sounds are normal.     Palpations: Abdomen is soft.     Tenderness: There is  no abdominal tenderness. There is no guarding.  Musculoskeletal:        General: No swelling.  Skin:    General: Skin is warm and dry.     Capillary Refill: Capillary refill takes less than 2 seconds.  Neurological:     Mental Status: She is alert and oriented to person, place, and time.  Psychiatric:        Mood and Affect: Mood normal.        Behavior: Behavior normal.     BP 98/68 (BP Location: Left Arm, Patient Position: Sitting)   Pulse 89   Temp 97.8 F (36.6 C) (Temporal)   Ht 5\' 4"  (1.626 m)   Wt 117 lb (53.1 kg)   SpO2 94%   BMI 20.08 kg/m  Wt Readings from Last 3 Encounters:  12/25/20 117 lb (53.1  kg)  11/26/20 119 lb (54 kg)  11/20/20 119 lb 8 oz (54.2 kg)     Health Maintenance Due  Topic Date Due  . Hepatitis C Screening  Never done  . HIV Screening  Never done  . TETANUS/TDAP  Never done  . COLONOSCOPY (Pts 45-13yrs Insurance coverage will need to be confirmed)  Never done      Lab Results  Component Value Date   TSH 1.220 09/13/2020   Lab Results  Component Value Date   WBC 9.8 10/18/2020   HGB 15.2 10/18/2020   HCT 45.4 10/18/2020   MCV 88 10/18/2020   PLT 339 10/18/2020   Lab Results  Component Value Date   NA 138 10/18/2020   K 4.3 10/18/2020   CO2 20 10/18/2020   GLUCOSE 93 10/18/2020   BUN 12 10/18/2020   CREATININE 0.83 10/18/2020   BILITOT 0.5 09/13/2020   ALKPHOS 111 09/13/2020   AST 19 09/13/2020   ALT 12 09/13/2020   PROT 6.7 09/13/2020   ALBUMIN 4.2 09/13/2020   CALCIUM 9.7 10/18/2020   Lab Results  Component Value Date   CHOL 292 (H) 09/13/2020   Lab Results  Component Value Date   HDL 51 09/13/2020   Lab Results  Component Value Date   LDLCALC 218 (H) 09/13/2020   Lab Results  Component Value Date   TRIG 128 09/13/2020   Lab Results  Component Value Date   CHOLHDL 5.7 (H) 09/13/2020       Assessment & Plan:   1. Mixed hyperlipidemia - CBC With Diff/Platelet - Comprehensive metabolic panel -  Lipid panel  2. Anxiety  3. Chronic obstructive pulmonary disease, unspecified COPD type (HCC) - CBC With Diff/Platelet - Comprehensive metabolic panel   Take Zoloft 50 mg every day for anxiety Take Hydroxyzine as needed for anxiety Continue Crestor 20 mg daily Continue physical activity and heart healthy diet Follow-up with neurology and cardiology as scheduled We will call you with lab results We will check on pacemaker compatibility with MRI machine, possible schedule MRI brain Return in 58-months fasting or sooner if needed    Follow-up: 51-months    [name], NP

## 2020-12-25 NOTE — Patient Instructions (Addendum)
Take Zoloft 50 mg every day for anxiety Take Hydroxyzine as needed for anxiety Continue Crestor 20 mg daily Continue physical activity and heart healthy diet Follow-up with neurology and cardiology as scheduled We will call you with lab results We will check on pacemaker compatibility with MRI machine, possible schedule MRI brain Return in 29-months fasting or sooner if needed  Cholesterol Content in Foods Cholesterol is a waxy, fat-like substance that helps to carry fat in the blood. The body needs cholesterol in small amounts, but too much cholesterol can cause damage to the arteries and heart. Most people should eat less than 200 milligrams (mg) of cholesterol a day. Foods with cholesterol Cholesterol is found in animal-based foods, such as meat, seafood, and dairy. Generally, low-fat dairy and lean meats have less cholesterol than full-fat dairy and fatty meats. The milligrams of cholesterol per serving (mg per serving) of common cholesterol-containing foods are listed below. Meat and other proteins  Egg -- one large whole egg has 186 mg.  Veal shank -- 4 oz has 141 mg.  Lean ground [place] (93% lean) -- 4 oz has 118 mg.  Fat-trimmed lamb loin -- 4 oz has 106 mg.  Lean ground beef (90% lean) -- 4 oz has 100 mg.  Lobster -- 3.5 oz has 90 mg.  Pork loin chops -- 4 oz has 86 mg.  Canned salmon -- 3.5 oz has 83 mg.  Fat-trimmed beef top loin -- 4 oz has 78 mg.  Frankfurter -- 1 frank (3.5 oz) has 77 mg.  Crab -- 3.5 oz has 71 mg.  Roasted chicken without skin, white meat -- 4 oz has 66 mg.  Light bologna -- 2 oz has 45 mg.  Deli-cut [place] -- 2 oz has 31 mg.  Canned tuna -- 3.5 oz has 31 mg.  Tomasa Blase -- 1 oz has 29 mg.  Oysters and mussels (raw) -- 3.5 oz has 25 mg.  Mackerel -- 1 oz has 22 mg.  Trout -- 1 oz has 20 mg.  Pork sausage -- 1 link (1 oz) has 17 mg.  Salmon -- 1 oz has 16 mg.  Tilapia -- 1 oz has 14 mg. Dairy  Soft-serve ice cream --  cup (4 oz)  has 103 mg.  Whole-milk yogurt -- 1 cup (8 oz) has 29 mg.  Cheddar cheese -- 1 oz has 28 mg.  American cheese -- 1 oz has 28 mg.  Whole milk -- 1 cup (8 oz) has 23 mg.  2% milk -- 1 cup (8 oz) has 18 mg.  Cream cheese -- 1 tablespoon (Tbsp) has 15 mg.  Cottage cheese --  cup (4 oz) has 14 mg.  Low-fat (1%) milk -- 1 cup (8 oz) has 10 mg.  Sour cream -- 1 Tbsp has 8.5 mg.  Low-fat yogurt -- 1 cup (8 oz) has 8 mg.  Nonfat Greek yogurt -- 1 cup (8 oz) has 7 mg.  Half-and-half cream -- 1 Tbsp has 5 mg. Fats and oils  Cod liver oil -- 1 tablespoon (Tbsp) has 82 mg.  Butter -- 1 Tbsp has 15 mg.  Lard -- 1 Tbsp has 14 mg.  Bacon grease -- 1 Tbsp has 14 mg.  Mayonnaise -- 1 Tbsp has 5-10 mg.  Margarine -- 1 Tbsp has 3-10 mg. Exact amounts of cholesterol in these foods may vary depending on specific ingredients and brands.   Foods without cholesterol Most plant-based foods do not have cholesterol unless you combine them with a  food that has cholesterol. Foods without cholesterol include:  Grains and cereals.  Vegetables.  Fruits.  Vegetable oils, such as olive, canola, and sunflower oil.  Legumes, such as peas, beans, and lentils.  Nuts and seeds.  Egg whites.   Summary  The body needs cholesterol in small amounts, but too much cholesterol can cause damage to the arteries and heart.  Most people should eat less than 200 milligrams (mg) of cholesterol a day. This information is not intended to replace advice given to you by your health care provider. Make sure you discuss any questions you have with your health care provider. Document Revised: 01/08/2020 Document Reviewed: 01/08/2020 Elsevier Patient Education  2021 Elsevier Inc.    Managing Anxiety, Adult After being diagnosed with an anxiety disorder, you may be relieved to know why you have felt or behaved a certain way. You may also feel overwhelmed about the treatment ahead and what it will mean for  your life. With care and support, you can manage this condition and recover from it. How to manage lifestyle changes Managing stress and anxiety Stress is your body's reaction to life changes and events, both good and bad. Most stress will last just a few hours, but stress can be ongoing and can lead to more than just stress. Although stress can play a major role in anxiety, it is not the same as anxiety. Stress is usually caused by something external, such as a deadline, test, or competition. Stress normally passes after the triggering event has ended.  Anxiety is caused by something internal, such as imagining a terrible outcome or worrying that something will go wrong that will devastate you. Anxiety often does not go away even after the triggering event is over, and it can become long-term (chronic) worry. It is important to understand the differences between stress and anxiety and to manage your stress effectively so that it does not lead to an anxious response. Talk with your health care provider or a counselor to learn more about reducing anxiety and stress. He or she may suggest tension reduction techniques, such as:  Music therapy. This can include creating or listening to music that you enjoy and that inspires you.  Mindfulness-based meditation. This involves being aware of your normal breaths while not trying to control your breathing. It can be done while sitting or walking.  Centering prayer. This involves focusing on a word, phrase, or sacred image that means something to you and brings you peace.  Deep breathing. To do this, expand your stomach and inhale slowly through your nose. Hold your breath for 3-5 seconds. Then exhale slowly, letting your stomach muscles relax.  Self-talk. This involves identifying thought patterns that lead to anxiety reactions and changing those patterns.  Muscle relaxation. This involves tensing muscles and then relaxing them. Choose a tension reduction  technique that suits your lifestyle and personality. These techniques take time and practice. Set aside 5-15 minutes a day to do them. Therapists can offer counseling and training in these techniques. The training to help with anxiety may be covered by some insurance plans. Other things you can do to manage stress and anxiety include:  Keeping a stress/anxiety diary. This can help you learn what triggers your reaction and then learn ways to manage your response.  Thinking about how you react to certain situations. You may not be able to control everything, but you can control your response.  Making time for activities that help you relax and not feeling guilty  about spending your time in this way.  Visual imagery and yoga can help you stay calm and relax.   Medicines Medicines can help ease symptoms. Medicines for anxiety include:  Anti-anxiety drugs.  Antidepressants. Medicines are often used as a primary treatment for anxiety disorder. Medicines will be prescribed by a health care provider. When used together, medicines, psychotherapy, and tension reduction techniques may be the most effective treatment. Relationships Relationships can play a big part in helping you recover. Try to spend more time connecting with trusted friends and family members. Consider going to couples counseling, taking family education classes, or going to family therapy. Therapy can help you and others better understand your condition. How to recognize changes in your anxiety Everyone responds differently to treatment for anxiety. Recovery from anxiety happens when symptoms decrease and stop interfering with your daily activities at home or work. This may mean that you will start to:  Have better concentration and focus. Worry will interfere less in your daily thinking.  Sleep better.  Be less irritable.  Have more energy.  Have improved memory. It is important to recognize when your condition is getting  worse. Contact your health care provider if your symptoms interfere with home or work and you feel like your condition is not improving. Follow these instructions at home: Activity  Exercise. Most adults should do the following: ? Exercise for at least 150 minutes each week. The exercise should increase your heart rate and make you sweat (moderate-intensity exercise). ? Strengthening exercises at least twice a week.  Get the right amount and quality of sleep. Most adults need 7-9 hours of sleep each night. Lifestyle  Eat a healthy diet that includes plenty of vegetables, fruits, whole grains, low-fat dairy products, and lean protein. Do not eat a lot of foods that are high in solid fats, added sugars, or salt.  Make choices that simplify your life.  Do not use any products that contain nicotine or tobacco, such as cigarettes, e-cigarettes, and chewing tobacco. If you need help quitting, ask your health care provider.  Avoid caffeine, alcohol, and certain over-the-counter cold medicines. These may make you feel worse. Ask your pharmacist which medicines to avoid.   General instructions  Take over-the-counter and prescription medicines only as told by your health care provider.  Keep all follow-up visits as told by your health care provider. This is important. Where to find support You can get help and support from these sources:  Self-help groups.  Online and [employer].  A trusted spiritual leader.  Couples counseling.  Family education classes.  Family therapy. Where to find more information You may find that joining a support group helps you deal with your anxiety. The following sources can help you locate counselors or support groups near you:  Mental Health America: www.mentalhealthamerica.net  Anxiety and Depression Association of [place] (ADAA): ProgramCam.de  [employer] on Mental Illness (NAMI): www.nami.org Contact a health care provider if  you:  Have a hard time staying focused or finishing daily tasks.  Spend many hours a day feeling worried about everyday life.  Become exhausted by worry.  Start to have headaches, feel tense, or have nausea.  Urinate more than normal.  Have diarrhea. Get help right away if you have:  A racing heart and shortness of breath.  Thoughts of hurting yourself or others. If you ever feel like you may hurt yourself or others, or have thoughts about taking your own life, get help right away. You can  go to your nearest emergency department or call:  Your local emergency services (911 in the [place]  A suicide crisis helpline, such as the National Suicide Prevention Lifeline at 435-396-9968. This is open 24 hours a day. Summary  Taking steps to learn and use tension reduction techniques can help calm you and help prevent triggering an anxiety reaction.  When used together, medicines, psychotherapy, and tension reduction techniques may be the most effective treatment.  Family, friends, and partners can play a big part in helping you recover from an anxiety disorder. This information is not intended to replace advice given to you by your health care provider. Make sure you discuss any questions you have with your health care provider. Document Revised: 01/17/2019 Document Reviewed: 01/17/2019 Elsevier Patient Education  2021 [employer].

## 2020-12-26 LAB — COMPREHENSIVE METABOLIC PANEL
ALT: 20 IU/L (ref 0–32)
AST: 27 IU/L (ref 0–40)
Albumin/Globulin Ratio: 1.7 (ref 1.2–2.2)
Albumin: 4.5 g/dL (ref 3.8–4.9)
Alkaline Phosphatase: 118 IU/L (ref 44–121)
BUN/Creatinine Ratio: 14 (ref 9–23)
BUN: 14 mg/dL (ref 6–24)
Bilirubin Total: 0.4 mg/dL (ref 0.0–1.2)
CO2: 23 mmol/L (ref 20–29)
Calcium: 9.9 mg/dL (ref 8.7–10.2)
Chloride: 104 mmol/L (ref 96–106)
Creatinine, Ser: 1.02 mg/dL — ABNORMAL HIGH (ref 0.57–1.00)
Globulin, Total: 2.6 g/dL (ref 1.5–4.5)
Glucose: 95 mg/dL (ref 65–99)
Potassium: 4.4 mmol/L (ref 3.5–5.2)
Sodium: 142 mmol/L (ref 134–144)
Total Protein: 7.1 g/dL (ref 6.0–8.5)
eGFR: 64 mL/min/{1.73_m2} (ref >59)

## 2020-12-26 LAB — CBC WITH DIFF/PLATELET
Basophils Absolute: 0.1 10*3/uL (ref 0.0–0.2)
Basos: 1 %
EOS (ABSOLUTE): 0.2 10*3/uL (ref 0.0–0.4)
Eos: 3 %
Hematocrit: 46.9 % — ABNORMAL HIGH (ref 34.0–46.6)
Hemoglobin: 15.8 g/dL (ref 11.1–15.9)
Immature Grans (Abs): 0 10*3/uL (ref 0.0–0.1)
Immature Granulocytes: 0 %
Lymphocytes Absolute: 2.7 10*3/uL (ref 0.7–3.1)
Lymphs: 33 %
MCH: 29.1 pg (ref 26.6–33.0)
MCHC: 33.7 g/dL (ref 31.5–35.7)
MCV: 86 fL (ref 79–97)
Monocytes Absolute: 0.6 10*3/uL (ref 0.1–0.9)
Monocytes: 7 %
Neutrophils Absolute: 4.7 10*3/uL (ref 1.4–7.0)
Neutrophils: 56 %
Platelets: 260 10*3/uL (ref 150–450)
RBC: 5.43 x10E6/uL — ABNORMAL HIGH (ref 3.77–5.28)
RDW: 12.3 % (ref 11.7–15.4)
WBC: 8.3 10*3/uL (ref 3.4–10.8)

## 2020-12-26 LAB — LIPID PANEL
Chol/HDL Ratio: 3.2 ratio (ref 0.0–4.4)
Cholesterol, Total: 186 mg/dL (ref 100–199)
HDL: 59 mg/dL (ref >39)
LDL Chol Calc (NIH): 110 mg/dL — ABNORMAL HIGH (ref 0–99)
Triglycerides: 96 mg/dL (ref 0–149)
VLDL Cholesterol Cal: 17 mg/dL (ref 5–40)

## 2020-12-26 LAB — CARDIOVASCULAR RISK ASSESSMENT

## 2021-01-01 ENCOUNTER — Telehealth: Payer: Self-pay | Admitting: Nurse Practitioner

## 2021-01-01 NOTE — Telephone Encounter (Signed)
No change in treatment

## 2021-01-01 NOTE — Telephone Encounter (Signed)
   Leslie Franklin has been scheduled for the following appointment:  WHAT: Vascular ultrasound WHERE: [facility] DATE: 01/06/21 TIME: 1:30  Patient has been made aware.

## 2021-01-08 ENCOUNTER — Other Ambulatory Visit: Payer: Self-pay

## 2021-01-08 DIAGNOSIS — I639 Cerebral infarction, unspecified: Secondary | ICD-10-CM

## 2021-01-13 ENCOUNTER — Encounter: Payer: Self-pay | Admitting: Unknown Physician Specialty

## 2021-01-13 LAB — HM COLONOSCOPY

## 2021-01-14 ENCOUNTER — Other Ambulatory Visit: Payer: Self-pay

## 2021-01-14 DIAGNOSIS — F331 Major depressive disorder, recurrent, moderate: Secondary | ICD-10-CM

## 2021-01-14 MED ORDER — SERTRALINE HCL 50 MG PO TABS: 50.0000 mg | ORAL_TABLET | Freq: Every day | ORAL | 0 refills | Status: DC

## 2021-01-14 MED ORDER — ROSUVASTATIN CALCIUM 20 MG PO TABS
20.0000 mg | ORAL_TABLET | Freq: Every day | ORAL | 3 refills | Status: DC
Start: 2021-01-14 — End: 2021-03-27

## 2021-01-14 MED ORDER — HYDROXYZINE HCL 25 MG PO TABS
25.0000 mg | ORAL_TABLET | Freq: Three times a day (TID) | ORAL | 0 refills | Status: DC | PRN
Start: 2021-01-14 — End: 2021-10-09

## 2021-01-20 ENCOUNTER — Ambulatory Visit (INDEPENDENT_AMBULATORY_CARE_PROVIDER_SITE_OTHER): Payer: 59

## 2021-01-20 DIAGNOSIS — I442 Atrioventricular block, complete: Secondary | ICD-10-CM

## 2021-01-21 ENCOUNTER — Ambulatory Visit (INDEPENDENT_AMBULATORY_CARE_PROVIDER_SITE_OTHER): Payer: 59 | Admitting: Internal Medicine

## 2021-01-21 ENCOUNTER — Encounter: Payer: Self-pay | Admitting: Internal Medicine

## 2021-01-21 ENCOUNTER — Other Ambulatory Visit: Payer: Self-pay

## 2021-01-21 VITALS — BP 122/78 | HR 85 | Ht 63.0 in | Wt 119.0 lb

## 2021-01-21 DIAGNOSIS — I442 Atrioventricular block, complete: Secondary | ICD-10-CM | POA: Diagnosis not present

## 2021-01-21 DIAGNOSIS — Z950 Presence of cardiac pacemaker: Secondary | ICD-10-CM

## 2021-01-21 NOTE — Progress Notes (Signed)
HPI Leslie Franklin returns today for ongoing followup. She is a pleasant 60 yo woman with syncope who was found to have daytime heart block and underwent PPM insertion about 3 months ago. She has  In the interim, she notes COPD and dyslipidemia. She feels well and has not passed out. No Known Allergies   Current Outpatient Medications  Medication Sig Dispense Refill  . albuterol (VENTOLIN HFA) 108 (90 Base) MCG/ACT inhaler Inhale 2 puffs into the lungs every 6 (six) hours as needed for wheezing or shortness of breath. 8 g 0  . aspirin 81 MG chewable tablet Chew 81 mg by mouth daily.    . diphenoxylate-atropine (LOMOTIL) 2.5-0.025 MG tablet Take 1 tablet by mouth 4 (four) times daily as needed for diarrhea or loose stools. 30 tablet 0  . GAVILYTE-G 236 g solution See admin instructions.    . hydrOXYzine (ATARAX/VISTARIL) 25 MG tablet Take 1 tablet (25 mg total) by mouth 3 (three) times daily as needed for anxiety. 30 tablet 0  . ondansetron (ZOFRAN ODT) 4 MG disintegrating tablet Take 1 tablet (4 mg total) by mouth every 8 (eight) hours as needed for nausea or vomiting. 20 tablet 0  . rosuvastatin (CRESTOR) 20 MG tablet Take 1 tablet (20 mg total) by mouth daily. 90 tablet 3  . sertraline (ZOLOFT) 50 MG tablet Take 1 tablet (50 mg total) by mouth daily. 90 tablet 0  . traZODone (DESYREL) 50 MG tablet Take 1 tablet (50 mg total) by mouth at bedtime. 30 tablet 3   No current facility-administered medications for this visit.     Past Medical History:  Diagnosis Date  . Anxiety 09/13/2020  . Cardiac murmur 09/18/2020  . Chest pain of uncertain etiology 09/18/2020  . Claudication (HCC) 09/12/2020  . COPD (chronic obstructive pulmonary disease) (HCC)   . Depression   . Ex-smoker 09/18/2020  . Hyperlipidemia   . Palpitations 09/18/2020  . Screening mammogram for breast cancer 09/12/2020  . Syncope 09/12/2020    ROS:   All systems reviewed and negative except as noted in the  HPI.   Past Surgical History:  Procedure Laterality Date  . NO PAST SURGERIES    . PACEMAKER IMPLANT N/A 10/21/2020   Procedure: PACEMAKER IMPLANT;  Surgeon: Marinus Maw, MD;  Location: [facility] INVASIVE CV LAB;  Service: Cardiovascular;  Laterality: N/A;     Family History  Problem Relation Age of Onset  . Hypertension Mother   . Diabetes Mother   . Heart attack Father   . Emphysema Sister   . Stroke Brother      Social History   Socioeconomic History  . Marital status: Single    Spouse name: Not on file  . Number of children: 4  . Years of education: Not on file  . Highest education level: Not on file  Occupational History  . Not on file  Tobacco Use  . Smoking status: Former Smoker    Packs/day: 1.00    Years: 41.00    Pack years: 41.00  . Smokeless tobacco: Never Used  Vaping Use  . Vaping Use: Never used  Substance and Sexual Activity  . Alcohol use: Never  . Drug use: Never  . Sexual activity: Not on file  Other Topics Concern  . Not on file  Social History Narrative  . Not on file   Social Determinants of Health   Financial Resource Strain: Not on file  Food Insecurity: Not on file  Transportation  Needs: Not on file  Physical Activity: Not on file  Stress: Not on file  Social Connections: Not on file  Intimate Partner Violence: Not on file     BP 122/78   Pulse 85   Ht 5\' 3"  (1.6 m)   Wt 119 lb (54 kg)   SpO2 97%   BMI 21.08 kg/m   Physical Exam:  Well appearing NAD HEENT: Unremarkable Neck:  No JVD, no thyromegally Lymphatics:  No adenopathy Back:  No CVA tenderness Lungs:  Clear HEART:  Regular rate rhythm, no murmurs, no rubs, no clicks Abd:  soft, positive bowel sounds, no organomegally, no rebound, no guarding Ext:  2 plus pulses, no edema, no cyanosis, no clubbing Skin:  No rashes no nodules Neuro:  CN II through XII intact, motor grossly intact  EKG - nsr with atrial pacing  DEVICE  Normal device function.  See PaceArt  for details.   Assess/Plan: 1. Heart block/syncope - she is conducting today and not V pacing. She is pacing 30% of the time. 2. PPM - her Biotronik DDD PM is working normally. 3. AT - she has had a few seconds to a minute of AT/AF. She will undergo watchful waiting.   Teven Mittman,MD

## 2021-01-21 NOTE — Patient Instructions (Signed)
Medication Instructions:  Your physician recommends that you continue on your current medications as directed. Please refer to the Current Medication list given to you today.  Labwork: None ordered.  Testing/Procedures: None ordered.  Follow-Up: Your physician wants you to follow-up in: one year with Dr. Elberta Fortis in [place]  Remote monitoring is used to monitor your Pacemaker from home. This monitoring reduces the number of office visits required to check your device to one time per year. It allows [place] to keep an eye on the functioning of your device to ensure it is working properly. You are scheduled for a device check from home on 04/21/2021. You may send your transmission at any time that day. If you have a wireless device, the transmission will be sent automatically. After your physician reviews your transmission, you will receive a postcard with your next transmission date.  Any Other Special Instructions Will Be Listed Below (If Applicable).  If you need a refill on your cardiac medications before your next appointment, please call your pharmacy.

## 2021-01-22 LAB — CUP PACEART REMOTE DEVICE CHECK
Date Time Interrogation Session: 20220524111032
Implantable Lead Implant Date: 20220221
Implantable Lead Implant Date: 20220221
Implantable Lead Location: 753859
Implantable Lead Location: 753860
Implantable Lead Model: 377171
Implantable Lead Model: 377171
Implantable Lead Serial Number: 8000187789
Implantable Lead Serial Number: 8000197459
Implantable Pulse Generator Implant Date: 20220221
Pulse Gen Model: 407145
Pulse Gen Serial Number: 70053960

## 2021-01-29 NOTE — Progress Notes (Addendum)
NEUROLOGY CONSULTATION NOTE  Leslie Franklin MRN: [id number] DOB: 08-02-1961  Referring provider: Flonnie Hailstone, NP Primary care provider: Flonnie Hailstone, NP  Reason for consult:  Cerebellar infarct  Assessment/Plan:   1.  Two small age-indeterminate infarcts on brain CT (right frontal and right cerebellar hemisphere) 2.  Dizziness - description sounds like near-syncope, especially with detection of heart block, but she reports that (although improved) she still feels dizzy, so will evaluate for vertebrobasilar insufficiency 3.  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  Hyperlipidemia 5.  History of tobacco abuse  1.  Check MRI brain and MRA of head and neck 2.  Continue current medications for secondary stroke prevention: -  ASA 81mg  daily -  Crestor -  Normotensive blood pressure 3.  Follow up after testing.   Subjective:  Leslie Franklin is a 60 year old right-handed female with COPD, HLD, depression and history of complete heart block s/p PPM who presents for cerebellar infarct.  History supplemented by cardiology, vascular and referring provider's notes.  She is accompanied by her daughter.    She began experiencing recurrent dizziness and syncope for maybe 10 years but started becoming worse since 2018.  The dizziness feels like "getting up too fast".  It occurs when standing, moving or sitting.  It lasts a couple of minutes.  Usually will lay down.  It is associated with tunnel vision, palpitations, diaphoresis and feeling hot.  No nausea.  She sometimes feels like she is going to pass out and has actually passed out at least three times.  No postictal confusion, tongue/cheek laceration or incontinence.  She has been evaluated by cardiology.  Echocardiogram on 10/14/2020 was unremarkable with EF 60-65% with no valvular abnormalities or PFO/IAS.  Cardiac monitor has recorded episodes of daytime complete heart block.  She had a pacemaker implanted on 10/21/2020.  To further  evaluate symptoms, she had a CT head on 11/12/2020 which demonstrated age indeterminate small lacunar infarcts in the right frontal cortical regiion and right cerebellar hemisphere.  She was started on ASA 81mg  daily.  Carotid ultrasound on 01/06/2021 showed less than 50% stenosis in the bilateral carotid arteries and antegrade flow in the vertebral arteries bilaterally.  She was started on Crestor.  LDL from 12/25/2020 was 110 (down from 218 in January).  TSH from 09/13/2020 was 1.220.  Since the pacemaker, she has less dizzy spells but they still occur.  They occur daily.       PAST MEDICAL HISTORY: Past Medical History:  Diagnosis Date  . Anxiety 09/13/2020  . Cardiac murmur 09/18/2020  . Chest pain of uncertain etiology 09/18/2020  . Claudication (HCC) 09/12/2020  . COPD (chronic obstructive pulmonary disease) (HCC)   . Depression   . Ex-smoker 09/18/2020  . Hyperlipidemia   . Palpitations 09/18/2020  . Screening mammogram for breast cancer 09/12/2020  . Syncope 09/12/2020    PAST SURGICAL HISTORY: Past Surgical History:  Procedure Laterality Date  . NO PAST SURGERIES    . PACEMAKER IMPLANT N/A 10/21/2020   Procedure: PACEMAKER IMPLANT;  Surgeon: 09/14/2020, MD;  Location: [facility] INVASIVE CV LAB;  Service: Cardiovascular;  Laterality: N/A;    MEDICATIONS: Current Outpatient Medications on File Prior to Visit  Medication Sig Dispense Refill  . albuterol (VENTOLIN HFA) 108 (90 Base) MCG/ACT inhaler Inhale 2 puffs into the lungs every 6 (six) hours as needed for wheezing or shortness of breath. 8 g 0  . aspirin 81 MG chewable tablet Chew 81 mg  by mouth daily.    . diphenoxylate-atropine (LOMOTIL) 2.5-0.025 MG tablet Take 1 tablet by mouth 4 (four) times daily as needed for diarrhea or loose stools. 30 tablet 0  . GAVILYTE-G 236 g solution See admin instructions.    . hydrOXYzine (ATARAX/VISTARIL) 25 MG tablet Take 1 tablet (25 mg total) by mouth 3 (three) times daily as needed for anxiety. 30  tablet 0  . ondansetron (ZOFRAN ODT) 4 MG disintegrating tablet Take 1 tablet (4 mg total) by mouth every 8 (eight) hours as needed for nausea or vomiting. 20 tablet 0  . rosuvastatin (CRESTOR) 20 MG tablet Take 1 tablet (20 mg total) by mouth daily. 90 tablet 3  . sertraline (ZOLOFT) 50 MG tablet Take 1 tablet (50 mg total) by mouth daily. 90 tablet 0  . traZODone (DESYREL) 50 MG tablet Take 1 tablet (50 mg total) by mouth at bedtime. 30 tablet 3   No current facility-administered medications on file prior to visit.    ALLERGIES: No Known Allergies  FAMILY HISTORY: Family History  Problem Relation Age of Onset  . Hypertension Mother   . Diabetes Mother   . Heart attack Father   . Emphysema Sister   . Stroke Brother     Objective:  Blood pressure (!) 141/72, pulse 89, height 5\' 4"  (1.626 m), weight 119 lb 6.4 oz (54.2 kg), SpO2 98 %. General: No acute distress.  Patient appears well-groomed.   Head:  Normocephalic/atraumatic Eyes:  fundi examined but not visualized Neck: supple, no paraspinal tenderness, full range of motion Back: No paraspinal tenderness Heart: regular rate and rhythm Lungs: Clear to auscultation bilaterally. Vascular: No carotid bruits. Neurological Exam: Mental status: alert and oriented to person, place, and time, recent and remote memory intact, fund of knowledge intact, attention and concentration intact, speech fluent and not dysarthric, language intact. Cranial nerves: CN I: not tested CN II: pupils equal, round and reactive to light, visual fields intact CN III, IV, VI:  full range of motion, no nystagmus, no ptosis CN V: facial sensation intact. CN VII: upper and lower face symmetric CN VIII: hearing intact CN IX, X: gag intact, uvula midline CN XI: sternocleidomastoid and trapezius muscles intact CN XII: tongue midline Bulk & Tone: normal, no fasciculations. Motor:  muscle strength 5/5 throughout Sensation:  Pinprick, temperature and vibratory  sensation intact. Deep Tendon Reflexes:  2+ throughout,  toes downgoing.   Finger to nose testing:  Mild bilateral intention tremor.  Without dysmetria.   Heel to shin:  Without dysmetria.   Gait:  Normal station and stride.  Romberg negative.    Thank you for allowing me to take part in the care of this patient.  , DO  CC: Shon Millet, NP

## 2021-01-31 ENCOUNTER — Other Ambulatory Visit: Payer: Self-pay

## 2021-01-31 ENCOUNTER — Ambulatory Visit (INDEPENDENT_AMBULATORY_CARE_PROVIDER_SITE_OTHER): Payer: 59 | Admitting: Neurology

## 2021-01-31 ENCOUNTER — Encounter: Payer: Self-pay | Admitting: Neurology

## 2021-01-31 VITALS — BP 141/72 | HR 89 | Ht 64.0 in | Wt 119.4 lb

## 2021-01-31 DIAGNOSIS — E782 Mixed hyperlipidemia: Secondary | ICD-10-CM

## 2021-01-31 DIAGNOSIS — I639 Cerebral infarction, unspecified: Secondary | ICD-10-CM | POA: Diagnosis not present

## 2021-01-31 DIAGNOSIS — I442 Atrioventricular block, complete: Secondary | ICD-10-CM

## 2021-01-31 DIAGNOSIS — R42 Dizziness and giddiness: Secondary | ICD-10-CM | POA: Diagnosis not present

## 2021-01-31 DIAGNOSIS — R55 Syncope and collapse: Secondary | ICD-10-CM | POA: Diagnosis not present

## 2021-01-31 NOTE — Patient Instructions (Addendum)
1.  Check MRI of brain and MRA of head and neck.  2.  Continue current medications 3.  Follow up after testing

## 2021-02-10 NOTE — Progress Notes (Signed)
Remote pacemaker transmission.   

## 2021-02-20 ENCOUNTER — Ambulatory Visit: Payer: 59 | Admitting: Cardiology

## 2021-02-26 ENCOUNTER — Encounter: Payer: Self-pay | Admitting: Cardiology

## 2021-02-26 ENCOUNTER — Other Ambulatory Visit: Payer: Self-pay

## 2021-02-26 ENCOUNTER — Ambulatory Visit (INDEPENDENT_AMBULATORY_CARE_PROVIDER_SITE_OTHER): Payer: 59 | Admitting: Cardiology

## 2021-02-26 VITALS — BP 124/86 | HR 78 | Ht 63.0 in | Wt 117.4 lb

## 2021-02-26 DIAGNOSIS — I6529 Occlusion and stenosis of unspecified carotid artery: Secondary | ICD-10-CM | POA: Insufficient documentation

## 2021-02-26 DIAGNOSIS — E782 Mixed hyperlipidemia: Secondary | ICD-10-CM

## 2021-02-26 DIAGNOSIS — Z950 Presence of cardiac pacemaker: Secondary | ICD-10-CM | POA: Diagnosis not present

## 2021-02-26 DIAGNOSIS — I442 Atrioventricular block, complete: Secondary | ICD-10-CM

## 2021-02-26 DIAGNOSIS — I6523 Occlusion and stenosis of bilateral carotid arteries: Secondary | ICD-10-CM

## 2021-02-26 HISTORY — DX: Occlusion and stenosis of unspecified carotid artery: I65.29

## 2021-02-26 NOTE — Patient Instructions (Signed)
Medication Instructions:  No medication changes. *If you need a refill on your cardiac medications before your next appointment, please call your pharmacy*   Lab Work: Your physician recommends that you return for lab work in: before the next visit. You need to have labs done when you are fasting.  You can come Monday through Friday 8:30 am to 12:00 pm and 1:15 to 4:30. You do not need to make an appointment as the order has already been placed. The labs you are going to have done are BMET, LFT and Lipids.  If you have labs (blood work) drawn today and your tests are completely normal, you will receive your results only by: MyChart Message (if you have MyChart) OR A paper copy in the mail If you have any lab test that is abnormal or we need to change your treatment, we will call you to review the results.   Testing/Procedures: None ordered   Follow-Up: At [facility], you and your health needs are our priority.  As part of our continuing mission to provide you with exceptional heart care, we have created designated Provider Care Teams.  These Care Teams include your primary Cardiologist (physician) and Advanced Practice Providers (APPs -  Physician Assistants and Nurse Practitioners) who all work together to provide you with the care you need, when you need it.  We recommend signing up for the patient portal called "MyChart".  Sign up information is provided on this After Visit Summary.  MyChart is used to connect with patients for Virtual Visits (Telemedicine).  Patients are able to view lab/test results, encounter notes, upcoming appointments, etc.  Non-urgent messages can be sent to your provider as well.   To learn more about what you can do with MyChart, go to [email or web address].    Your next appointment:   4 month(s)  The format for your next appointment:   In Person  Provider:   Belva Crome, MD   Other Instructions NA

## 2021-02-26 NOTE — Progress Notes (Signed)
Cardiology Office Note:    Date:  02/26/2021   ID:  [name], DOB 1961-06-05, MRN [id number]  PCP:  [name], NP  Cardiologist:  Garwin Brothers, MD   Referring MD: [name], NP    ASSESSMENT:    1. Heart block AV complete (HCC)   2. Mixed hyperlipidemia   3. Presence of permanent cardiac pacemaker    PLAN:    In order of problems listed above:  Secondary prevention stressed with the patient.  Importance of compliance with diet medication stressed and he vocalized understanding. Carotid atherosclerosis: I discussed findings with the patient at length.  Lipids were reviewed and I told her to intensify her diet and exercise and she promises to do so. Mixed dyslipidemia: Lipids are not at goal.  Diet and exercise stressed she will be back in 4 months for follow-up appointment before which she will have lipid check. Complete heart block post permanent pacemaker.  Medical management.  Followed by electrophysiology colleagues. Ex-smoker: Promises never to go back to smoking. Patient will be seen in follow-up appointment in 6 months or earlier if the patient has any concerns    Medication Adjustments/Labs and Tests Ordered: Current medicines are reviewed at length with the patient today.  Concerns regarding medicines are outlined above.  Orders Placed This Encounter  Procedures   Hepatic function panel   Basic metabolic panel   Lipid panel   No orders of the defined types were placed in this encounter.    No chief complaint on file.    History of Present Illness:    Leslie Franklin is a 60 y.o. female patient has past medical history of atherosclerosis of the carotid arteries, mixed dyslipidemia and complete heart block for which patient has permanent pacemaker.  She denies any problems at this time and takes care of activities of daily living.  No chest pain orthopnea or PND.  At the time of my evaluation, the patient is alert awake oriented and in no  distress.  Past Medical History:  Diagnosis Date   Anxiety 09/13/2020   Cardiac murmur 09/18/2020   Chest pain of uncertain etiology 09/18/2020   Claudication (HCC) 09/12/2020   COPD (chronic obstructive pulmonary disease) (HCC)    Depression    Ex-smoker 09/18/2020   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   Hyperlipidemia    Palpitations 09/18/2020   Presence of permanent cardiac pacemaker 11/20/2020   Screening mammogram for breast cancer 09/12/2020   Syncope 09/12/2020    Past Surgical History:  Procedure Laterality Date   NO PAST SURGERIES     PACEMAKER IMPLANT N/A 10/21/2020   Procedure: PACEMAKER IMPLANT;  Surgeon: Marinus Maw, MD;  Location: [facility] INVASIVE CV LAB;  Service: Cardiovascular;  Laterality: N/A;    Current Medications: Current Meds  Medication Sig   albuterol (VENTOLIN HFA) 108 (90 Base) MCG/ACT inhaler Inhale 2 puffs into the lungs every 6 (six) hours as needed for wheezing or shortness of breath.   aspirin 81 MG chewable tablet Chew 81 mg by mouth daily.   hydrOXYzine (ATARAX/VISTARIL) 25 MG tablet Take 1 tablet (25 mg total) by mouth 3 (three) times daily as needed for anxiety.   rosuvastatin (CRESTOR) 20 MG tablet Take 1 tablet (20 mg total) by mouth daily.   sertraline (ZOLOFT) 50 MG tablet Take 1 tablet (50 mg total) by mouth daily.   traZODone (DESYREL) 50 MG tablet Take 1 tablet (50 mg total) by mouth at bedtime.  Allergies:   Patient has no known allergies.   Social History   Socioeconomic History   Marital status: Single    Spouse name: Not on file   Number of children: 4   Years of education: Not on file   Highest education level: Not on file  Occupational History   Not on file  Tobacco Use   Smoking status: Former    Packs/day: 1.00    Years: 41.00    Pack years: 41.00    Types: Cigarettes   Smokeless tobacco: Never  Vaping Use   Vaping Use: Never used  Substance and Sexual Activity   Alcohol use: Never   Drug use: Never   Sexual  activity: Not on file  Other Topics Concern   Not on file  Social History Narrative   Not on file   Social Determinants of Health   Financial Resource Strain: Not on file  Food Insecurity: Not on file  Transportation Needs: Not on file  Physical Activity: Not on file  Stress: Not on file  Social Connections: Not on file     Family History: The patient's family history includes Diabetes in her mother; Emphysema in her sister; Heart attack in her father; Hypertension in her mother; Stroke in her brother and father.  ROS:   Please see the history of present illness.    All other systems reviewed and are negative.  EKGs/Labs/Other Studies Reviewed:    The following studies were reviewed today: I discussed my findings with the patient at length.   Recent Labs: 09/13/2020: TSH 1.220 12/25/2020: ALT 20; BUN 14; Creatinine, Ser 1.02; Hemoglobin 15.8; Platelets 260; Potassium 4.4; Sodium 142  Recent Lipid Panel    Component Value Date/Time   CHOL 186 12/25/2020 0956   TRIG 96 12/25/2020 0956   HDL 59 12/25/2020 0956   CHOLHDL 3.2 12/25/2020 0956   LDLCALC 110 (H) 12/25/2020 0956    Physical Exam:    VS:  BP 124/86   Pulse 78   Ht 5\' 3"  (1.6 m)   Wt 117 lb 6.4 oz (53.3 kg)   SpO2 96%   BMI 20.80 kg/m     Wt Readings from Last 3 Encounters:  02/26/21 117 lb 6.4 oz (53.3 kg)  01/31/21 119 lb 6.4 oz (54.2 kg)  01/21/21 119 lb (54 kg)     GEN: Patient is in no acute distress HEENT: Normal NECK: No JVD; No carotid bruits LYMPHATICS: No lymphadenopathy CARDIAC: Hear sounds regular, 2/6 systolic murmur at the apex. RESPIRATORY:  Clear to auscultation without rales, wheezing or rhonchi  ABDOMEN: Soft, non-tender, non-distended MUSCULOSKELETAL:  No edema; No deformity  SKIN: Warm and dry NEUROLOGIC:  Alert and oriented x 3 PSYCHIATRIC:  Normal affect   Signed, 01/23/21, MD  02/26/2021 8:48 AM    [facility]

## 2021-03-06 ENCOUNTER — Ambulatory Visit (HOSPITAL_COMMUNITY)
Admission: RE | Admit: 2021-03-06 | Discharge: 2021-03-06 | Disposition: A | Discharge status: Home / Self Care | Payer: 59 | Source: Ambulatory Visit | Attending: Neurology | Admitting: Neurology

## 2021-03-06 ENCOUNTER — Other Ambulatory Visit: Payer: Self-pay

## 2021-03-06 DIAGNOSIS — I639 Cerebral infarction, unspecified: Secondary | ICD-10-CM

## 2021-03-06 MED ORDER — GADOBUTROL 1 MMOL/ML IV SOLN
5.0000 mL | Freq: Once | INTRAVENOUS | Status: AC | PRN
  Administered 2021-03-06: 5 mL via INTRAVENOUS

## 2021-03-06 NOTE — Progress Notes (Signed)
Patient here today at [facility] for MRI brain wo contrast, followed by MRA head and neck w wo contrast. Patient has a biotronik device. Adam-biotronik rep here at the bedside to program device. Adam received verbal orders from Renee-Cardiology PA for DOO 110. No re-programming to be performed due to technology built into device itself. This RN will continue to monitor patient during scan.

## 2021-03-07 ENCOUNTER — Telehealth: Payer: Self-pay | Admitting: Neurology

## 2021-03-07 DIAGNOSIS — I671 Cerebral aneurysm, nonruptured: Secondary | ICD-10-CM

## 2021-03-07 NOTE — Telephone Encounter (Signed)
Nedra Hai from [facility] radiology would like a call back regarding an MRI for [place]. Pt called this am about this as well.

## 2021-03-07 NOTE — Telephone Encounter (Signed)
Pt was returning a call. [phone]

## 2021-03-07 NOTE — Telephone Encounter (Signed)
Referral added

## 2021-03-10 ENCOUNTER — Other Ambulatory Visit (HOSPITAL_COMMUNITY): Payer: Self-pay | Admitting: Interventional Radiology

## 2021-03-10 DIAGNOSIS — I671 Cerebral aneurysm, nonruptured: Secondary | ICD-10-CM

## 2021-03-11 ENCOUNTER — Ambulatory Visit (HOSPITAL_COMMUNITY)
Admission: RE | Admit: 2021-03-11 | Discharge: 2021-03-11 | Disposition: A | Discharge status: Home / Self Care | Payer: 59 | Source: Ambulatory Visit | Attending: Interventional Radiology | Admitting: Interventional Radiology

## 2021-03-11 ENCOUNTER — Other Ambulatory Visit: Payer: Self-pay

## 2021-03-11 DIAGNOSIS — I671 Cerebral aneurysm, nonruptured: Secondary | ICD-10-CM

## 2021-03-12 HISTORY — PX: IR RADIOLOGIST EVAL & MGMT: IMG5224

## 2021-03-27 ENCOUNTER — Other Ambulatory Visit: Payer: Self-pay | Admitting: Nurse Practitioner

## 2021-03-27 ENCOUNTER — Encounter: Payer: Self-pay | Admitting: Nurse Practitioner

## 2021-03-27 ENCOUNTER — Ambulatory Visit (INDEPENDENT_AMBULATORY_CARE_PROVIDER_SITE_OTHER): Payer: 59 | Admitting: Nurse Practitioner

## 2021-03-27 ENCOUNTER — Other Ambulatory Visit: Payer: Self-pay

## 2021-03-27 VITALS — BP 124/66 | HR 81 | Temp 97.2°F | Ht 64.0 in | Wt 115.0 lb

## 2021-03-27 DIAGNOSIS — J449 Chronic obstructive pulmonary disease, unspecified: Secondary | ICD-10-CM

## 2021-03-27 DIAGNOSIS — F321 Major depressive disorder, single episode, moderate: Secondary | ICD-10-CM | POA: Diagnosis not present

## 2021-03-27 DIAGNOSIS — F17211 Nicotine dependence, cigarettes, in remission: Secondary | ICD-10-CM

## 2021-03-27 DIAGNOSIS — F419 Anxiety disorder, unspecified: Secondary | ICD-10-CM

## 2021-03-27 DIAGNOSIS — E782 Mixed hyperlipidemia: Secondary | ICD-10-CM

## 2021-03-27 DIAGNOSIS — R0602 Shortness of breath: Secondary | ICD-10-CM

## 2021-03-27 MED ORDER — SERTRALINE HCL 50 MG PO TABS: 50.0000 mg | ORAL_TABLET | Freq: Every day | ORAL | 3 refills | Status: DC

## 2021-03-27 MED ORDER — BREZTRI AEROSPHERE 160-9-4.8 MCG/ACT IN AERO: 2.0000 | INHALATION_SPRAY | Freq: Two times a day (BID) | RESPIRATORY_TRACT | 0 refills | Status: DC

## 2021-03-27 MED ORDER — SERTRALINE HCL 25 MG PO TABS: 25.0000 mg | ORAL_TABLET | Freq: Every day | ORAL | 3 refills | Status: DC

## 2021-03-27 MED ORDER — ROSUVASTATIN CALCIUM 20 MG PO TABS: 20.0000 mg | ORAL_TABLET | Freq: Every day | ORAL | 1 refills | Status: DC

## 2021-03-27 NOTE — Patient Instructions (Addendum)
Increase Zoloft to 75 mg daily Notify office of any side effects such as confusion, fever, sweating, etc Follow-up with neurology as scheduled Use Breztri inhaler daily for COPD, rinse mouth afterwards to prevent yeast infection in mouth Return in 4 weeks for evaluation of anxiety and depression medication  Chronic Obstructive Pulmonary Disease  Chronic obstructive pulmonary disease (COPD) is a long-term (chronic) lung problem. When you have COPD, it is hard for air to get in and out ofyour lungs. Usually the condition gets worse over time, and your lungs will never return tonormal. There are things you can do to keep yourself as healthy as possible. What are the causes? Smoking. This is the most common cause. Certain genes passed from parent to child (inherited). What increases the risk? Being exposed to secondhand smoke from cigarettes, pipes, or cigars. Being exposed to chemicals and other irritants, such as fumes and dust in the work environment. Having chronic lung conditions or infections. What are the signs or symptoms? Shortness of breath, especially during physical activity. A long-term cough with a large amount of thick mucus. Sometimes, the cough may not have any mucus (dry cough). Wheezing. Breathing quickly. Skin that looks gray or blue, especially in the fingers, toes, or lips. Feeling tired (fatigue). Weight loss. Chest tightness. Having infections often. Episodes when breathing symptoms become much worse (exacerbations). At the later stages of this disease, you may have swelling in the ankles, feet,or legs. How is this treated? Taking medicines. Quitting smoking, if you smoke. Rehabilitation. This includes steps to make your body work better. It may involve a team of specialists. Doing exercises. Making changes to your diet. Using oxygen. Lung surgery. Lung transplant. Comfort measures (palliative care). Follow these instructions at home: Medicines Take  over-the-counter and prescription medicines only as told by your doctor. Talk to your doctor before taking any cough or allergy medicines. You may need to avoid medicines that cause your lungs to be dry. Lifestyle If you smoke, stop smoking. Smoking makes the problem worse. Do not smoke or use any products that contain nicotine or tobacco. If you need help quitting, ask your doctor. Avoid being around things that make your breathing worse. This may include smoke, chemicals, and fumes. Stay active, but remember to rest as well. Learn and use tips on how to manage stress and control your breathing. Make sure you get enough sleep. Most adults need at least 7 hours of sleep every night. Eat healthy foods. Eat smaller meals more often. Rest before meals. Controlled breathing Learn and use tips on how to control your breathing as told by your doctor. Try: Breathing in (inhaling) through your nose for 1 second. Then, pucker your lips and breath out (exhale) through your lips for 2 seconds. Putting one hand on your belly (abdomen). Breathe in slowly through your nose for 1 second. Your hand on your belly should move out. Pucker your lips and breathe out slowly through your lips. Your hand on your belly should move in as you breathe out.  Controlled coughing Learn and use controlled coughing to clear mucus from your lungs. Follow these steps: Lean your head a little forward. Breathe in deeply. Try to hold your breath for 3 seconds. Keep your mouth slightly open while coughing 2 times. Spit any mucus out into a tissue. Rest and do the steps again 1 or 2 times as needed. General instructions Make sure you get all the shots (vaccines) that your doctor recommends. Ask your doctor about a flu shot  and a pneumonia shot. Use oxygen therapy and pulmonary rehabilitation if told by your doctor. If you need home oxygen therapy, ask your doctor if you should buy a tool to measure your oxygen level  (oximeter). Make a COPD action plan with your doctor. This helps you to know what to do if you feel worse than usual. Manage any other conditions you have as told by your doctor. Avoid going outside when it is very hot, cold, or humid. Avoid people who have a sickness you can catch (contagious). Keep all follow-up visits. Contact a doctor if: You cough up more mucus than usual. There is a change in the color or thickness of the mucus. It is harder to breathe than usual. Your breathing is faster than usual. You have trouble sleeping. You need to use your medicines more often than usual. You have trouble doing your normal activities such as getting dressed or walking around the house. Get help right away if: You have shortness of breath while resting. You have shortness of breath that stops you from: Being able to talk. Doing normal activities. Your chest hurts for longer than 5 minutes. Your skin color is more blue than usual. Your pulse oximeter shows that you have low oxygen for longer than 5 minutes. You have a fever. You feel too tired to breathe normally. These symptoms may represent a serious problem that is an emergency. Do not wait to see if the symptoms will go away. Get medical help right away. Call your local emergency services (911 in the [place] Do not drive yourself to the hospital. Summary Chronic obstructive pulmonary disease (COPD) is a long-term lung problem. The way your lungs work will never return to normal. Usually the condition gets worse over time. There are things you can do to keep yourself as healthy as possible. Take over-the-counter and prescription medicines only as told by your doctor. If you smoke, stop. Smoking makes the problem worse. This information is not intended to replace advice given to you by your health care provider. Make sure you discuss any questions you have with your healthcare provider. Document Revised: 06/25/2020 Document Reviewed:  06/25/2020 Elsevier Patient Education  2022 [employer]   Serotonin Syndrome Serotonin is a chemical in your body (neurotransmitter) that helps to control several functions, such as: Brain and nerve cell function. Mood and emotions. Memory. Eating. Sleeping. Sexual activity. Stress response. Having too much serotonin in your body can cause serotonin syndrome. This condition can be harmful to your brain and nerve cells. This can be alife-threatening condition. What are the causes? This condition may be caused by taking medicines or drugs that increase the level of serotonin in your body, such as: Antidepressant medicines. Migraine medicines. Certain pain medicines. Certain drugs, including ecstasy, LSD, cocaine, and amphetamines. Over-the-counter cough or cold medicines that contain dextromethorphan. Certain herbal supplements, including St. John's wort, ginseng, and nutmeg. This condition usually occurs when you take these medicines or drugs in combination, but it can also happen with a high dose of a single medicine ordrug. What increases the risk? You are more likely to develop this condition if: You just started taking a medicine or drug that increases the level of serotonin in the body. You recently increased the dose of a medicine or drug that increases the level of serotonin in the body. You take more than one medicine or drug that increases the level of serotonin in the body. What are the signs or symptoms? Symptoms of this condition usually  start within several hours of taking a medicine or drug. Symptoms may be mild or severe. Mild symptoms include: Sweating. Restlessness or agitation. Muscle twitching or stiffness. Rapid heart rate. Nausea and vomiting. Diarrhea. Headache. Shivering or goose bumps. Confusion. Severe symptoms include: Irregular heartbeat. Seizures. Loss of consciousness. High fever. How is this diagnosed? This condition may be diagnosed based  on: Your medical history.  A physical exam. Your prior use of drugs and medicines. Blood or urine tests. These may be used to rule out other causes of your symptoms. How is this treated? The treatment for this condition depends on the severity of your symptoms. For mild cases, stopping the medicine or drug that caused your condition is usually all that is needed. For moderate to severe cases, treatment in a hospital may be needed to prevent or manage life-threatening symptoms. This may include medicines to control your symptoms, IV fluids, interventions to support your breathing, and treatments to control your body temperature. Follow these instructions at home: Medicines  Take over-the-counter and prescription medicines only as told by your health care provider. This is important. Check with your health care provider before you start taking any new prescriptions, over-the-counter medicines, herbs, or supplements. Avoid combining any medicines that can cause this condition to occur.  Lifestyle  Maintain a healthy lifestyle. Eat a healthy diet that includes plenty of vegetables, fruits, whole grains, low-fat dairy products, and lean protein. Do not eat a lot of foods that are high in fat, added sugars, or salt. Get the right amount and quality of sleep. Most adults need 7-9 hours of sleep each night. Make time to exercise, even if it is only for short periods of time. Most adults should exercise for at least 150 minutes each week. Do not drink alcohol. Do not use illegal drugs, and do not take medicines for reasons other than they are prescribed.  General instructions Do not use any products that contain nicotine or tobacco, such as cigarettes and e-cigarettes. If you need help quitting, ask your health care provider. Keep all follow-up visits as told by your health care provider. This is important. Contact a health care provider if: Your symptoms do not improve or they get worse. Get  help right away if you: Have worsening confusion, severe headache, chest pain, high fever, seizures, or loss of consciousness. Experience serious side effects of medicine, such as swelling of your face, lips, tongue, or throat. Have serious thoughts about hurting yourself or others. These symptoms may represent a serious problem that is an emergency. Do not wait to see if the symptoms will go away. Get medical help right away. Call your local emergency services (911 in the [place] Do not drive yourself to the hospital. If you ever feel like you may hurt yourself or others, or have thoughts about taking your own life, get help right away. You can go to your nearest emergency department or call: Your local emergency services (911 in the [place] A suicide crisis helpline, such as the National Suicide Prevention Lifeline at (947)251-78381-417-755-4759. This is open 24 hours a day. Summary Serotonin is a brain chemical that helps to regulate the nervous system. High levels of serotonin in the body can cause serotonin syndrome, which is a very dangerous condition. This condition may be caused by taking medicines or drugs that increase the level of serotonin in your body. Treatment depends on the severity of your symptoms. For mild cases, stopping the medicine or drug that caused your condition is usually  all that is needed. Check with your health care provider before you start taking any new prescriptions, over-the-counter medicines, herbs, or supplements. This information is not intended to replace advice given to you by your health care provider. Make sure you discuss any questions you have with your healthcare provider. Document Revised: 09/24/2017 Document Reviewed: 09/24/2017 Elsevier Patient Education  2022 [employer] Inc.  Managing Depression, Adult Depression is a mental health condition that affects your thoughts, feelings, and actions. Being diagnosed with depression can bring you relief if you did not know  why you have felt or behaved a certain way. It could also leave you feeling overwhelmed with uncertainty about your future. Preparing yourself tomanage your symptoms can help you feel more positive about your future. How to manage lifestyle changes Managing stress  Stress is your body's reaction to life changes and events, both good and bad. Stress can add to your feelings of depression. Learning to manage your stresscan help lessen your feelings of depression. Try some of the following approaches to reducing your stress (stress reduction techniques): Listen to music that you enjoy and that inspires you. Try using a meditation app or take a meditation class. Develop a practice that helps you connect with your spiritual self. Walk in nature, pray, or go to a place of worship. Do some deep breathing. To do this, inhale slowly through your nose. Pause at the top of your inhale for a few seconds and then exhale slowly, letting your muscles relax. Practice yoga to help relax and work your muscles. Choose a stress reduction technique that suits your lifestyle and personality. These techniques take time and practice to develop. Set aside 5-15 minutes a day to do them. Therapists can offer training in these techniques. Other things you can do to manage stress include: Keeping a stress diary. Knowing your limits and saying no when you think something is too much. Paying attention to how you react to certain situations. You may not be able to control everything, but you can change your reaction. Adding humor to your life by watching funny films or TV shows. Making time for activities that you enjoy and that relax you.  Medicines Medicines, such as antidepressants, are often a part of treatment for depression. Talk with your pharmacist or health care provider about all the medicines, supplements, and herbal products that you take, their possible side effects, and what medicines and other products are safe  to take together. Make sure to report any side effects you may have to your health care provider. Relationships Your health care provider may suggest family therapy, couples therapy, orindividual therapy as part of your treatment. How to recognize changes Everyone responds differently to treatment for depression. As you recover from depression, you may start to: Have more interest in doing activities. Feel less hopeless. Have more energy. Overeat less often, or have a better appetite. Have better mental focus. It is important to recognize if your depression is not getting better or is getting worse. The symptoms you had in the beginning may return, such as: Tiredness (fatigue) or low energy. Eating too much or too little. Sleeping too much or too little. Feeling restless, agitated, or hopeless. Trouble focusing or making decisions. Unexplained physical complaints. Feeling irritable, angry, or aggressive. If you or your family members notice these symptoms coming back, let yourhealth care provider know right away. Follow these instructions at home: Activity  Try to get some form of exercise each day, such as walking, biking, swimming, or  lifting weights. Practice stress reduction techniques. Engage your mind by taking a class or doing some volunteer work.  Lifestyle Get the right amount and quality of sleep. Cut down on using caffeine, tobacco, alcohol, and other potentially harmful substances. Eat a healthy diet that includes plenty of vegetables, fruits, whole grains, low-fat dairy products, and lean protein. Do not eat a lot of foods that are high in solid fats, added sugars, or salt (sodium). General instructions Take over-the-counter and prescription medicines only as told by your health care provider. Keep all follow-up visits as told by your health care provider. This is important. Where to find support Talking to others  Friends and family members can be sources of support  and guidance. Talk to trusted friends or family members about your condition. Explain your symptoms to them, and let them know that you are working with a health care provider to treat your depression. Tell friends and family members how they also can behelpful. Finances Find appropriate mental health providers that fit with your financial situation. Talk with your health care provider about options to get reduced prices on your medicines. Where to find more information You can find support in your area from: Anxiety and Depression Association of [place] (ADAA): www.adaa.org Mental Health America: www.mentalhealthamerica.net [employer] on Mental Illness: www.nami.org Contact a health care provider if: You stop taking your antidepressant medicines, and you have any of these symptoms: Nausea. Headache. Light-headedness. Chills and body aches. Not being able to sleep (insomnia). You or your friends and family think your depression is getting worse. Get help right away if: You have thoughts of hurting yourself or others. If you ever feel like you may hurt yourself or others, or have thoughts about taking your own life, get help right away. Go to your nearest emergency department or: Call your local emergency services (911 in the [place] Call a suicide crisis helpline, such as the National Suicide Prevention Lifeline at (442)753-2819. This is open 24 hours a day in the [place] Text the Crisis Text Line at (660) 182-8267 (in the [place] Summary If you are diagnosed with depression, preparing yourself to manage your symptoms is a good way to feel positive about your future. Work with your health care provider on a management plan that includes stress reduction techniques, medicines (if applicable), therapy, and healthy lifestyle habits. Keep talking with your health care provider about how your treatment is working. If you have thoughts about taking your own life, call a suicide crisis helpline or  text a crisis text line. This information is not intended to replace advice given to you by your health care provider. Make sure you discuss any questions you have with your healthcare provider. Document Revised: 06/28/2019 Document Reviewed: 06/28/2019 Elsevier Patient Education  2022 Else[employer] Inc. Managing Anxiety, Adult After being diagnosed with an anxiety disorder, you may be relieved to know why you have felt or behaved a certain way. You may also feel overwhelmed about the treatment ahead and what it will mean for your life. With care and support, youcan manage this condition and recover from it. How to manage lifestyle changes Managing stress and anxiety  Stress is your body's reaction to life changes and events, both good and bad. Most stress will last just a few hours, but stress can be ongoing and can lead to more than just stress. Although stress can play a major role in anxiety, it is not the same as anxiety. Stress is usually caused by something external, such as  a deadline, test, or competition. Stress normally passes after thetriggering event has ended.  Anxiety is caused by something internal, such as imagining a terrible outcome or worrying that something will go wrong that will devastate you. Anxiety often does not go away even after the triggering event is over, and it can become long-term (chronic) worry. It is important to understand the differences between stress and anxiety and to manage your stress effectively so that it does not lead to ananxious response. Talk with your health care provider or a counselor to learn more about reducing anxiety and stress. He or she may suggest tension reduction techniques, such as: Music therapy. This can include creating or listening to music that you enjoy and that inspires you. Mindfulness-based meditation. This involves being aware of your normal breaths while not trying to control your breathing. It can be done while sitting or  walking. Centering prayer. This involves focusing on a word, phrase, or sacred image that means something to you and brings you peace. Deep breathing. To do this, expand your stomach and inhale slowly through your nose. Hold your breath for 3-5 seconds. Then exhale slowly, letting your stomach muscles relax. Self-talk. This involves identifying thought patterns that lead to anxiety reactions and changing those patterns. Muscle relaxation. This involves tensing muscles and then relaxing them. Choose a tension reduction technique that suits your lifestyle and personality. These techniques take time and practice. Set aside 5-15 minutes a day to do them. Therapists can offer counseling and training in these techniques. The training to help with anxiety may be covered by some insurance plans. Other things you can do to manage stress and anxiety include: Keeping a stress/anxiety diary. This can help you learn what triggers your reaction and then learn ways to manage your response. Thinking about how you react to certain situations. You may not be able to control everything, but you can control your response. Making time for activities that help you relax and not feeling guilty about spending your time in this way. Visual imagery and yoga can help you stay calm and relax.  Medicines Medicines can help ease symptoms. Medicines for anxiety include: Anti-anxiety drugs. Antidepressants. Medicines are often used as a primary treatment for anxiety disorder. Medicines will be prescribed by a health care provider. When used together, medicines, psychotherapy, and tension reduction techniques may be the most effectivetreatment. Relationships Relationships can play a big part in helping you recover. Try to spend more time connecting with trusted friends and family members. Consider going to couples counseling, taking family education classes, or going to familytherapy. Therapy can help you and others better  understand your condition. How to recognize changes in your anxiety Everyone responds differently to treatment for anxiety. Recovery from anxiety happens when symptoms decrease and stop interfering with your daily activities at home or work. This may mean that you will start to: Have better concentration and focus. Worry will interfere less in your daily thinking. Sleep better. Be less irritable. Have more energy. Have improved memory. It is important to recognize when your condition is getting worse. Contact your health care provider if your symptoms interfere with home or work and you feellike your condition is not improving. Follow these instructions at home: Activity Exercise. Most adults should do the following: Exercise for at least 150 minutes each week. The exercise should increase your heart rate and make you sweat (moderate-intensity exercise). Strengthening exercises at least twice a week. Get the right amount and quality of sleep. Most  adults need 7-9 hours of sleep each night. Lifestyle  Eat a healthy diet that includes plenty of vegetables, fruits, whole grains, low-fat dairy products, and lean protein. Do not eat a lot of foods that are high in solid fats, added sugars, or salt. Make choices that simplify your life. Do not use any products that contain nicotine or tobacco, such as cigarettes, e-cigarettes, and chewing tobacco. If you need help quitting, ask your health care provider. Avoid caffeine, alcohol, and certain over-the-counter cold medicines. These may make you feel worse. Ask your pharmacist which medicines to avoid.  General instructions Take over-the-counter and prescription medicines only as told by your health care provider. Keep all follow-up visits as told by your health care provider. This is important. Where to find support You can get help and support from these sources: Self-help groups. Online and [employer]. A trusted spiritual  leader. Couples counseling. Family education classes. Family therapy. Where to find more information You may find that joining a support group helps you deal with your anxiety. The following sources can help you locate counselors or support groups near you: Mental Health America: www.mentalhealthamerica.net Anxiety and Depression Association of [place] (ADAA): [employer] on Mental Illness (NAMI): www.nami.org Contact a health care provider if you: Have a hard time staying focused or finishing daily tasks. Spend many hours a day feeling worried about everyday life. Become exhausted by worry. Start to have headaches, feel tense, or have nausea. Urinate more than normal. Have diarrhea. Get help right away if you have: A racing heart and shortness of breath. Thoughts of hurting yourself or others. If you ever feel like you may hurt yourself or others, or have thoughts about taking your own life, get help right away. You can go to your nearest emergency department or call: Your local emergency services (911 in the [place] A suicide crisis helpline, such as the National Suicide Prevention Lifeline at (316)885-8993. This is open 24 hours a day. Summary Taking steps to learn and use tension reduction techniques can help calm you and help prevent triggering an anxiety reaction. When used together, medicines, psychotherapy, and tension reduction techniques may be the most effective treatment. Family, friends, and partners can play a big part in helping you recover from an anxiety disorder. This information is not intended to replace advice given to you by your health care provider. Make sure you discuss any questions you have with your healthcare provider. Document Revised: 01/17/2019 Document Reviewed: 01/17/2019 Elsevier Patient Education  2022 [employer].

## 2021-03-27 NOTE — Progress Notes (Signed)
Established Patient Office Visit  Subjective:  Patient ID: Leslie Franklin, female    DOB: [birth year]  Age: 60 y.o. MRN: [id number]  CC: Hyperlipidemia follow-up  HPI Leslie Franklin presents for hyperlipidemia, COPD, anxiety, and depression.She is accompanied by her daughter. She Leslie Franklin tells me that she was recently diagnosed with two brain aneurysms. She has been referred to an interventional radiologist Dr Estanislado Pandy. Amee's daughter's breast cancer has metastasized to her liver that has caused increased stress and worsened anxiety symptoms.  Lipid/Cholesterol, Follow-up  Last lipid panel Other pertinent labs  Lab Results  Component Value Date   CHOL 186 12/25/2020   HDL 59 12/25/2020   LDLCALC 110 (H) 12/25/2020   TRIG 96 12/25/2020   CHOLHDL 3.2 12/25/2020   Lab Results  Component Value Date   ALT 20 12/25/2020   AST 27 12/25/2020   PLT 260 12/25/2020   TSH 1.220 09/13/2020     She was last seen for this 3 months ago.  Management since that visit includes Crestor 20 mg.  She reports good compliance with treatment. She is not having side effects.   Symptoms: No chest pain No chest pressure/discomfort  No dyspnea No lower extremity edema  No numbness or tingling of extremity No orthopnea  No palpitations No paroxysmal nocturnal dyspnea  No speech difficulty No syncope   Current diet: in general, a "healthy" diet   Current exercise: housecleaning  COPD, Follow up  She was last seen for this 3 months ago. Changes made include none, smoking cessation continued   She reports good compliance with treatment. She is not having side effects.  she uses rescue inhaler 0 per days. She IS experiencing dyspnea. She is NOT experiencing fever. she reports breathing is Unchanged.    Depression, Follow-up  She  was last seen for this 3 months ago. Changes made at last visit include Zoloft 50 mg.   She reports excellent compliance with treatment. She is not having side  effects.   She reports good tolerance of treatment. Current symptoms include: depressed mood She feels she is Worse since last visit.   Depression screen [facility] 2/9 03/27/2021 10/10/2020 09/20/2020  Decreased Interest 0 1 3  Down, Depressed, Hopeless 3 - 2  PHQ - 2 Score _0 Altered sleeping _1 Tired, decreased energy _2 Change in appetite _3 Feeling bad or failure about yourself  1 1 0  Trouble concentrating _4 Moving slowly or fidgety/restless 2 1 0  Suicidal thoughts 0 0 0  PHQ-9 Score _5 Difficult doing work/chores Very difficult Very difficult -       Anxiety, Follow-up  She was last seen for anxiety 3 months ago. Changes made at last visit include Hydroxyzine 25 mg TID PRN.   She reports good compliance with treatment. She reports good tolerance of treatment. She is not having side effects.   She feels her anxiety is moderate and Worse since last visit.  Symptoms: No chest pain Yes difficulty concentrating  Yes dizziness No fatigue  No feelings of losing control No insomnia  No irritable No palpitations  No panic attacks Yes racing thoughts  Yes shortness of breath No sweating  No tremors/shakes      GAD-7 Results GAD-7 Generalized Anxiety Disorder Screening Tool 03/27/2021  1. Feeling Nervous, Anxious, or on Edge 2  2. Not Being Able to Stop or Control Worrying 3  3. Worrying Too  Much About Different Things 3  4. Trouble Relaxing 3  5. Being So Restless it's Hard To Sit Still 0  6. Becoming Easily Annoyed or Irritable 3  7. Feeling Afraid As If Something Awful Might Happen 1  Total GAD-7 Score 15  Difficulty At Work, Home, or Getting  Along With Others? Somewhat difficult    PHQ-9 Scores PHQ9 SCORE ONLY 03/27/2021 10/10/2020 09/20/2020  PHQ-9 Total Score _0 Past Medical History:  Diagnosis Date   Anxiety 09/13/2020   Cardiac murmur 09/18/2020   Chest pain of uncertain etiology 02/25/3150   Claudication ([place]) 09/12/2020    COPD (chronic obstructive pulmonary disease) (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   Hyperlipidemia    Palpitations 09/18/2020   Presence of permanent cardiac pacemaker 11/20/2020   Screening mammogram for breast cancer 09/12/2020   Syncope 09/12/2020    Past Surgical History:  Procedure Laterality Date   IR RADIOLOGIST EVAL & MGMT  03/12/2021   NO PAST SURGERIES     PACEMAKER IMPLANT N/A 10/21/2020   Procedure: PACEMAKER IMPLANT;  Surgeon: Evans Lance, MD;  Location: [facility] [facility];  Service: Cardiovascular;  Laterality: N/A;    Family History  Problem Relation Age of Onset   Hypertension Mother    Diabetes Mother    Heart attack Father    Stroke Father    Emphysema Sister    Stroke Brother     Social History   Socioeconomic History   Marital status: Single    Spouse name: Not on file   Number of children: 4   Years of education: Not on file   Highest education level: Not on file  Occupational History   Not on file  Tobacco Use   Smoking status: Former    Packs/day: 1.00    Years: 41.00    Pack years: 41.00    Types: Cigarettes   Smokeless tobacco: Never  Vaping Use   Vaping Use: Never used  Substance and Sexual Activity   Alcohol use: Never   Drug use: Never   Sexual activity: Not on file  Other Topics Concern   Not on file  Social History Narrative   Not on file   Social Determinants of Health   Financial Resource Strain: Not on file  Food Insecurity: Not on file  Transportation Needs: Not on file  Physical Activity: Not on file  Stress: Not on file  Social Connections: Not on file  Intimate Partner Violence: Not on file    Outpatient Medications Prior to Visit  Medication Sig Dispense Refill   albuterol (VENTOLIN HFA) 108 (90 Base) MCG/ACT inhaler Inhale 2 puffs into the lungs every 6 (six) hours as needed for wheezing or shortness of breath. 8 g 0   aspirin 81 MG chewable tablet Chew 81 mg by  mouth daily.     hydrOXYzine (ATARAX/VISTARIL) 25 MG tablet Take 1 tablet (25 mg total) by mouth 3 (three) times daily as needed for anxiety. 30 tablet 0   rosuvastatin (CRESTOR) 20 MG tablet Take 1 tablet (20 mg total) by mouth daily. 90 tablet 3   sertraline (ZOLOFT) 50 MG tablet Take 1 tablet (50 mg total) by mouth daily. 90 tablet 0   traZODone (DESYREL) 50 MG tablet Take 1 tablet (50 mg total) by mouth at bedtime. 30 tablet 3   No facility-administered medications prior to visit.    No  Known Allergies  ROS Review of Systems  Constitutional:  Positive for fatigue. Negative for chills and fever.  HENT:  Positive for congestion and rhinorrhea. Negative for ear pain and sore throat.   Eyes: Negative.   Respiratory:  Positive for cough and shortness of breath.   Cardiovascular:  Positive for palpitations. Negative for chest pain.  Gastrointestinal:  Negative for abdominal pain, constipation, diarrhea, nausea and vomiting.  Endocrine: Negative.   Genitourinary:  Negative for dysuria and urgency.  Musculoskeletal:  Positive for back pain. Negative for arthralgias and myalgias.  Skin: Negative.   Allergic/Immunologic: Negative.   Neurological:  Positive for dizziness and headaches. Negative for weakness and light-headedness.  Hematological: Negative.   Psychiatric/Behavioral:  Negative for dysphoric mood. The patient is nervous/anxious.      Objective:   BP 124/66   Pulse 81   Temp (!) 97.2 F (36.2 C)   Ht _0  (1.626 m)   Wt 115 lb (52.2 kg)   SpO2 98%   BMI 19.74 kg/m    Physical Exam Vitals reviewed.  Constitutional:      Appearance: Normal appearance.  HENT:     Head: Normocephalic.     Right Ear: Tympanic membrane normal.     Left Ear: Tympanic membrane normal.     Nose: Nose normal.     Mouth/Throat:     Mouth: Mucous membranes are moist.  Cardiovascular:     Rate and Rhythm: Normal rate and regular rhythm.     Pulses: Normal pulses.     Heart sounds: Normal  heart sounds.  Pulmonary:     Effort: Pulmonary effort is normal.     Comments: Diminished lung sounds in all fields Abdominal:     General: Bowel sounds are normal.     Palpations: Abdomen is soft.  Musculoskeletal:        General: Normal range of motion.     Cervical back: Neck supple.  Skin:    General: Skin is warm and dry.     Capillary Refill: Capillary refill takes less than 2 seconds.  Neurological:     General: No focal deficit present.     Mental Status: She is alert and oriented to person, place, and time.  Psychiatric:        Mood and Affect: Mood normal.        Behavior: Behavior normal.    Wt Readings from Last 3 Encounters:  02/26/21 117 lb 6.4 oz (53.3 kg)  01/31/21 119 lb 6.4 oz (54.2 kg)  01/21/21 119 lb (54 kg)     Health Maintenance Due  Topic Date Due   HIV Screening  Never done   Hepatitis C Screening  Never done   TETANUS/TDAP  Never done   COLONOSCOPY (Pts 45-56yr Insurance coverage will need to be confirmed)  Never done   Zoster Vaccines- Shingrix (1 of 2) Never done   COVID-19 Vaccine (3 - Booster for Moderna series) 01/31/2021    Lab Results  Component Value Date   TSH 1.220 09/13/2020   Lab Results  Component Value Date   WBC 8.3 12/25/2020   HGB 15.8 12/25/2020   HCT 46.9 (H) 12/25/2020   MCV 86 12/25/2020   PLT 260 12/25/2020   Lab Results  Component Value Date   NA 142 12/25/2020   K 4.4 12/25/2020   CO2 23 12/25/2020   GLUCOSE 95 12/25/2020   BUN 14 12/25/2020   CREATININE 1.02 (H) 12/25/2020   BILITOT 0.4 12/25/2020  ALKPHOS 118 12/25/2020   AST 27 12/25/2020   ALT 20 12/25/2020   PROT 7.1 12/25/2020   ALBUMIN 4.5 12/25/2020   CALCIUM 9.9 12/25/2020   EGFR 64 12/25/2020   Lab Results  Component Value Date   CHOL 186 12/25/2020   Lab Results  Component Value Date   HDL 59 12/25/2020   Lab Results  Component Value Date   LDLCALC 110 (H) 12/25/2020   Lab Results  Component Value Date   TRIG 96 12/25/2020    Lab Results  Component Value Date   CHOLHDL 3.2 12/25/2020       Assessment & Plan:   1. Mixed hyperlipidemia-well controlled - CBC with Differential/Platelet - Comprehensive metabolic panel - Lipid panel -continue Crestor 20 mg daily  2. Chronic obstructive pulmonary disease, unspecified COPD type ([place]) - CBC with Differential/Platelet - Comprehensive metabolic panel - Budeson-Glycopyrrol-Formoterol (BREZTRI AEROSPHERE) 160-9-4.8 MCG/ACT AERO; Inhale 2 puffs into the lungs 2 (two) times daily.  Dispense: 10.7 g; Refill: 0  3. Depression, major, single episode, moderate (HCC)-not at goal - sertraline (ZOLOFT) 50 MG tablet; Take 1 tablet (50 mg total) by mouth daily.  Dispense: 90 tablet; Refill: 3 - sertraline (ZOLOFT) 25 MG tablet; Take 1 tablet (25 mg total) by mouth daily.  Dispense: 90 tablet; Refill: 3 -PHQ-9 score 18 today in office  4. Anxiety-not at goal - sertraline (ZOLOFT) 50 MG tablet; Take 1 tablet (50 mg total) by mouth daily.  Dispense: 90 tablet; Refill: 3 - sertraline (ZOLOFT) 25 MG tablet; Take 1 tablet (25 mg total) by mouth daily.  Dispense: 90 tablet; Refill: 3 -GAD 7 score 15 today in office  5. Shortness of breath - Budeson-Glycopyrrol-Formoterol (BREZTRI AEROSPHERE) 160-9-4.8 MCG/ACT AERO; Inhale 2 puffs into the lungs 2 (two) times daily.  Dispense: 10.7 g; Refill: 0  6. Cigarette nicotine dependence in remission    Increase Zoloft to 75 mg daily Notify office of any side effects such as confusion, fever, sweating, etc Follow-up with neurology as scheduled Use Breztri inhaler daily for COPD, rinse mouth afterwards to prevent yeast infection in mouth Return in 4 weeks for evaluation of anxiety and depression medication  Follow-up:  4-weeks  Signed, Rip Harbour, NP

## 2021-03-28 ENCOUNTER — Other Ambulatory Visit (HOSPITAL_COMMUNITY): Payer: Self-pay | Admitting: Interventional Radiology

## 2021-03-28 DIAGNOSIS — I671 Cerebral aneurysm, nonruptured: Secondary | ICD-10-CM

## 2021-03-28 LAB — CBC WITH DIFFERENTIAL/PLATELET
Basophils Absolute: 0.1 10*3/uL (ref 0.0–0.2)
Basos: 1 %
EOS (ABSOLUTE): 0.2 10*3/uL (ref 0.0–0.4)
Eos: 2 %
Hematocrit: 46.2 % (ref 34.0–46.6)
Hemoglobin: 15.1 g/dL (ref 11.1–15.9)
Immature Grans (Abs): 0 10*3/uL (ref 0.0–0.1)
Immature Granulocytes: 0 %
Lymphocytes Absolute: 2.9 10*3/uL (ref 0.7–3.1)
Lymphs: 31 %
MCH: 28.3 pg (ref 26.6–33.0)
MCHC: 32.7 g/dL (ref 31.5–35.7)
MCV: 87 fL (ref 79–97)
Monocytes Absolute: 0.6 10*3/uL (ref 0.1–0.9)
Monocytes: 6 %
Neutrophils Absolute: 5.6 10*3/uL (ref 1.4–7.0)
Neutrophils: 60 %
Platelets: 313 10*3/uL (ref 150–450)
RBC: 5.33 x10E6/uL — ABNORMAL HIGH (ref 3.77–5.28)
RDW: 12.8 % (ref 11.7–15.4)
WBC: 9.4 10*3/uL (ref 3.4–10.8)

## 2021-03-28 LAB — LIPID PANEL
Chol/HDL Ratio: 3.1 ratio (ref 0.0–4.4)
Cholesterol, Total: 154 mg/dL (ref 100–199)
HDL: 49 mg/dL (ref >39)
LDL Chol Calc (NIH): 89 mg/dL (ref 0–99)
Triglycerides: 87 mg/dL (ref 0–149)
VLDL Cholesterol Cal: 16 mg/dL (ref 5–40)

## 2021-03-28 LAB — COMPREHENSIVE METABOLIC PANEL
ALT: 10 IU/L (ref 0–32)
AST: 17 IU/L (ref 0–40)
Albumin/Globulin Ratio: 2.1 (ref 1.2–2.2)
Albumin: 4.5 g/dL (ref 3.8–4.9)
Alkaline Phosphatase: 122 IU/L — ABNORMAL HIGH (ref 44–121)
BUN/Creatinine Ratio: 16 (ref 9–23)
BUN: 15 mg/dL (ref 6–24)
Bilirubin Total: <0.2 mg/dL (ref 0.0–1.2)
CO2: 24 mmol/L (ref 20–29)
Calcium: 9.7 mg/dL (ref 8.7–10.2)
Chloride: 104 mmol/L (ref 96–106)
Creatinine, Ser: 0.91 mg/dL (ref 0.57–1.00)
Globulin, Total: 2.1 g/dL (ref 1.5–4.5)
Glucose: 83 mg/dL (ref 65–99)
Potassium: 5.1 mmol/L (ref 3.5–5.2)
Sodium: 144 mmol/L (ref 134–144)
Total Protein: 6.6 g/dL (ref 6.0–8.5)
eGFR: 73 mL/min/{1.73_m2} (ref >59)

## 2021-03-28 LAB — CARDIOVASCULAR RISK ASSESSMENT

## 2021-04-02 ENCOUNTER — Other Ambulatory Visit: Payer: Self-pay | Admitting: Radiology

## 2021-04-02 ENCOUNTER — Other Ambulatory Visit (HOSPITAL_COMMUNITY): Payer: Self-pay | Admitting: Interventional Radiology

## 2021-04-02 ENCOUNTER — Other Ambulatory Visit (HOSPITAL_COMMUNITY): Payer: Self-pay | Admitting: Radiology

## 2021-04-02 DIAGNOSIS — I6529 Occlusion and stenosis of unspecified carotid artery: Secondary | ICD-10-CM

## 2021-04-02 LAB — SARS CORONAVIRUS 2 (TAT 6-24 HRS): SARS Coronavirus 2: NEGATIVE

## 2021-04-04 ENCOUNTER — Encounter (HOSPITAL_COMMUNITY): Payer: Self-pay | Admitting: Interventional Radiology

## 2021-04-04 ENCOUNTER — Other Ambulatory Visit: Payer: Self-pay | Admitting: Student

## 2021-04-04 ENCOUNTER — Other Ambulatory Visit: Payer: Self-pay

## 2021-04-04 ENCOUNTER — Encounter: Payer: Self-pay | Admitting: Internal Medicine

## 2021-04-04 LAB — PLATELET INHIBITION P2Y12: Platelet Function  P2Y12: 5 [PRU] — ABNORMAL LOW (ref 182–335)

## 2021-04-04 NOTE — Progress Notes (Signed)
Sent a message to Dr. Ladona Ridgel for pacemaker device recommendations for surgery. Also called Kipp Brood (rep) with [employer] and notified him of surgery time and date.

## 2021-04-04 NOTE — Progress Notes (Signed)
PCP - Flonnie Hailstone, NP Cardiologist - Dr. Lewayne Bunting and Belva Crome  PPM/ICD - pacemaker ([employer]) Device Orders - messaged dr taylor for orders Rep Notified - yes (brent with [employer])  Chest x-ray - n/a EKG - 01/21/21 Stress Test - 09/24/20 ECHO - 10/14/20 Cardiac Cath - denies  CPAP - denies  No diabetes  Patient instructed to hold all , NSAID's, herbal medications, fish oil and vitamins 7 days prior to surgery.  Please take your aspirin and clopidogrel (Plavix) the morning of surgery.   ERAS Protcol - yes  COVID TEST- DOS 04/07/21  Anesthesia review: yes, pacemaker  Patient verbally denies any shortness of breath, fever, cough and chest pain during phone call   -------------  SDW INSTRUCTIONS given:  Your procedure is scheduled on 04/07/21.  Report to [facility] Main Entrance "A" at 05:30 A.M., and check in at the Admitting office.  Call this number if you have problems the morning of surgery:  [phone]   Remember:  Do not eat after midnight the night before your surgery  You may drink clear liquids until 05:30am the morning of your surgery.   Clear liquids allowed are: Water, Non-Citrus Juices (without pulp), Carbonated Beverages, Clear Tea, Black Coffee Only, and Gatorade    Take these medicines the morning of surgery with A SIP OF WATER  Budeson-Glycopyrrol-Formoterol (BREZTRI AEROSPHERE) rosuvastatin (CRESTOR) sertraline (ZOLOFT) Aspirin Clopidogrel (Plavix)    Patient instructed to hold all , NSAID's, herbal medications, fish oil and vitamins 7 days prior to surgery.  Please follow your surgeons instructions on if and how long to hold aspirin and clopidogrel (Plavix).                      Do not wear jewelry, make up, or nail polish            Do not wear lotions, powders, perfumes/colognes, or deodorant.            Do not shave 48 hours prior to surgery.  Men may shave face and neck.            Do not bring valuables to the hospital.             [facility] is not responsible for any belongings or valuables.  Do NOT Smoke (Tobacco/Vaping) or drink Alcohol 24 hours prior to your procedure If you use a CPAP at night, you may bring all equipment for your overnight stay.   Contacts, glasses, dentures or bridgework may not be worn into surgery.      For patients admitted to the hospital, discharge time will be determined by your treatment team.   Patients discharged the day of surgery will not be allowed to drive home, and someone needs to stay with them for 24 hours.    Special instructions:   [facility]- Preparing For Surgery  Before surgery, you can play an important role. Because skin is not sterile, your skin needs to be as free of germs as possible. You can reduce the number of germs on your skin by washing with CHG (chlorahexidine gluconate) Soap before surgery.  CHG is an antiseptic cleaner which kills germs and bonds with the skin to continue killing germs even after washing.    Oral Hygiene is also important to reduce your risk of infection.  Remember - BRUSH YOUR TEETH THE MORNING OF SURGERY WITH YOUR REGULAR TOOTHPASTE  Please do not use if you have an allergy to CHG or  antibacterial soaps. If your skin becomes reddened/irritated stop using the CHG.  Do not shave (including legs and underarms) for at least 48 hours prior to first CHG shower. It is OK to shave your face.  Please follow these instructions carefully.   Shower the NIGHT BEFORE SURGERY and the MORNING OF SURGERY with DIAL Soap.   Pat yourself dry with a CLEAN TOWEL.  Wear CLEAN PAJAMAS to bed the night before surgery  Place CLEAN SHEETS on your bed the night of your first shower and DO NOT SLEEP WITH PETS.   Day of Surgery: Please shower morning of surgery  Wear Clean/Comfortable clothing the morning of surgery Do not apply any deodorants/lotions.   Remember to brush your teeth WITH YOUR REGULAR TOOTHPASTE.   Questions were answered. Patient  verbalized understanding of instructions.

## 2021-04-04 NOTE — Progress Notes (Signed)
Anesthesia Chart Review: Leslie Franklin  Case: [id number] Date/Time: 04/07/21 0815   Procedure: EMBOLIZATION   Anesthesia type: General   Pre-op diagnosis: BRAIN ANEURYSM   Location: [facility] OR RADIOLOGY ROOM / [place] OR   Surgeons: Luanne Bras, MD       DISCUSSION: Patient is a 60 year old female scheduled for embolization of brain aneurysm. By IR notes, she has a large right paraophthalmic region aneurysm and also suspected outpouching in the left PCA region which may represent an aneurysm. Patient desired endovascular treatment of RICA and possible left PCOM aneurysms at the same time as her diagnostic arteriogram. Plavix was added her ASA regimen 10 days prior to procedure which she will take on the day of surgery.  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   Last visit with EP Dr. Lovena Le 01/21/2021.  She was pacing approximately 30% of the time.  Her [employer] DDD PPM was working normally.  Interrogation did show up to a minute of AT/AF which will be monitored for further episodes.   Last cardiology visit with Dr. Geraldo Pitter 02/26/2021.  21-monthfollow-up plan.  She is for preprocedure COVID-19 testing 04/07/2021.  Anesthesia team to evaluate on day of procedure.  [employer] rep notified of procedure plans.   VS: Ht _0  (1.626 m)   Wt 52.2 kg   BMI 19.75 kg/m  BP Readings from Last 3 Encounters:  03/27/21 124/66  02/26/21 124/86  01/31/21 (!) 141/72   Pulse Readings from Last 3 Encounters:  03/27/21 81  02/26/21 78  01/31/21 89     PROVIDERS: HRip Harbour NP is PCP  ([facility]) Revankar, RSunny Schlein MD is cardiologist TCristopher Peru MD is EP JMetta Clines DO is  neurologist   LABS:  Lab results as of 03/27/21 include: Lab Results  Component Value Date   WBC 9.4 03/27/2021   HGB 15.1 03/27/2021   HCT 46.2 03/27/2021   PLT 313 03/27/2021   GLUCOSE 83 03/27/2021   CHOL 154 03/27/2021   TRIG 87 03/27/2021   HDL 49 03/27/2021   LDLCALC 89 03/27/2021   ALT 10 03/27/2021   AST 17 03/27/2021   NA 144 03/27/2021   K 5.1 03/27/2021   CL 104 03/27/2021   CREATININE 0.91 03/27/2021   BUN 15 03/27/2021   CO2 24 03/27/2021   TSH 1.220 09/13/2020     IMAGES: MRI/MRA Head 03/06/21: IMPRESSION: MRI brain: 1. No evidence of acute intracranial abnormality. 2. Redemonstrated small chronic cortical/subcortical infarct within the right middle frontal gyrus. 3. Mild chronic small vessel ischemic changes within the cerebral white matter. 4. Three chronic lacunar infarcts within the right cerebellar hemisphere. 5. Mild generalized parenchymal atrophy. 6. Left sphenoid sinusitis. 7. Trace fluid within the bilateral mastoid air cells. MRA head: 1. No intracranial large vessel occlusion or proximal high-grade arterial stenosis. 2. 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 Neuro-interventional consultation is recommended. 3. 1-2 mm aneurysm arising from the cavernous left ICA.   MRA Neck 03/06/21: IMPRESSION: - The common carotid and internal carotid arteries are patent within the neck without hemodynamically significant stenosis (50% or greater). Mild atherosclerotic plaque within the right carotid bifurcation/proximal ICA, mid-to-distal left CCA and proximal left ICA. - Vertebral arteries codominant  and patent within the neck. Mild atherosclerotic narrowing of the proximal V1 left vertebral artery.   EKG: 01/21/21 ([facility]): NSR with atrial pacing   CV: EP Procedure 10/21/20: CONCLUSIONS:  1. Successful implantation of a [employer]ik dual-chamber pacemaker for symptomatic bradycardia due to  CHB  2. No early apparent complications.   Echo 10/14/20: IMPRESSIONS   1. Left ventricular ejection fraction, by estimation, is 60 to 65%. The  left ventricle has normal function. The left ventricle has no regional  wall motion abnormalities. Left ventricular diastolic parameters were  normal.   2. Right ventricular systolic function is normal. The right ventricular  size is normal. There is normal pulmonary artery systolic pressure.   3. The mitral valve is normal in structure. No evidence of mitral valve  regurgitation. No evidence of mitral stenosis.   4. The aortic valve is tricuspid. Aortic valve regurgitation is not  visualized. No aortic stenosis is present.   5. The inferior vena cava is normal in size with greater than 50%  respiratory variability, suggesting right atrial pressure of 3 mmHg.    Long term monitor 09/18/20-10/02/20: Study Highlights  - Patient had a min HR of 26 bpm, max HR of 145 bpm, and avg HR of 84 bpm. - Predominant underlying rhythm was Sinus Rhythm. 1 run of Supraventricular Tachycardia occurred lasting 7 beats with a max rate of 102 bpm (avg 98 bpm). 1 Pause occurred lasting 4.6 secs (13 bpm). Pause Occurred due to High Grade AV Block. 3 episode(s) of AV Block (High Grade) occurred, lasting a total of 16 secs. Isolated SVEs were rare (<1.0%), SVE Couplets were rare (<1.0%), and SVE Triplets were rare (<1.0%).  Isolated VEs were rare (<1.0%), and no VE Couplets or VE Triplets were present. Difficulty discerning atrial activity at times making definitive diagnosis difficult to ascertain. - MD notification criteria for High Grade AV Block (4.6 seconds of Ventricular Asystole) met - report posted prior to notification per account request (ahs).   Nuclear stress test 09/24/20: Nuclear stress EF: 64%. There was no ST segment deviation noted during stress. This is a low risk study. The left ventricular ejection fraction is normal (55-65%).   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   Hyperlipidemia    Palpitations 09/18/2020   Peripheral vascular disease ([place])    Presence of permanent cardiac pacemaker 11/20/2020   Screening mammogram for breast cancer 09/12/2020   Stroke ([place])    Syncope 09/12/2020    Past Surgical History:  Procedure Laterality Date   INSERT / REPLACE / REMOVE PACEMAKER     IR RADIOLOGIST EVAL & MGMT  03/12/2021   NO PAST SURGERIES     PACEMAKER IMPLANT N/A 10/21/2020   Procedure: PACEMAKER IMPLANT;  Surgeon: Evans Lance, MD;  Location: [place] [facility];  Service: Cardiovascular;  Laterality: N/A;    MEDICATIONS: No current facility-administered medications for this encounter.    aspirin 81 MG chewable tablet   Budeson-Glycopyrrol-Formoterol (BREZTRI AEROSPHERE) 160-9-4.8 MCG/ACT AERO   clopidogrel (PLAVIX) 75 MG tablet   rosuvastatin (CRESTOR) 20 MG tablet   sertraline (ZOLOFT) 25 MG tablet   sertraline (ZOLOFT) 50 MG tablet   albuterol (VENTOLIN HFA) 108 (90 Base)  MCG/ACT inhaler   hydrOXYzine (ATARAX/VISTARIL) 25 MG tablet   traZODone (DESYREL) 50 MG tablet    Myra Gianotti, PA-C Surgical Short Stay/Anesthesiology [facility] Phone [phone] [facility] Phone [phone] 04/04/2021 1:00 PM

## 2021-04-04 NOTE — Anesthesia Preprocedure Evaluation (Addendum)
Anesthesia Evaluation  Patient identified by MRN, date of birth, ID band Patient awake    Reviewed: Allergy & Precautions, NPO status , Patient's Chart, lab work & pertinent test results  Airway Mallampati: II  TM Distance: >3 FB Neck ROM: Full    Dental  (+) Edentulous Upper, Edentulous Lower   Pulmonary COPD,  COPD inhaler, former smoker,  Former smoker 41 pack year history    Pulmonary exam normal breath sounds clear to auscultation       Cardiovascular + Peripheral Vascular Disease  Normal cardiovascular exam+ dysrhythmias + pacemaker (10/2020 for [facility])  Rhythm:Regular Rate:Normal  Echo 10/2020: 1. Left ventricular ejection fraction, by estimation, is 60 to 65%. The  left ventricle has normal function. The left ventricle has no regional  wall motion abnormalities. Left ventricular diastolic parameters were  normal.  2. Right ventricular systolic function is normal. The right ventricular  size is normal. There is normal pulmonary artery systolic pressure.  3. The mitral valve is normal in structure. No evidence of mitral valve  regurgitation. No evidence of mitral stenosis.  4. The aortic valve is tricuspid. Aortic valve regurgitation is not  visualized. No aortic stenosis is present.  5. The inferior vena cava is normal in size with greater than 50%  respiratory variability, suggesting right atrial pressure of 3 mmHg.   Stress test: ? Nuclear stress EF: 64%. ? There was no ST segment deviation noted during stress. ? This is a low risk study. ? The left ventricular ejection fraction is normal (55-65%).     Neuro/Psych PSYCHIATRIC DISORDERS Anxiety Depression large right paraophthalmic region aneurysm and also suspected outpouching in the left PCA region which may represent an aneurysm. CVA    GI/Hepatic negative GI ROS, Neg liver ROS,   Endo/Other  negative endocrine ROS  Renal/GU negative Renal ROS  negative  genitourinary   Musculoskeletal negative musculoskeletal ROS (+)   Abdominal   Peds  Hematology negative hematology ROS (+)   Anesthesia Other Findings   Reproductive/Obstetrics negative OB ROS                         Anesthesia Physical Anesthesia Plan  ASA: 3  Anesthesia Plan: MAC   Post-op Pain Management:    Induction:   PONV Risk Score and Plan: Treatment may vary due to age or medical condition, Propofol infusion, TIVA and Midazolam  Airway Management Planned: Natural Airway and Simple Face Mask  Additional Equipment: None  Intra-op Plan:   Post-operative Plan:   Informed Consent: I have reviewed the patients History and Physical, chart, labs and discussed the procedure including the risks, benefits and alternatives for the proposed anesthesia with the patient or authorized representative who has indicated his/her understanding and acceptance.     Dental advisory given  Plan Discussed with: CRNA and Anesthesiologist  Anesthesia Plan Comments: (Low p2y12 this morning- per Dr. Estanislado Pandy will likely only do angiogram today. Will proceed w/ sedation and convert to GA/place arterial line if necessary ' Per rep, magnet available for PPM)   Anesthesia Quick Evaluation

## 2021-04-04 NOTE — Progress Notes (Signed)
PERIOPERATIVE PRESCRIPTION FOR IMPLANTED CARDIAC DEVICE PROGRAMMING  Patient Information: Name:  Leslie Franklin  DOB:  1960-11-10  MRN:  [id number]    From: Amalia Greenhouse, RN  Sent: 04/04/2021  10:51 AM EDT  To: [facility]  Subject: perioperative cardiac device programming       Planned Procedure:  embolization -Brain aneurysm embolization  Surgeon:  Julieanne Cotton  Date of Procedure:  04/07/21  Cautery will be used.  Position during surgery:  supine   Please send documentation back to:  [facility] (Fax # [phone])   Amalia Greenhouse, RN  04/04/2021 10:51 AM  Device Information:  Clinic EP Physician:  Lewayne Bunting, MD   Device Type:  Pacemaker Manufacturer and Phone #:  Biotronik: [phone] Pacemaker Dependent?:  No. Date of Last Device Check:  01/21/21 Normal Device Function?:  Yes.    Electrophysiologist's Recommendations:  Have magnet available. Provide continuous ECG monitoring when magnet is used or reprogramming is to be performed.  Procedure may interfere with device function.  Magnet should be placed over device during procedure.  Per Device Clinic Standing Orders, Linton Ham, RN  1:38 PM 04/04/2021

## 2021-04-07 ENCOUNTER — Observation Stay (HOSPITAL_COMMUNITY)
Admission: RE | Admit: 2021-04-07 | Discharge: 2021-04-07 | Disposition: A | Discharge status: Home / Self Care | Payer: 59 | Attending: Interventional Radiology | Admitting: Interventional Radiology

## 2021-04-07 ENCOUNTER — Observation Stay (HOSPITAL_COMMUNITY): Payer: 59 | Admitting: Vascular Surgery

## 2021-04-07 ENCOUNTER — Telehealth (HOSPITAL_COMMUNITY): Payer: Self-pay | Admitting: *Deleted

## 2021-04-07 ENCOUNTER — Encounter (HOSPITAL_COMMUNITY): Payer: Self-pay | Admitting: Interventional Radiology

## 2021-04-07 ENCOUNTER — Other Ambulatory Visit: Payer: Self-pay

## 2021-04-07 ENCOUNTER — Encounter (HOSPITAL_COMMUNITY)
Admission: RE | Disposition: A | Discharge status: Home / Self Care | Payer: Self-pay | Source: Home / Self Care | Attending: Interventional Radiology

## 2021-04-07 ENCOUNTER — Ambulatory Visit (HOSPITAL_COMMUNITY)
Admission: RE | Admit: 2021-04-07 | Discharge: 2021-04-07 | Disposition: A | Discharge status: Home / Self Care | Payer: 59 | Source: Ambulatory Visit | Attending: Interventional Radiology | Admitting: Interventional Radiology

## 2021-04-07 DIAGNOSIS — E785 Hyperlipidemia, unspecified: Secondary | ICD-10-CM | POA: Insufficient documentation

## 2021-04-07 DIAGNOSIS — Z7982 Long term (current) use of aspirin: Secondary | ICD-10-CM | POA: Diagnosis not present

## 2021-04-07 DIAGNOSIS — Z20822 Contact with and (suspected) exposure to covid-19: Secondary | ICD-10-CM | POA: Insufficient documentation

## 2021-04-07 DIAGNOSIS — Z7902 Long term (current) use of antithrombotics/antiplatelets: Secondary | ICD-10-CM | POA: Diagnosis not present

## 2021-04-07 DIAGNOSIS — Z950 Presence of cardiac pacemaker: Secondary | ICD-10-CM | POA: Diagnosis not present

## 2021-04-07 DIAGNOSIS — Z79899 Other long term (current) drug therapy: Secondary | ICD-10-CM | POA: Insufficient documentation

## 2021-04-07 DIAGNOSIS — I671 Cerebral aneurysm, nonruptured: Secondary | ICD-10-CM

## 2021-04-07 DIAGNOSIS — J449 Chronic obstructive pulmonary disease, unspecified: Secondary | ICD-10-CM | POA: Insufficient documentation

## 2021-04-07 DIAGNOSIS — I739 Peripheral vascular disease, unspecified: Secondary | ICD-10-CM | POA: Diagnosis not present

## 2021-04-07 DIAGNOSIS — Z8673 Personal history of transient ischemic attack (TIA), and cerebral infarction without residual deficits: Secondary | ICD-10-CM | POA: Insufficient documentation

## 2021-04-07 DIAGNOSIS — Z87891 Personal history of nicotine dependence: Secondary | ICD-10-CM | POA: Diagnosis not present

## 2021-04-07 HISTORY — DX: Peripheral vascular disease, unspecified: I73.9

## 2021-04-07 HISTORY — DX: Cerebral aneurysm, nonruptured: I67.1

## 2021-04-07 HISTORY — PX: IR ANGIO VERTEBRAL SEL SUBCLAVIAN INNOMINATE BILAT MOD SED: IMG5366

## 2021-04-07 HISTORY — DX: Cerebral infarction, unspecified: I63.9

## 2021-04-07 HISTORY — DX: Dyspnea, unspecified: R06.00

## 2021-04-07 HISTORY — PX: RADIOLOGY WITH ANESTHESIA: SHX6223

## 2021-04-07 HISTORY — PX: IR ANGIO INTRA EXTRACRAN SEL COM CAROTID INNOMINATE BILAT MOD SED: IMG5360

## 2021-04-07 LAB — CBC WITH DIFFERENTIAL/PLATELET
Abs Immature Granulocytes: 0.02 10*3/uL (ref 0.00–0.07)
Basophils Absolute: 0.1 10*3/uL (ref 0.0–0.1)
Basophils Relative: 1 %
Eosinophils Absolute: 0.2 10*3/uL (ref 0.0–0.5)
Eosinophils Relative: 3 %
HCT: 43 % (ref 36.0–46.0)
Hemoglobin: 14 g/dL (ref 12.0–15.0)
Immature Granulocytes: 0 %
Lymphocytes Relative: 29 %
Lymphs Abs: 2.8 10*3/uL (ref 0.7–4.0)
MCH: 29.1 pg (ref 26.0–34.0)
MCHC: 32.6 g/dL (ref 30.0–36.0)
MCV: 89.4 fL (ref 80.0–100.0)
Monocytes Absolute: 0.7 10*3/uL (ref 0.1–1.0)
Monocytes Relative: 7 %
Neutro Abs: 5.9 10*3/uL (ref 1.7–7.7)
Neutrophils Relative %: 60 %
Platelets: 296 10*3/uL (ref 150–400)
RBC: 4.81 MIL/uL (ref 3.87–5.11)
RDW: 12.6 % (ref 11.5–15.5)
WBC: 9.7 10*3/uL (ref 4.0–10.5)
nRBC: 0 % (ref 0.0–0.2)

## 2021-04-07 LAB — BASIC METABOLIC PANEL
Anion gap: 7 (ref 5–15)
BUN: 15 mg/dL (ref 6–20)
CO2: 26 mmol/L (ref 22–32)
Calcium: 9.3 mg/dL (ref 8.9–10.3)
Chloride: 107 mmol/L (ref 98–111)
Creatinine, Ser: 0.89 mg/dL (ref 0.44–1.00)
GFR, Estimated: >60 mL/min (ref >60)
Glucose, Bld: 101 mg/dL — ABNORMAL HIGH (ref 70–99)
Potassium: 3.9 mmol/L (ref 3.5–5.1)
Sodium: 140 mmol/L (ref 135–145)

## 2021-04-07 LAB — PROTIME-INR
INR: 1 (ref 0.8–1.2)
Prothrombin Time: 12.8 seconds (ref 11.4–15.2)

## 2021-04-07 LAB — SARS CORONAVIRUS 2 BY RT PCR (HOSPITAL ORDER, PERFORMED IN ~~LOC~~ HOSPITAL LAB): SARS Coronavirus 2: NEGATIVE

## 2021-04-07 SURGERY — IR WITH ANESTHESIA
Anesthesia: General

## 2021-04-07 MED ORDER — ORAL CARE MOUTH RINSE: 15.0000 mL | Freq: Once | OROMUCOSAL | Status: AC

## 2021-04-07 MED ORDER — MIDAZOLAM HCL 2 MG/2ML IJ SOLN
INTRAMUSCULAR | Status: DC | PRN
  Administered 2021-04-07 (×2): 1 mg via INTRAVENOUS

## 2021-04-07 MED ORDER — LIDOCAINE HCL 1 % IJ SOLN
INTRAMUSCULAR | Status: AC | PRN
  Administered 2021-04-07: 10 mL

## 2021-04-07 MED ORDER — FENTANYL CITRATE (PF) 100 MCG/2ML IJ SOLN
INTRAMUSCULAR | Status: DC | PRN
  Administered 2021-04-07 (×2): 25 ug via INTRAVENOUS

## 2021-04-07 MED ORDER — HEPARIN SODIUM (PORCINE) 1000 UNIT/ML IJ SOLN
INTRAMUSCULAR | Status: AC
  Filled 2021-04-07: qty 1

## 2021-04-07 MED ORDER — CHLORHEXIDINE GLUCONATE 0.12 % MT SOLN: 15.0000 mL | Freq: Once | OROMUCOSAL | Status: AC

## 2021-04-07 MED ORDER — SODIUM CHLORIDE 0.9 % IV SOLN: INTRAVENOUS | Status: DC

## 2021-04-07 MED ORDER — CEFAZOLIN SODIUM-DEXTROSE 2-4 GM/100ML-% IV SOLN
2.0000 g | INTRAVENOUS | Status: DC
  Filled 2021-04-07: qty 100

## 2021-04-07 MED ORDER — FENTANYL CITRATE (PF) 100 MCG/2ML IJ SOLN: 25.0000 ug | INTRAMUSCULAR | Status: DC | PRN

## 2021-04-07 MED ORDER — NITROGLYCERIN 1 MG/10 ML FOR IR/CATH LAB
INTRA_ARTERIAL | Status: AC
  Filled 2021-04-07: qty 10

## 2021-04-07 MED ORDER — VERAPAMIL HCL 2.5 MG/ML IV SOLN
INTRAVENOUS | Status: AC
  Filled 2021-04-07: qty 2

## 2021-04-07 MED ORDER — OXYCODONE HCL 5 MG PO TABS: 5.0000 mg | ORAL_TABLET | Freq: Once | ORAL | Status: DC | PRN

## 2021-04-07 MED ORDER — CHLORHEXIDINE GLUCONATE 0.12 % MT SOLN
OROMUCOSAL | Status: AC
  Administered 2021-04-07: 15 mL via OROMUCOSAL
  Filled 2021-04-07: qty 15

## 2021-04-07 MED ORDER — IOHEXOL 300 MG/ML  SOLN
50.0000 mL | Freq: Once | INTRAMUSCULAR | Status: AC | PRN
  Administered 2021-04-07: 35 mL

## 2021-04-07 MED ORDER — ONDANSETRON HCL 4 MG/2ML IJ SOLN: 4.0000 mg | Freq: Once | INTRAMUSCULAR | Status: DC | PRN

## 2021-04-07 MED ORDER — IOHEXOL 300 MG/ML  SOLN
50.0000 mL | Freq: Once | INTRAMUSCULAR | Status: AC | PRN
  Administered 2021-04-07: 25 mL

## 2021-04-07 MED ORDER — LIDOCAINE HCL 1 % IJ SOLN
INTRAMUSCULAR | Status: AC
  Filled 2021-04-07: qty 20

## 2021-04-07 MED ORDER — OXYCODONE HCL 5 MG/5ML PO SOLN: 5.0000 mg | Freq: Once | ORAL | Status: DC | PRN

## 2021-04-07 MED ORDER — LIDOCAINE 2% (20 MG/ML) 5 ML SYRINGE
INTRAMUSCULAR | Status: DC | PRN
Start: 2021-04-07 — End: 2021-04-07
  Administered 2021-04-07: 40 mg via INTRAVENOUS

## 2021-04-07 MED ORDER — LACTATED RINGERS IV SOLN: INTRAVENOUS | Status: DC

## 2021-04-07 MED ORDER — IOHEXOL 300 MG/ML  SOLN
100.0000 mL | Freq: Once | INTRAMUSCULAR | Status: AC | PRN
  Administered 2021-04-07: 25 mL

## 2021-04-07 NOTE — Transfer of Care (Signed)
Immediate Anesthesia Transfer of Care Note  Patient: Leslie Franklin  Procedure(s) Performed: EMBOLIZATION  Patient Location: PACU  Anesthesia Type:MAC  Level of Consciousness: awake, alert  and oriented  Airway & Oxygen Therapy: Patient Spontanous Breathing  Post-op Assessment: Report given to RN and Post -op Vital signs reviewed and stable  Post vital signs: Reviewed and stable  Last Vitals:  Vitals Value Taken Time  BP 130/68 04/07/21 0951  Temp    Pulse 69 04/07/21 0954  Resp 15 04/07/21 0954  SpO2 97 % 04/07/21 0954  Vitals shown include unvalidated device data.  Last Pain:  Vitals:   04/07/21 0635  TempSrc:   PainSc: 0-No pain         Complications: No notable events documented.

## 2021-04-07 NOTE — Consult Note (Addendum)
Chief Complaint: Patient was seen in consultation today for Cerebral arteriogram with possible right internal carotid artery aneurysm embolization - possible left posterior communicating artery aneurysm embolization at the request of Dr Milana Kidney   Supervising Physician: Julieanne Cotton  Patient Status: [facility] - Out-pt  History of Present Illness: Leslie Franklin is a 60 y.o. 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  MRA:  03/06/21:  IMPRESSION: 1. No intracranial large vessel occlusion or proximal high-grade arterial stenosis. 2. 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 Neuro-interventional consultation is recommended. 3. 1-2 mm aneurysm arising from the cavernous left ICA.  Was seen by Dr Corliss Skains in consultation 03/11/21.  Started Plavix 10 days ago P2y12 5 on 04/02/21  Scheduled for cerebral arteriogram today And possible intervention.     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   Screening mammogram for breast cancer 09/12/2020   Stroke (HCC)    Syncope 09/12/2020    Past Surgical History:  Procedure Laterality Date   INSERT / REPLACE / REMOVE PACEMAKER     IR RADIOLOGIST EVAL & MGMT  03/12/2021   NO PAST SURGERIES     PACEMAKER IMPLANT N/A  10/21/2020   Procedure: PACEMAKER IMPLANT;  Surgeon: Marinus Maw, MD;  Location: [facility];  Service: Cardiovascular;  Laterality: N/A;    Allergies: Patient has no known allergies.  Medications: Prior to Admission medications   Medication Sig Start Date End Date Taking? Authorizing Provider  aspirin 81 MG chewable tablet Chew 81 mg by mouth daily.   Yes [provider]  Budeson-Glycopyrrol-Formoterol (BREZTRI AEROSPHERE) 160-9-4.8 MCG/ACT AERO Inhale 2 puffs into the lungs 2 (two) times daily. 03/27/21  Yes [name], NP  clopidogrel (PLAVIX) 75 MG tablet Take 75 mg by mouth daily.   Yes [provider]  rosuvastatin (CRESTOR) 20 MG tablet Take 1 tablet (20 mg total) by mouth daily. 03/27/21 06/25/21 Yes [name], NP  sertraline (ZOLOFT) 25 MG tablet Take 1 tablet (25 mg total) by mouth daily. 03/27/21  Yes [name], NP  sertraline (ZOLOFT) 50 MG tablet Take 1 tablet (50 mg total) by mouth daily. 03/27/21  Yes [name], NP  albuterol (VENTOLIN HFA) 108 (90 Base) MCG/ACT inhaler Inhale 2 puffs into the lungs every 6 (six) hours as needed for wheezing or shortness of breath. Patient not taking: Reported on 04/02/2021 10/14/20   [name], NP  hydrOXYzine (ATARAX/VISTARIL) 25 MG tablet Take 1 tablet (25 mg total) by mouth 3 (three) times daily as needed for anxiety. Patient not taking: Reported on 04/02/2021 01/14/21   [name], NP  traZODone (DESYREL) 50 MG tablet Take 1 tablet (50 mg total) by mouth at bedtime. Patient not taking: Reported on 04/02/2021 09/12/20   Wandra Feinstein, MD  Family History  Problem Relation Age of Onset   Hypertension Mother    Diabetes Mother    Heart attack Father    Stroke Father    Emphysema Sister    Stroke Brother     Social History   Socioeconomic History   Marital status: Single    Spouse name: Not on file   Number of children: 4   Years of education: Not on file    Highest education level: Not on file  Occupational History   Not on file  Tobacco Use   Smoking status: Former    Packs/day: 1.00    Years: 41.00    Pack years: 41.00    Types: Cigarettes   Smokeless tobacco: Never  Vaping Use   Vaping Use: Never used  Substance and Sexual Activity   Alcohol use: Never   Drug use: Never   Sexual activity: Not on file  Other Topics Concern   Not on file  Social History Narrative   Not on file   Social Determinants of Health   Financial Resource Strain: Not on file  Food Insecurity: Not on file  Transportation Needs: Not on file  Physical Activity: Not on file  Stress: Not on file  Social Connections: Not on file    Review of Systems: A 12 point ROS discussed and pertinent positives are indicated in the HPI above.  All other systems are negative.  Review of Systems  Constitutional:  Negative for activity change, fatigue, fever and unexpected weight change.  HENT:  Negative for tinnitus and trouble swallowing.   Eyes:  Negative for visual disturbance.  Respiratory:  Negative for cough and shortness of breath.   Cardiovascular:  Negative for chest pain.  Gastrointestinal:  Negative for abdominal pain.  Musculoskeletal:  Negative for back pain.  Neurological:  Negative for dizziness, tremors, seizures, syncope, facial asymmetry, speech difficulty, weakness, light-headedness, numbness and headaches.  Psychiatric/Behavioral:  Negative for behavioral problems, confusion and decreased concentration.    Vital Signs: BP (!) 141/51   Pulse 69   Temp 97.7 F (36.5 C) (Oral)   Resp 18   Ht 5\' 4"  (1.626 m)   Wt 115 lb (52.2 kg)   SpO2 99%   BMI 19.74 kg/m   Physical Exam Vitals reviewed.  HENT:     Mouth/Throat:     Mouth: Mucous membranes are moist.  Eyes:     Extraocular Movements: Extraocular movements intact.  Cardiovascular:     Rate and Rhythm: Normal rate and regular rhythm.     Heart sounds: Normal heart sounds.  Pulmonary:      Effort: Pulmonary effort is normal.     Breath sounds: Normal breath sounds.  Abdominal:     General: Bowel sounds are normal.     Palpations: Abdomen is soft.     Tenderness: There is no abdominal tenderness.  Musculoskeletal:        General: No swelling. Normal range of motion.     Cervical back: Normal range of motion.     Right lower leg: No edema.     Left lower leg: No edema.  Skin:    General: Skin is warm.  Neurological:     Mental Status: She is alert and oriented to person, place, and time.  Psychiatric:        Mood and Affect: Mood normal.        Behavior: Behavior normal.        Thought Content: Thought content normal.  Judgment: Judgment normal.    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 The patient is accompanied by her daughter. The patient reports history of dizziness and what she describes as a vertigo which started approximately 4 years ago. She describes the symptoms usually proceeded by sudden standing up from a sitting position or sitting up from a lying position which is usually accompanied by blurred vision. However, she does not have nausea or vomiting associated with these. She reports 2 spells of complete loss of consciousness without seizure-like activity. She denies any diplopia,  loss of vision, speech difficulties or motor abnormalities during these episodes. No history of pulsatile tinnitus, or of hearing loss. Diagnosis * : Date . * : Anxiety * : 09/13/2020 . * : Cardiac murmur * : 09/18/2020 . * : Chest pain of uncertain etiology * : 09/18/2020 . * : Claudication (HCC) * : 09/12/2020 . * : COPD (chronic obstructive pulmonary disease) (HCC) * : . * : Depression * : . * : Ex-smoker * : 09/18/2020 . * : Hyperlipidemia * : . * : Palpitations * : 09/18/2020 . * : Screening mammogram for breast cancer * : 09/12/2020 . * : Syncope * : 09/12/2020 PAST SURGICAL HISTORY: Past Surgical History: Procedure * : Laterality * : Date . * : NO PAST SURGERIES * : * : . * : PACEMAKER IMPLANT * : N/A * : 10/21/2020 * : Procedure: PACEMAKER IMPLANT; Surgeon: Marinus Maw, MD; Location: [facility]; Service: Cardiovascular; Laterality: N/A; MEDICATIONS: Current Outpatient Medications on File Prior to Visit Medication * : Sig * : Dispense * : Refill . * : albuterol (VENTOLIN HFA) 108 (90 Base) MCG/ACT inhaler * : Inhale 2 puffs into the lungs every 6 (six) hours as needed for wheezing or shortness of breath. * : 8 g * : 0 . * : aspirin 81 MG chewable tablet * : Chew 81 mg by mouth daily. * : * : . * : diphenoxylate-atropine (LOMOTIL) 2.5-0.025 MG tablet * : Take 1 tablet by mouth 4 (four) times daily as needed for diarrhea or loose stools. * : 30 tablet * : 0 . * : GAVILYTE-G 236 g solution * : See admin instructions. * : * : . * : hydrOXYzine (ATARAX/VISTARIL) 25 MG tablet * : Take 1 tablet (25 mg total) by mouth 3 (three) times daily as needed for anxiety. * : 30 tablet * : 0 . * : ondansetron (ZOFRAN ODT) 4 MG disintegrating tablet * : Take 1 tablet (4 mg total) by mouth every 8 (eight) hours as needed for nausea or vomiting. * : 20 tablet * : 0 . * : rosuvastatin (CRESTOR) 20 MG tablet * : Take 1 tablet (20 mg total) by mouth daily. * : 90 tablet * : 3 . * : sertraline (ZOLOFT) 50 MG tablet * : Take 1  tablet (50 mg total) by mouth daily. * : 90 tablet * :  0 . * : traZODone (DESYREL) 50 MG tablet * : Take 1 tablet (50 mg total) by mouth at bedtime. * : 30 tablet * : 3 ALLERGIES:No Known allergies Family History Problem * : Relation * : Age of Onset . * : Hypertension * : Mother * : . * : Diabetes * : Mother * : . * : Heart attack * : Father * : . * : Emphysema * : Sister * : . * : Stroke * : Brother * : REVIEW OF SYSTEMS: Unremarkable unless as mentioned above. PHYSICAL EXAMINATION: Alert, awake, oriented to time place space. Speech and comprehension normal. Normal affect. Normal eye contact. Grossly no evidence of lateralizing neurological abnormalities. Station and gait normal. ASSESSMENT AND PLAN: Findings on the MRI MRA examination reviewed with patient and daughter. They were informed of the large approximately 9.1 mm x 8.7 mm right paraophthalmic region aneurysm. Also suspect outpouching in the left posterior communicating artery region which may represent an aneurysm. Also noted on the MRA of the neck is a significant flow signal dropout of the second horizontal portion of the right vertebral artery proximal to the right vertebrobasilar junction. The natural history of unruptured intracranial aneurysm was reviewed in detail. Risks of a rupture of 1-2% resulting in subarachnoid hemorrhage with potential for significant morbidity and mortality was reviewed. Increased risk of rupture association include female gender, family history of intracranial aneurysms, hypertension, hyperlipidemia and smoking. Management considerations of the large right ICA intracranial aneurysm and possibly the left PCOM aneurysm were those of continued medical surveillance with MRI examinations versus consideration of elimination of the aneurysm from the parent circulation in order to prevent potential intracranial rupture, aneurysm growth, and the possibility of recurrent TIAs related to micro emboli. Patient expressed her desire to  proceed with endovascular treatment. Options of stent assisted coiling versus flow diversion, versus intra saccular disruption device were reviewed. Potential risks of micro embolic phenomenon with ways of prevention, and potential risk of intraprocedural rupture versus remote chance of a delayed intracranial hemorrhage were discussed. Patient was informed further workup would entail a diagnostic catheter arteriogram for adequate evaluation of the architecture of the aneurysms, and accurate evaluation of the suspicious high-grade stenosis of the distal right vertebral artery. Patient also would like to have endovascular treatment at the same time of diagnostic arteriogram. Diagnostic arteriogram with intent to treat will be scheduled at the same setting as soon as possible. Patient will be started on aspirin 81 mg which she already is on, and Plavix 75 mg once a day 10 days prior to the procedure. Questions were answered to their satisfaction. They were asked to call should he any concerns or questions. Electronically Signed   By: Julieanne Cotton M.D.   On: 03/11/2021 14:22    Labs:  CBC: Recent Labs    09/13/20 1205 10/18/20 1148 12/25/20 0956 03/27/21 1000  WBC 7.8 9.8 8.3 9.4  HGB 14.9 15.2 15.8 15.1  HCT 44.7 45.4 46.9* 46.2  PLT 334 339 260 313    COAGS: Recent Labs    04/07/21 0552  INR 1.0    BMP: Recent Labs    09/13/20 1205 10/18/20 1148 12/25/20 0956 03/27/21 1000  NA 137 138 142 144  K 4.6 4.3 4.4 5.1  CL 99 100 104 104  CO2 23 20 23 24   GLUCOSE 92 93 95 83  BUN 17 12 14 15   CALCIUM 9.7 9.7 9.9 9.7  CREATININE 1.03* 0.83 1.02* 0.91  GFRNONAA 60  79  --   --   GFRAA 70 91  --   --     LIVER FUNCTION TESTS: Recent Labs    09/13/20 1205 12/25/20 0956 03/27/21 1000  BILITOT 0.5 0.4 <0.2  AST 19 27 17   ALT 12 20 10   ALKPHOS 111 118 122*  PROT 6.7 7.1 6.6  ALBUMIN 4.2 4.5 4.5    TUMOR MARKERS: No results for input(s): AFPTM, CEA, CA199, CHROMGRNA in  the last 8760 hours.  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 Scheduled today with Dr Corliss Skainseveshwar for Cerebral arteriogram with possible R ICA aneurysm and L PCOM aneurysm embolization. P2y12 5 on 8/3-- takig ASA/Plavix daily. Dr Corliss Skainseveshwar has spoken to pt-- may only proceed with arteriogram today-- pt is aware. Risks and benefits of cerebral angiogram with intervention were discussed with the patient including, but not limited to bleeding, infection, vascular injury, contrast induced renal failure, stroke or even death.  This interventional procedure involves the use of X-rays and because of the nature of the planned procedure, it is possible that we will have prolonged use of X-ray fluoroscopy.  Potential radiation risks to you include (but are not limited to) the following: - A slightly elevated risk for cancer  several years later in life. This risk is typically less than 0.5% percent. This risk is low in comparison to the normal incidence of human cancer, which is 33% for women and 50% for men according to the [employer]. - Radiation induced injury can include skin redness, resembling a rash, tissue breakdown / ulcers and hair loss (which can be temporary or permanent).   The likelihood of either of these occurring depends on the difficulty of the procedure and whether you are sensitive to radiation due to previous procedures, disease, or genetic conditions.   IF your procedure requires a prolonged use of radiation, you will be notified and given written instructions for further action.  It is your responsibility to monitor the irradiated area for the 2 weeks following the procedure and to notify your physician if you are concerned that you have suffered a radiation induced injury.    All of the patient's questions were answered, patient is agreeable to proceed. Consent signed and in  chart.   Thank you for this interesting consult.  I greatly enjoyed meeting Geanie LoganJudy Blaze and look forward to participating in their care.  A copy of this report was sent to the requesting provider on this date.  Electronically Signed: Robet LeuPamela A Kiyanna Biegler, PA-C 04/07/2021, 8:06 AM   I spent a total of  30 Minutes   in face to face in clinical consultation, greater than 50% of which was counseling/coordinating care for cerebral arteriogram with poss R ICA aneurysm embolization/poss L PCOM artery aneurysm  embolization

## 2021-04-07 NOTE — Anesthesia Postprocedure Evaluation (Signed)
Anesthesia Post Note  Patient: Leslie Franklin  Procedure(s) Performed: EMBOLIZATION     Patient location during evaluation: PACU Anesthesia Type: MAC Level of consciousness: awake and alert Pain management: pain level controlled Vital Signs Assessment: post-procedure vital signs reviewed and stable Respiratory status: spontaneous breathing, nonlabored ventilation and respiratory function stable Cardiovascular status: blood pressure returned to baseline and stable Postop Assessment: no apparent nausea or vomiting Anesthetic complications: no   No notable events documented.  Last Vitals:  Vitals:   04/07/21 0951 04/07/21 1006  BP: 130/68 (!) 143/65  Pulse: 65 64  Resp: 20 15  Temp: 36.6 C   SpO2: 96% 99%    Last Pain:  Vitals:   04/07/21 1006  TempSrc:   PainSc: 0-No pain                 Pervis Hocking

## 2021-04-07 NOTE — Sedation Documentation (Signed)
Anesthesia caring for patient

## 2021-04-07 NOTE — Procedures (Signed)
S/P 4 vessel cetrebral arteriogram Rt CFA approach. Findings. 1.Large approx  56mm x 7 mm RT ICA para ophthalmic aneurysm. S.Shanterica Biehler MD

## 2021-04-07 NOTE — Progress Notes (Signed)
Do not give medications (ASA, Plavix, or Nimotop) on DOS, per Dr. Corliss Skains.

## 2021-04-07 NOTE — Anesthesia Procedure Notes (Signed)
Procedure Name: MAC Date/Time: 04/07/2021 8:25 AM Performed by: Mariea Clonts, CRNA Pre-anesthesia Checklist: Patient identified, Emergency Drugs available, Timeout performed, Patient being monitored and Suction available Patient Re-evaluated:Patient Re-evaluated prior to induction Oxygen Delivery Method: Nasal cannula

## 2021-04-10 ENCOUNTER — Other Ambulatory Visit (HOSPITAL_COMMUNITY): Payer: Self-pay | Admitting: Radiology

## 2021-04-10 DIAGNOSIS — I671 Cerebral aneurysm, nonruptured: Secondary | ICD-10-CM

## 2021-04-12 LAB — PLATELET INHIBITION P2Y12: Platelet Function  P2Y12: 88 [PRU] — ABNORMAL LOW (ref 182–335)

## 2021-04-17 ENCOUNTER — Other Ambulatory Visit (HOSPITAL_COMMUNITY): Payer: Self-pay | Admitting: Interventional Radiology

## 2021-04-17 DIAGNOSIS — I671 Cerebral aneurysm, nonruptured: Secondary | ICD-10-CM

## 2021-04-18 ENCOUNTER — Other Ambulatory Visit (HOSPITAL_COMMUNITY): Payer: Self-pay | Admitting: Interventional Radiology

## 2021-04-18 ENCOUNTER — Encounter: Payer: Self-pay | Admitting: Internal Medicine

## 2021-04-18 ENCOUNTER — Encounter (HOSPITAL_COMMUNITY): Payer: Self-pay | Admitting: Interventional Radiology

## 2021-04-18 ENCOUNTER — Other Ambulatory Visit: Payer: Self-pay | Admitting: Radiology

## 2021-04-18 ENCOUNTER — Other Ambulatory Visit: Payer: Self-pay | Admitting: Student

## 2021-04-18 LAB — SARS CORONAVIRUS 2 (TAT 6-24 HRS): SARS Coronavirus 2: NEGATIVE

## 2021-04-18 NOTE — Progress Notes (Signed)
PERIOPERATIVE PRESCRIPTION FOR IMPLANTED CARDIAC DEVICE PROGRAMMING  Patient Information: Name:  Abia Monaco  DOB:  [birth year]  MRN:  [id number]    Deloris Ping, RN  [facility] Device Planned Procedure:  RADIOLOGY WITH ANESTHESIA EMBOLIZATION  Surgeon:  Dr Julieanne Cotton  Date of Procedure:  04/21/21  Cautery will be used.  Position during surgery:  supine   Please send documentation back to:  [facility] (Fax # [phone])   Sammie Bench, RN  04/18/2021 2:07 PM  Device Information:  Clinic EP Physician:  Lewayne Bunting, MD   Device Type:  Pacemaker Manufacturer and Phone #:  Biotronik: [phone] Pacemaker Dependent?:  No. Date of Last Device Check:  01/21/21 Normal Device Function?:  Yes.    Electrophysiologist's Recommendations:  Have magnet available. Provide continuous ECG monitoring when magnet is used or reprogramming is to be performed.  Procedure may interfere with device function.  Magnet should be placed over device during procedure.  Per Device Clinic Standing Orders, Linton Ham, RN  4:09 PM 04/18/2021

## 2021-04-18 NOTE — Progress Notes (Signed)
DUE TO COVID-19 ONLY ONE VISITOR IS ALLOWED TO COME WITH YOU AND STAY IN THE WAITING ROOM ONLY DURING PRE OP AND PROCEDURE DAY OF SURGERY.   Two  VISITORS  MAY VISIT WITH YOU AFTER SURGERY IN YOUR PRIVATE ROOM DURING VISITING HOURS ONLY!  PCP - Flonnie Hailstone, NP Cardiologist - Dr Belva Crome Neurologist - Shon Millet, DO EP - Dr Lewayne Bunting  Chest x-ray - 10/21/20 (2V) EKG - 01/21/21 Stress Test - 09/24/20 ECHO - 10/14/20 Cardiac Cath - n/a  ICD Pacemaker - Biotronik Pacemaker, last remote check was on 01/20/21. Called and informed Biotronik Rep Ector with surgery date/time. Device orders requested from Dr Ladona Ridgel, waiting results.  Sleep Study -  n/a CPAP - none  Blood Thinner Instructions:  Follow your surgeon's instructions on when to stop plavix prior to surgery.  Aspirin Instructions: Follow your surgeon's instructions on when to stop aspirin prior to surgery,  If no instructions were given by your surgeon then you will need to call the office for those instructions.  Anesthesia review: Yes  STOP now taking any Aspirin (unless otherwise instructed by your surgeon), Aleve, Naproxen, Ibuprofen, Motrin, Advil, Goody's, BC's, all herbal medications, fish oil, and all vitamins.   Coronavirus Screening Covid test is scheduled on 04/18/21  Do you have any of the following symptoms:  Cough yes/no: No Fever (>100.60F)  yes/no: No Runny nose yes/no: No Sore throat yes/no: No Difficulty breathing/shortness of breath  yes/no: No  Have you traveled in the last 14 days and where? yes/no: No  Patient verbalized understanding of instructions that were given via phone.

## 2021-04-18 NOTE — Progress Notes (Signed)
Anesthesia Chart Review:  Case: [id number] Date/Time: 04/21/21 0815   Procedure: RADIOLOGY WITH ANESTHESIA EMBOLIZATION   Anesthesia type: General   Pre-op diagnosis: ANUERYSM   Location: [facility] OR RADIOLOGY ROOM / [place] [facility]   Surgeons: Luanne Bras, MD       DISCUSSION: Patient is a 60 year old female scheduled for embolization of large RICA periophthalmic aneurysm. Previously MRA showed a large right paraophthalmic region aneurysm and also suspected outpouching in the left PCA region which may represent an aneurysm. Patient desired endovascular treatment of RICA and possible left PCOM aneurysms at the same time as her diagnostic arteriogram. This was initially planned for 04/07/21; however P2y12 was low, so only 4V cerebral arteriogram done. 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 - Prominent high-riding right internal jugular bulb measuring 14.3 mm x 10.5 mm projecting superolaterally.   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 - Patient also advised to reduce her Plavix to 37.5 mg once a day, and continue with aspirin 325 mg a day.     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    Last visit with EP Dr. Lovena Le 01/21/2021.  She was pacing approximately 30% of the time.  Her Biotronik DDD PPM was working normally.  Interrogation did show up to a  minute of AT/AF which will be monitored for further episodes.    Last cardiology visit with Dr. Geraldo Pitter 02/26/2021.  73-monthfollow-up plan.   She is a same day work-up, so labs as indicated and anesthesia team evaluation on the day of surgery. Still awaiting perioperative cardiac device instructions for her Biotronik PPM.     VS:  BP Readings from Last 3 Encounters:  04/07/21 113/72  04/07/21 (!) 150/78  03/27/21 124/66   Pulse Readings from Last 3 Encounters:  04/07/21 69  04/07/21 77  03/27/21 81     PROVIDERS: HRip Harbour NP is PCP  ([facility]) Revankar, RSunny Schlein MD is cardiologist TCristopher Peru MD is EP JMetta Clines DO is neurologist  LABS: For day of procedure as indicated. Currently last p2y12 88 on 04/10/21 and other most recent results include: Lab Results  Component Value Date   WBC 9.7 04/07/2021   HGB 14.0 04/07/2021   HCT 43.0 04/07/2021   PLT 296 04/07/2021   GLUCOSE 101 (H) 04/07/2021   CHOL 154 03/27/2021   TRIG 87 03/27/2021   HDL 49 03/27/2021   LDLCALC 89 03/27/2021   ALT 10 03/27/2021   AST 17 03/27/2021   NA 140 04/07/2021   K 3.9 04/07/2021   CL 107 04/07/2021   CREATININE 0.89 04/07/2021   BUN 15 04/07/2021   CO2 26 04/07/2021   TSH 1.220 09/13/2020   INR 1.0 04/07/2021     IMAGES: CXR 10/21/20: FINDINGS: Interval placement of a left-sided pacemaker with leads overlying the right atrial appendage and right ventricle. The heart size and mediastinal contours are within normal limits. Aortic atherosclerosis. No focal consolidation. No visible  pneumothorax. No pleural effusion. The visualized skeletal structures are unremarkable. IMPRESSION: Interval placement of a left-sided pacemaker. No visible pneumothorax.   EKG: 01/21/21 ([facility]): NSR with atrial pacing     CV: EP Procedure 10/21/20: CONCLUSIONS:  1. Successful implantation of a Biotronik dual-chamber pacemaker for symptomatic bradycardia due to CHB   2. No early apparent complications.     Echo 10/14/20: IMPRESSIONS   1. Left ventricular ejection fraction, by estimation, is 60 to 65%. The  left ventricle has normal function. The left ventricle has no regional  wall motion abnormalities. Left ventricular diastolic parameters were  normal.   2. Right ventricular systolic function is normal. The right ventricular  size is normal. There is normal pulmonary artery systolic pressure.   3. The mitral valve is normal in structure. No evidence of mitral valve  regurgitation. No evidence of mitral stenosis.   4. The aortic valve is tricuspid. Aortic valve regurgitation is not  visualized. No aortic stenosis is present.   5. The inferior vena cava is normal in size with greater than 50%  respiratory variability, suggesting right atrial pressure of 3 mmHg.      Long term monitor 09/18/20-10/02/20: Study Highlights  - Patient had a min HR of 26 bpm, max HR of 145 bpm, and avg HR of 84 bpm. - Predominant underlying rhythm was Sinus Rhythm. 1 run of Supraventricular Tachycardia occurred lasting 7 beats with a max rate of 102 bpm (avg 98 bpm). 1 Pause occurred lasting 4.6 secs (13 bpm). Pause Occurred due to High Grade AV Block. 3 episode(s) of AV Block (High Grade) occurred, lasting a total of 16 secs. Isolated SVEs were rare (<1.0%), SVE Couplets were rare (<1.0%), and SVE Triplets were rare (<1.0%).  Isolated VEs were rare (<1.0%), and no VE Couplets or VE Triplets were present. Difficulty discerning atrial activity at times making definitive diagnosis difficult to ascertain. - MD notification criteria for High Grade AV Block (4.6 seconds of Ventricular Asystole) met - report posted prior to notification per account request (ahs).    Nuclear stress test 09/24/20: Nuclear stress EF: 64%. There was no ST segment deviation noted during stress. This is a low risk study. The left ventricular ejection fraction is normal (55-65%).   Past Medical  History:  Diagnosis Date   Anxiety 09/13/2020   Cardiac murmur 09/18/2020   Chest pain of uncertain etiology 29/93/7169   Claudication ([place]) 09/12/2020   COPD (chronic obstructive pulmonary disease) ([place])    Depression    Dyspnea    Ex-smoker 09/18/2020   Heart block AV complete ([place]) 11/20/2020   Hyperlipidemia    Palpitations 09/18/2020   Peripheral vascular disease ([place])    Presence of permanent cardiac pacemaker 11/20/2020   Screening mammogram for breast cancer 09/12/2020   Stroke ([place])    Syncope 09/12/2020    Past Surgical History:  Procedure Laterality Date   INSERT / REPLACE / REMOVE PACEMAKER     IR ANGIO INTRA EXTRACRAN SEL COM CAROTID INNOMINATE BILAT MOD SED  04/07/2021   IR ANGIO VERTEBRAL SEL SUBCLAVIAN INNOMINATE BILAT MOD SED  04/07/2021   IR RADIOLOGIST EVAL & MGMT  03/12/2021   NO PAST SURGERIES     PACEMAKER IMPLANT N/A 10/21/2020   Procedure: PACEMAKER IMPLANT;  Surgeon: Evans Lance, MD;  Location: [facility];  Service: Cardiovascular;  Laterality: N/A;   RADIOLOGY WITH ANESTHESIA N/A 04/07/2021   Procedure: EMBOLIZATION;  Surgeon: Luanne Bras, MD;  Location: [place];  Service: Radiology;  Laterality: N/A;    MEDICATIONS: No current facility-administered medications for this encounter.    aspirin 81 MG chewable tablet   Budeson-Glycopyrrol-Formoterol (BREZTRI AEROSPHERE) 160-9-4.8 MCG/ACT AERO   clopidogrel (PLAVIX) 75 MG tablet   hydrOXYzine (ATARAX/VISTARIL) 25 MG tablet   rosuvastatin (CRESTOR) 20 MG tablet   sertraline (ZOLOFT) 25 MG tablet   sertraline (ZOLOFT) 50 MG tablet   traZODone (DESYREL) 50 MG tablet    Myra Gianotti, PA-C Surgical Short Stay/Anesthesiology [facility] Phone [phone] [facility] Phone [phone] 04/18/2021 1:44 PM

## 2021-04-18 NOTE — Anesthesia Preprocedure Evaluation (Addendum)
Anesthesia Evaluation  Patient identified by MRN, date of birth, ID band Patient awake    Reviewed: Allergy & Precautions, NPO status , Patient's Chart, lab work & pertinent test results  Airway Mallampati: I       Dental  (+) Edentulous Upper, Edentulous Lower   Pulmonary COPD,  COPD inhaler, former smoker,    Pulmonary exam normal        Cardiovascular + Peripheral Vascular Disease  Normal cardiovascular exam+ dysrhythmias + pacemaker      Neuro/Psych PSYCHIATRIC DISORDERS Anxiety Depression CVA, No Residual Symptoms    GI/Hepatic negative GI ROS, Neg liver ROS,   Endo/Other  negative endocrine ROS  Renal/GU negative Renal ROS     Musculoskeletal   Abdominal (+) + obese,   Peds  Hematology negative hematology ROS (+)   Anesthesia Other Findings    Expand All Collapse All  Anesthesia Chart Review:   Case: [id number] Date/Time: 04/21/21 0815  Procedure: RADIOLOGY WITH ANESTHESIA EMBOLIZATION  Anesthesia type: General  Pre-op diagnosis: ANUERYSM  Location: [facility] OR RADIOLOGY ROOM / [place] [facility]  Surgeons: Luanne Bras, MD      DISCUSSION: Patient is a 60 year old female scheduled for embolization of large RICA periophthalmic aneurysm. Previously MRA showed a large right paraophthalmic region aneurysm and also suspected outpouching in the left PCA region which may represent an aneurysm. Patient desiredendovascular treatmentof RICA and possible left PCOM aneurysms at the same time as her diagnostic arteriogram. This was initially planned for 04/07/21; however P2y12 was low, so only 4V cerebral arteriogram done. 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 - Prominent high-riding right  internal jugular bulb measuring 14.3 mm x 10.5 mm projecting superolaterally.  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 - Patient also advised to reduce her Plavix to 37.5 mg once a day, and continue with aspirin 325 mg a day.    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   Last visit with EP Dr. Lovena Le 01/21/2021. She was pacing approximately 30% of the time. Her Biotronik DDD PPM was working normally. Interrogation did showup to a minute of AT/AF which will be monitored for further episodes.   Last cardiology visit with Dr. Geraldo Pitter 02/26/2021. 75-monthfollow-up plan.  She isa same day work-up, so labs as indicated and anesthesia team evaluation on the day of surgery. Still awaiting perioperative cardiac device instructions for her Biotronik PPM.    VS:   BP Readings from Last 3 Encounters: 04/07/21 113/72 04/07/21 (!) 150/78 03/27/21 124/66   Pulse Readings from Last 3 Encounters: 04/07/21 69 04/07/21 77 03/27/21 81    PROVIDERS: HRip Harbour NPis PCP ([facility]) RJyl Heinz MD is cardiologist TCristopher Peru MD is EP JMetta Clines DO is neurologist  LABS: For day of procedure as indicated. Currently last p2y12 88 on 04/10/21 and other most recent results include:  Recent Labs Lab Results Component Value Date  WBC 9.7 04/07/2021  HGB 14.0 04/07/2021  HCT 43.0  04/07/2021  PLT 296 04/07/2021  GLUCOSE 101  (H) 04/07/2021  CHOL 154 03/27/2021  TRIG 87 03/27/2021  HDL 49 03/27/2021  LDLCALC 89 03/27/2021  ALT 10 03/27/2021  AST 17 03/27/2021  NA 140 04/07/2021  K 3.9 04/07/2021  CL 107 04/07/2021  CREATININE 0.89 04/07/2021  BUN 15 04/07/2021  CO2 26 04/07/2021  TSH 1.220 09/13/2020  INR 1.0 04/07/2021     IMAGES: CXR 10/21/20: FINDINGS: Interval placement of a left-sided pacemaker with leads overlying the right atrial appendage and right ventricle. The heart size and mediastinal contours are within normal limits. Aortic atherosclerosis. No focal consolidation. No visible pneumothorax. No pleural effusion. The visualized skeletal structures are unremarkable. IMPRESSION: Interval placement of a left-sided pacemaker. No visible pneumothorax.   EKG:01/21/21 ([facility]): NSR with atrial pacing   CV: EP Procedure 10/21/20: CONCLUSIONS: 1. Successful implantation of a Biotronik dual-chamber pacemaker for symptomatic bradycardia due to CHB 2. No early apparent complications.   Echo 10/14/20: IMPRESSIONS  1. Left ventricular ejection fraction, by estimation, is 60 to 65%. The  left ventricle has normal function. The left ventricle has no regional  wall motion abnormalities. Left ventricular diastolic parameters were  normal.  2. Right ventricular systolic function is normal. The right ventricular  size is normal. There is normal pulmonary artery systolic pressure.  3. The mitral valve is normal in structure. No evidence of mitral valve  regurgitation. No evidence of mitral stenosis.  4. The aortic valve is tricuspid. Aortic valve regurgitation is not  visualized. No aortic stenosis is present.  5. The inferior vena cava is normal in size with greater than 50%  respiratory variability, suggesting right atrial pressure of 3 mmHg.    Long term monitor 09/18/20-10/02/20: Study Highlights -Patient had a min HR of 26 bpm, max HR of 145 bpm,  and avg HR of 84 bpm. -Predominant underlying rhythm was Sinus Rhythm. 1 run of Supraventricular Tachycardia occurred lasting 7 beats with a max rate of 102 bpm (avg 98 bpm). 1 Pause occurred lasting 4.6 secs (13 bpm). Pause Occurred due to High Grade AV Block. 3 episode(s) of AV Block (High Grade) occurred, lasting a total of 16 secs. Isolated SVEs were rare (<1.0%), SVE Couplets were rare (<1.0%), and SVE Triplets were rare (<1.0%). Isolated VEs were rare (<1.0%), and no VE Couplets or VE Triplets were present. Difficulty discerning atrial activity at times making definitive diagnosis difficult to ascertain. -MD notification criteria for High Grade AV Block (4.6 seconds of Ventricular Asystole) met - report posted prior to notification per account request (ahs).   Nuclear stress test 09/24/20: ? Nuclear stress EF: 64%. ? There was no ST segment deviation noted during stress. ? This is a low risk study. ? The left ventricular ejection fraction is normal (55-65%).        Reproductive/Obstetrics                                                            Anesthesia Evaluation  Patient identified by MRN, date of birth, ID band Patient awake    Reviewed: Allergy & Precautions, NPO status , Patient's Chart, lab work & pertinent test results  Airway Mallampati: II  TM Distance: >3 FB Neck ROM: Full    Dental  (+) Edentulous Upper, Edentulous Lower   Pulmonary  COPD,  COPD inhaler, former smoker,  Former smoker 41 pack year history    Pulmonary exam normal breath sounds clear to auscultation       Cardiovascular + Peripheral Vascular Disease  Normal cardiovascular exam+ dysrhythmias + pacemaker (10/2020 for CHB)  Rhythm:Regular Rate:Normal  Echo 10/2020: 1. Left ventricular ejection fraction, by estimation, is 60 to 65%. The  left ventricle has normal function. The left ventricle has no regional  wall motion abnormalities. Left ventricular  diastolic parameters were  normal.  2. Right ventricular systolic function is normal. The right ventricular  size is normal. There is normal pulmonary artery systolic pressure.  3. The mitral valve is normal in structure. No evidence of mitral valve  regurgitation. No evidence of mitral stenosis.  4. The aortic valve is tricuspid. Aortic valve regurgitation is not  visualized. No aortic stenosis is present.  5. The inferior vena cava is normal in size with greater than 50%  respiratory variability, suggesting right atrial pressure of 3 mmHg.   Stress test: ? Nuclear stress EF: 64%. ? There was no ST segment deviation noted during stress. ? This is a low risk study. ? The left ventricular ejection fraction is normal (55-65%).     Neuro/Psych PSYCHIATRIC DISORDERS Anxiety Depression large right paraophthalmic region aneurysm and also suspected outpouching in the left PCA region which may represent an aneurysm. CVA    GI/Hepatic negative GI ROS, Neg liver ROS,   Endo/Other  negative endocrine ROS  Renal/GU negative Renal ROS  negative genitourinary   Musculoskeletal negative musculoskeletal ROS (+)   Abdominal   Peds  Hematology negative hematology ROS (+)   Anesthesia Other Findings   Reproductive/Obstetrics negative OB ROS                         Anesthesia Physical Anesthesia Plan  ASA: 3  Anesthesia Plan: MAC   Post-op Pain Management:    Induction:   PONV Risk Score and Plan: Treatment may vary due to age or medical condition, Propofol infusion, TIVA and Midazolam  Airway Management Planned: Natural Airway and Simple Face Mask  Additional Equipment: None  Intra-op Plan:   Post-operative Plan:   Informed Consent: I have reviewed the patients History and Physical, chart, labs and discussed the procedure including the risks, benefits and alternatives for the proposed anesthesia with the patient or authorized representative who  has indicated his/her understanding and acceptance.     Dental advisory given  Plan Discussed with: CRNA and Anesthesiologist  Anesthesia Plan Comments: (Low p2y12 this morning- per Dr. Estanislado Pandy will likely only do angiogram today. Will proceed w/ sedation and convert to GA/place arterial line if necessary ' Per rep, magnet available for PPM)   Anesthesia Quick Evaluation  Anesthesia Physical Anesthesia Plan  ASA: 3  Anesthesia Plan: General   Post-op Pain Management:    Induction: Intravenous  PONV Risk Score and Plan: 3 and Ondansetron, Dexamethasone and Midazolam  Airway Management Planned: Oral ETT  Additional Equipment: Arterial line  Intra-op Plan:   Post-operative Plan: Extubation in OR  Informed Consent: I have reviewed the patients History and Physical, chart, labs and discussed the procedure including the risks, benefits and alternatives for the proposed anesthesia with the patient or authorized representative who has indicated his/her understanding and acceptance.     Dental advisory given  Plan Discussed with: CRNA  Anesthesia Plan Comments: (PAT note written 04/18/2021 by Myra Gianotti, PA-C. )  Anesthesia Quick Evaluation                                  Anesthesia Evaluation  Patient identified by MRN, date of birth, ID band Patient awake    Reviewed: Allergy & Precautions, NPO status , Patient's Chart, lab work & pertinent test results  Airway Mallampati: II  TM Distance: >3 FB Neck ROM: Full    Dental  (+) Edentulous Upper, Edentulous Lower   Pulmonary COPD,  COPD inhaler, former smoker,  Former smoker 41 pack year history    Pulmonary exam normal breath sounds clear to auscultation       Cardiovascular + Peripheral Vascular Disease  Normal cardiovascular exam+ dysrhythmias + pacemaker (10/2020 for CHB)  Rhythm:Regular Rate:Normal  Echo 10/2020: 1. Left ventricular ejection fraction, by estimation, is 60 to  65%. The  left ventricle has normal function. The left ventricle has no regional  wall motion abnormalities. Left ventricular diastolic parameters were  normal.  2. Right ventricular systolic function is normal. The right ventricular  size is normal. There is normal pulmonary artery systolic pressure.  3. The mitral valve is normal in structure. No evidence of mitral valve  regurgitation. No evidence of mitral stenosis.  4. The aortic valve is tricuspid. Aortic valve regurgitation is not  visualized. No aortic stenosis is present.  5. The inferior vena cava is normal in size with greater than 50%  respiratory variability, suggesting right atrial pressure of 3 mmHg.   Stress test: ? Nuclear stress EF: 64%. ? There was no ST segment deviation noted during stress. ? This is a low risk study. ? The left ventricular ejection fraction is normal (55-65%).     Neuro/Psych PSYCHIATRIC DISORDERS Anxiety Depression large right paraophthalmic region aneurysm and also suspected outpouching in the left PCA region which may represent an aneurysm. CVA    GI/Hepatic negative GI ROS, Neg liver ROS,   Endo/Other  negative endocrine ROS  Renal/GU negative Renal ROS  negative genitourinary   Musculoskeletal negative musculoskeletal ROS (+)   Abdominal   Peds  Hematology negative hematology ROS (+)   Anesthesia Other Findings   Reproductive/Obstetrics negative OB ROS                         Anesthesia Physical Anesthesia Plan  ASA: 3  Anesthesia Plan: MAC   Post-op Pain Management:    Induction:   PONV Risk Score and Plan: Treatment may vary due to age or medical condition, Propofol infusion, TIVA and Midazolam  Airway Management Planned: Natural Airway and Simple Face Mask  Additional Equipment: None  Intra-op Plan:   Post-operative Plan:   Informed Consent: I have reviewed the patients History and Physical, chart, labs and discussed the procedure  including the risks, benefits and alternatives for the proposed anesthesia with the patient or authorized representative who has indicated his/her understanding and acceptance.     Dental advisory given  Plan Discussed with: CRNA and Anesthesiologist  Anesthesia Plan Comments: (Low p2y12 this morning- per Dr. Estanislado Pandy will likely only do angiogram today. Will proceed w/ sedation and convert to GA/place arterial line if necessary ' Per rep, magnet available for PPM)   Anesthesia Quick Evaluation  Anesthesia Evaluation  Patient identified by MRN, date of birth, ID band Patient awake    Reviewed: Allergy & Precautions, NPO status , Patient's Chart, lab work & pertinent test results  Airway Mallampati: II  TM Distance: >3 FB Neck ROM: Full    Dental  (+) Edentulous Upper, Edentulous Lower   Pulmonary COPD,  COPD inhaler, former smoker,  Former smoker 41 pack year history    Pulmonary exam normal breath sounds clear to auscultation       Cardiovascular + Peripheral Vascular Disease  Normal cardiovascular exam+ dysrhythmias + pacemaker (10/2020 for CHB)  Rhythm:Regular Rate:Normal  Echo 10/2020: 1. Left ventricular ejection fraction, by estimation, is 60 to 65%. The  left ventricle has normal function. The left ventricle has no regional  wall motion abnormalities. Left ventricular diastolic parameters were  normal.  2. Right ventricular systolic function is normal. The right ventricular  size is normal. There is normal pulmonary artery systolic pressure.  3. The mitral valve is normal in structure. No evidence of mitral valve  regurgitation. No evidence of mitral stenosis.  4. The aortic valve is tricuspid. Aortic valve regurgitation is not  visualized. No aortic stenosis is present.  5. The inferior vena cava is normal in size with greater than 50%  respiratory variability, suggesting right atrial pressure of 3 mmHg.    Stress test: ? Nuclear stress EF: 64%. ? There was no ST segment deviation noted during stress. ? This is a low risk study. ? The left ventricular ejection fraction is normal (55-65%).     Neuro/Psych PSYCHIATRIC DISORDERS Anxiety Depression large right paraophthalmic region aneurysm and also suspected outpouching in the left PCA region which may represent an aneurysm. CVA    GI/Hepatic negative GI ROS, Neg liver ROS,   Endo/Other  negative endocrine ROS  Renal/GU negative Renal ROS  negative genitourinary   Musculoskeletal negative musculoskeletal ROS (+)   Abdominal   Peds  Hematology negative hematology ROS (+)   Anesthesia Other Findings   Reproductive/Obstetrics negative OB ROS                         Anesthesia Physical Anesthesia Plan  ASA: 3  Anesthesia Plan: MAC   Post-op Pain Management:    Induction:   PONV Risk Score and Plan: Treatment may vary due to age or medical condition, Propofol infusion, TIVA and Midazolam  Airway Management Planned: Natural Airway and Simple Face Mask  Additional Equipment: None  Intra-op Plan:   Post-operative Plan:   Informed Consent: I have reviewed the patients History and Physical, chart, labs and discussed the procedure including the risks, benefits and alternatives for the proposed anesthesia with the patient or authorized representative who has indicated his/her understanding and acceptance.     Dental advisory given  Plan Discussed with: CRNA and Anesthesiologist  Anesthesia Plan Comments: (Low p2y12 this morning- per Dr. Estanislado Pandy will likely only do angiogram today. Will proceed w/ sedation and convert to GA/place arterial line if necessary ' Per rep, magnet available for PPM)   Anesthesia Quick Evaluation

## 2021-04-21 ENCOUNTER — Ambulatory Visit (HOSPITAL_COMMUNITY): Payer: 59 | Admitting: Vascular Surgery

## 2021-04-21 ENCOUNTER — Encounter (HOSPITAL_COMMUNITY): Payer: Self-pay | Admitting: Interventional Radiology

## 2021-04-21 ENCOUNTER — Other Ambulatory Visit: Payer: Self-pay

## 2021-04-21 ENCOUNTER — Encounter (HOSPITAL_COMMUNITY)
Admission: AD | Disposition: A | Discharge status: Home / Self Care | Payer: Self-pay | Source: Home / Self Care | Attending: Interventional Radiology

## 2021-04-21 ENCOUNTER — Inpatient Hospital Stay (HOSPITAL_COMMUNITY)
Admission: AD | Admit: 2021-04-21 | Discharge: 2021-04-22 | DRG: 026 | Disposition: A | Discharge status: Home / Self Care | Payer: 59 | Attending: Interventional Radiology | Admitting: Interventional Radiology

## 2021-04-21 ENCOUNTER — Ambulatory Visit (HOSPITAL_COMMUNITY)
Admission: RE | Admit: 2021-04-21 | Discharge: 2021-04-21 | Disposition: A | Discharge status: Home / Self Care | Payer: 59 | Source: Ambulatory Visit | Attending: Interventional Radiology | Admitting: Interventional Radiology

## 2021-04-21 ENCOUNTER — Ambulatory Visit (INDEPENDENT_AMBULATORY_CARE_PROVIDER_SITE_OTHER): Payer: 59

## 2021-04-21 ENCOUNTER — Encounter (HOSPITAL_COMMUNITY): Payer: Self-pay

## 2021-04-21 DIAGNOSIS — Z79899 Other long term (current) drug therapy: Secondary | ICD-10-CM | POA: Insufficient documentation

## 2021-04-21 DIAGNOSIS — Z7982 Long term (current) use of aspirin: Secondary | ICD-10-CM

## 2021-04-21 DIAGNOSIS — Z87891 Personal history of nicotine dependence: Secondary | ICD-10-CM

## 2021-04-21 DIAGNOSIS — Z8249 Family history of ischemic heart disease and other diseases of the circulatory system: Secondary | ICD-10-CM

## 2021-04-21 DIAGNOSIS — I739 Peripheral vascular disease, unspecified: Secondary | ICD-10-CM | POA: Diagnosis present

## 2021-04-21 DIAGNOSIS — I442 Atrioventricular block, complete: Secondary | ICD-10-CM | POA: Diagnosis present

## 2021-04-21 DIAGNOSIS — I729 Aneurysm of unspecified site: Secondary | ICD-10-CM | POA: Diagnosis present

## 2021-04-21 DIAGNOSIS — I671 Cerebral aneurysm, nonruptured: Principal | ICD-10-CM | POA: Diagnosis present

## 2021-04-21 DIAGNOSIS — I6521 Occlusion and stenosis of right carotid artery: Secondary | ICD-10-CM | POA: Diagnosis present

## 2021-04-21 DIAGNOSIS — R58 Hemorrhage, not elsewhere classified: Secondary | ICD-10-CM | POA: Diagnosis not present

## 2021-04-21 DIAGNOSIS — Z23 Encounter for immunization: Secondary | ICD-10-CM

## 2021-04-21 DIAGNOSIS — Z823 Family history of stroke: Secondary | ICD-10-CM

## 2021-04-21 DIAGNOSIS — F32A Depression, unspecified: Secondary | ICD-10-CM | POA: Diagnosis not present

## 2021-04-21 DIAGNOSIS — Z825 Family history of asthma and other chronic lower respiratory diseases: Secondary | ICD-10-CM | POA: Diagnosis not present

## 2021-04-21 DIAGNOSIS — Z833 Family history of diabetes mellitus: Secondary | ICD-10-CM

## 2021-04-21 DIAGNOSIS — Z950 Presence of cardiac pacemaker: Secondary | ICD-10-CM | POA: Diagnosis not present

## 2021-04-21 DIAGNOSIS — E785 Hyperlipidemia, unspecified: Secondary | ICD-10-CM | POA: Diagnosis not present

## 2021-04-21 DIAGNOSIS — J449 Chronic obstructive pulmonary disease, unspecified: Secondary | ICD-10-CM | POA: Diagnosis present

## 2021-04-21 DIAGNOSIS — F419 Anxiety disorder, unspecified: Secondary | ICD-10-CM | POA: Diagnosis present

## 2021-04-21 DIAGNOSIS — Z7902 Long term (current) use of antithrombotics/antiplatelets: Secondary | ICD-10-CM | POA: Insufficient documentation

## 2021-04-21 DIAGNOSIS — Z8673 Personal history of transient ischemic attack (TIA), and cerebral infarction without residual deficits: Secondary | ICD-10-CM

## 2021-04-21 HISTORY — PX: RADIOLOGY WITH ANESTHESIA: SHX6223

## 2021-04-21 HISTORY — DX: Aneurysm of unspecified site: I72.9

## 2021-04-21 HISTORY — PX: IR ANGIO INTRA EXTRACRAN SEL INTERNAL CAROTID UNI R MOD SED: IMG5362

## 2021-04-21 HISTORY — PX: IR CT HEAD LTD: IMG2386

## 2021-04-21 HISTORY — DX: Cerebral aneurysm, nonruptured: I67.1

## 2021-04-21 HISTORY — PX: IR ANGIOGRAM FOLLOW UP STUDY: IMG697

## 2021-04-21 HISTORY — PX: IR TRANSCATH/EMBOLIZ: IMG695

## 2021-04-21 LAB — GLUCOSE, CAPILLARY
Glucose-Capillary: 116 mg/dL — ABNORMAL HIGH (ref 70–99)
Glucose-Capillary: 119 mg/dL — ABNORMAL HIGH (ref 70–99)

## 2021-04-21 LAB — MRSA NEXT GEN BY PCR, NASAL: MRSA by PCR Next Gen: NOT DETECTED

## 2021-04-21 LAB — CBC WITH DIFFERENTIAL/PLATELET
Abs Immature Granulocytes: 0.02 10*3/uL (ref 0.00–0.07)
Basophils Absolute: 0.1 10*3/uL (ref 0.0–0.1)
Basophils Relative: 1 %
Eosinophils Absolute: 0.3 10*3/uL (ref 0.0–0.5)
Eosinophils Relative: 4 %
HCT: 44.8 % (ref 36.0–46.0)
Hemoglobin: 14.6 g/dL (ref 12.0–15.0)
Immature Granulocytes: 0 %
Lymphocytes Relative: 30 %
Lymphs Abs: 2.5 10*3/uL (ref 0.7–4.0)
MCH: 29.1 pg (ref 26.0–34.0)
MCHC: 32.6 g/dL (ref 30.0–36.0)
MCV: 89.2 fL (ref 80.0–100.0)
Monocytes Absolute: 0.5 10*3/uL (ref 0.1–1.0)
Monocytes Relative: 7 %
Neutro Abs: 4.9 10*3/uL (ref 1.7–7.7)
Neutrophils Relative %: 58 %
Platelets: 265 10*3/uL (ref 150–400)
RBC: 5.02 MIL/uL (ref 3.87–5.11)
RDW: 12.8 % (ref 11.5–15.5)
WBC: 8.3 10*3/uL (ref 4.0–10.5)
nRBC: 0 % (ref 0.0–0.2)

## 2021-04-21 LAB — BASIC METABOLIC PANEL
Anion gap: 7 (ref 5–15)
BUN: 13 mg/dL (ref 6–20)
CO2: 25 mmol/L (ref 22–32)
Calcium: 9.4 mg/dL (ref 8.9–10.3)
Chloride: 107 mmol/L (ref 98–111)
Creatinine, Ser: 1.07 mg/dL — ABNORMAL HIGH (ref 0.44–1.00)
GFR, Estimated: >60 mL/min (ref >60)
Glucose, Bld: 110 mg/dL — ABNORMAL HIGH (ref 70–99)
Potassium: 4.8 mmol/L (ref 3.5–5.1)
Sodium: 139 mmol/L (ref 135–145)

## 2021-04-21 LAB — CUP PACEART REMOTE DEVICE CHECK
Date Time Interrogation Session: 20220822155202
Implantable Lead Implant Date: 20220221
Implantable Lead Implant Date: 20220221
Implantable Lead Location: 753859
Implantable Lead Location: 753860
Implantable Lead Model: 377171
Implantable Lead Model: 377171
Implantable Lead Serial Number: 8000187789
Implantable Lead Serial Number: 8000197459
Implantable Pulse Generator Implant Date: 20220221
Pulse Gen Model: 407145
Pulse Gen Serial Number: 70053960

## 2021-04-21 LAB — PROTIME-INR
INR: 1 (ref 0.8–1.2)
Prothrombin Time: 12.8 seconds (ref 11.4–15.2)

## 2021-04-21 LAB — POCT ACTIVATED CLOTTING TIME
Activated Clotting Time: 254 seconds
Activated Clotting Time: 260 seconds

## 2021-04-21 LAB — HEPARIN LEVEL (UNFRACTIONATED): Heparin Unfractionated: 0.21 IU/mL — ABNORMAL LOW (ref 0.30–0.70)

## 2021-04-21 SURGERY — RADIOLOGY WITH ANESTHESIA
Anesthesia: General

## 2021-04-21 MED ORDER — HYDROXYZINE HCL 25 MG PO TABS
25.0000 mg | ORAL_TABLET | Freq: Three times a day (TID) | ORAL | Status: DC | PRN
  Filled 2021-04-21: qty 1

## 2021-04-21 MED ORDER — CLOPIDOGREL BISULFATE 75 MG PO TABS
75.0000 mg | ORAL_TABLET | Freq: Every day | ORAL | Status: DC
  Administered 2021-04-22: 75 mg via ORAL
  Filled 2021-04-21: qty 1

## 2021-04-21 MED ORDER — ORAL CARE MOUTH RINSE: 15.0000 mL | Freq: Once | OROMUCOSAL | Status: DC

## 2021-04-21 MED ORDER — NITROGLYCERIN 1 MG/10 ML FOR IR/CATH LAB
INTRA_ARTERIAL | Status: AC
  Filled 2021-04-21: qty 10

## 2021-04-21 MED ORDER — CHLORHEXIDINE GLUCONATE CLOTH 2 % EX PADS
6.0000 | MEDICATED_PAD | Freq: Every day | CUTANEOUS | Status: DC
  Administered 2021-04-21: 6 via TOPICAL

## 2021-04-21 MED ORDER — IOHEXOL 240 MG/ML SOLN
50.0000 mL | Freq: Once | INTRAMUSCULAR | Status: AC | PRN
  Administered 2021-04-21: 10 mL via INTRA_ARTERIAL

## 2021-04-21 MED ORDER — FENTANYL CITRATE (PF) 100 MCG/2ML IJ SOLN: 25.0000 ug | INTRAMUSCULAR | Status: DC | PRN

## 2021-04-21 MED ORDER — SERTRALINE HCL 50 MG PO TABS: 50.0000 mg | ORAL_TABLET | Freq: Every day | ORAL | Status: DC

## 2021-04-21 MED ORDER — PROPOFOL 10 MG/ML IV BOLUS
INTRAVENOUS | Status: DC | PRN
  Administered 2021-04-21: 140 mg via INTRAVENOUS

## 2021-04-21 MED ORDER — ONDANSETRON HCL 4 MG/2ML IJ SOLN
INTRAMUSCULAR | Status: DC | PRN
  Administered 2021-04-21: 4 mg via INTRAVENOUS

## 2021-04-21 MED ORDER — ASPIRIN EC 325 MG PO TBEC
325.0000 mg | DELAYED_RELEASE_TABLET | ORAL | Status: DC
  Filled 2021-04-21: qty 1

## 2021-04-21 MED ORDER — ACETAMINOPHEN 160 MG/5ML PO SOLN: 650.0000 mg | ORAL | Status: DC | PRN

## 2021-04-21 MED ORDER — CHLORHEXIDINE GLUCONATE 0.12 % MT SOLN
15.0000 mL | Freq: Once | OROMUCOSAL | Status: DC
  Filled 2021-04-21: qty 15

## 2021-04-21 MED ORDER — DEXAMETHASONE SODIUM PHOSPHATE 10 MG/ML IJ SOLN
INTRAMUSCULAR | Status: DC | PRN
  Administered 2021-04-21: 10 mg via INTRAVENOUS

## 2021-04-21 MED ORDER — CEFAZOLIN SODIUM-DEXTROSE 2-4 GM/100ML-% IV SOLN
2.0000 g | INTRAVENOUS | Status: AC
  Administered 2021-04-21: 2 g via INTRAVENOUS
  Filled 2021-04-21 (×2): qty 100

## 2021-04-21 MED ORDER — PNEUMOCOCCAL VAC POLYVALENT 25 MCG/0.5ML IJ INJ
0.5000 mL | INJECTION | INTRAMUSCULAR | Status: AC
  Administered 2021-04-22: 0.5 mL via INTRAMUSCULAR
  Filled 2021-04-21: qty 0.5

## 2021-04-21 MED ORDER — ACETAMINOPHEN 650 MG RE SUPP: 650.0000 mg | RECTAL | Status: DC | PRN

## 2021-04-21 MED ORDER — HEPARIN (PORCINE) 25000 UT/250ML-% IV SOLN
500.0000 [IU]/h | INTRAVENOUS | Status: DC
  Administered 2021-04-21: 500 [IU]/h via INTRAVENOUS
  Filled 2021-04-21: qty 250

## 2021-04-21 MED ORDER — FENTANYL CITRATE (PF) 250 MCG/5ML IJ SOLN: INTRAMUSCULAR | Status: DC | PRN

## 2021-04-21 MED ORDER — FENTANYL CITRATE (PF) 100 MCG/2ML IJ SOLN
INTRAMUSCULAR | Status: DC | PRN
  Administered 2021-04-21 (×2): 50 ug via INTRAVENOUS
  Administered 2021-04-21: 100 ug via INTRAVENOUS

## 2021-04-21 MED ORDER — SUGAMMADEX SODIUM 200 MG/2ML IV SOLN
INTRAVENOUS | Status: DC | PRN
  Administered 2021-04-21: 150 mg via INTRAVENOUS

## 2021-04-21 MED ORDER — SODIUM CHLORIDE 0.9 % IV SOLN: INTRAVENOUS | Status: DC

## 2021-04-21 MED ORDER — HEPARIN (PORCINE) 25000 UT/250ML-% IV SOLN
400.0000 [IU]/h | INTRAVENOUS | Status: DC
  Filled 2021-04-21: qty 250

## 2021-04-21 MED ORDER — ACETAMINOPHEN 325 MG PO TABS
650.0000 mg | ORAL_TABLET | ORAL | Status: DC | PRN
  Administered 2021-04-22: 650 mg via ORAL
  Filled 2021-04-21: qty 2

## 2021-04-21 MED ORDER — HEPARIN SODIUM (PORCINE) 1000 UNIT/ML IJ SOLN
INTRAMUSCULAR | Status: DC | PRN
  Administered 2021-04-21: 3000 [IU] via INTRAVENOUS

## 2021-04-21 MED ORDER — REVEFENACIN 175 MCG/3ML IN SOLN
175.0000 ug | Freq: Every day | RESPIRATORY_TRACT | Status: DC
  Filled 2021-04-21 (×2): qty 3

## 2021-04-21 MED ORDER — PHENYLEPHRINE HCL-NACL 20-0.9 MG/250ML-% IV SOLN
INTRAVENOUS | Status: DC | PRN
  Administered 2021-04-21: 50 ug/min via INTRAVENOUS

## 2021-04-21 MED ORDER — NIMODIPINE 30 MG PO CAPS
0.0000 mg | ORAL_CAPSULE | ORAL | Status: DC
  Filled 2021-04-21: qty 1

## 2021-04-21 MED ORDER — CLOPIDOGREL BISULFATE 75 MG PO TABS: 75.0000 mg | ORAL_TABLET | Freq: Every day | ORAL | Status: DC

## 2021-04-21 MED ORDER — ASPIRIN 81 MG PO CHEW: 81.0000 mg | CHEWABLE_TABLET | Freq: Every day | ORAL | Status: DC

## 2021-04-21 MED ORDER — SERTRALINE HCL 25 MG PO TABS
75.0000 mg | ORAL_TABLET | Freq: Every day | ORAL | Status: DC
  Filled 2021-04-21: qty 1.5
  Filled 2021-04-21: qty 3

## 2021-04-21 MED ORDER — ASPIRIN 81 MG PO CHEW
81.0000 mg | CHEWABLE_TABLET | Freq: Every day | ORAL | Status: DC
  Administered 2021-04-22: 81 mg via ORAL
  Filled 2021-04-21: qty 1

## 2021-04-21 MED ORDER — PHENYLEPHRINE 40 MCG/ML (10ML) SYRINGE FOR IV PUSH (FOR BLOOD PRESSURE SUPPORT)
PREFILLED_SYRINGE | INTRAVENOUS | Status: DC | PRN
  Administered 2021-04-21 (×2): 120 ug via INTRAVENOUS
  Administered 2021-04-21: 80 ug via INTRAVENOUS
  Administered 2021-04-21: 120 ug via INTRAVENOUS

## 2021-04-21 MED ORDER — ESMOLOL HCL 100 MG/10ML IV SOLN
INTRAVENOUS | Status: DC | PRN
  Administered 2021-04-21: 30 mg via INTRAVENOUS

## 2021-04-21 MED ORDER — PROMETHAZINE HCL 25 MG/ML IJ SOLN: 6.2500 mg | INTRAMUSCULAR | Status: DC | PRN

## 2021-04-21 MED ORDER — LIDOCAINE 2% (20 MG/ML) 5 ML SYRINGE
INTRAMUSCULAR | Status: DC | PRN
  Administered 2021-04-21: 100 mg via INTRAVENOUS

## 2021-04-21 MED ORDER — SERTRALINE HCL 50 MG PO TABS: 25.0000 mg | ORAL_TABLET | Freq: Every day | ORAL | Status: DC

## 2021-04-21 MED ORDER — SODIUM CHLORIDE 0.9 % IV SOLN
INTRAVENOUS | Status: DC
Start: 2021-04-21 — End: 2021-04-22

## 2021-04-21 MED ORDER — IOHEXOL 240 MG/ML SOLN
150.0000 mL | Freq: Once | INTRAMUSCULAR | Status: AC | PRN
  Administered 2021-04-21: 150 mL via INTRAVENOUS

## 2021-04-21 MED ORDER — BUDESON-GLYCOPYRROL-FORMOTEROL 160-9-4.8 MCG/ACT IN AERO: 2.0000 | INHALATION_SPRAY | Freq: Two times a day (BID) | RESPIRATORY_TRACT | Status: DC

## 2021-04-21 MED ORDER — ROCURONIUM BROMIDE 10 MG/ML (PF) SYRINGE
PREFILLED_SYRINGE | INTRAVENOUS | Status: DC | PRN
  Administered 2021-04-21: 60 mg via INTRAVENOUS
  Administered 2021-04-21: 20 mg via INTRAVENOUS

## 2021-04-21 MED ORDER — CLEVIDIPINE BUTYRATE 0.5 MG/ML IV EMUL
0.0000 mg/h | INTRAVENOUS | Status: DC
  Administered 2021-04-22: 1 mg/h via INTRAVENOUS
  Filled 2021-04-21: qty 50

## 2021-04-21 MED ORDER — LACTATED RINGERS IV SOLN: INTRAVENOUS | Status: DC | PRN

## 2021-04-21 MED ORDER — CLOPIDOGREL BISULFATE 75 MG PO TABS
75.0000 mg | ORAL_TABLET | ORAL | Status: AC
  Administered 2021-04-21: 75 mg via ORAL
  Filled 2021-04-21: qty 1

## 2021-04-21 MED ORDER — HEPARIN (PORCINE) 25000 UT/250ML-% IV SOLN
INTRAVENOUS | Status: AC
  Filled 2021-04-21: qty 250

## 2021-04-21 MED ORDER — ACETAMINOPHEN 10 MG/ML IV SOLN: 1000.0000 mg | Freq: Once | INTRAVENOUS | Status: DC | PRN

## 2021-04-21 MED ORDER — MOMETASONE FURO-FORMOTEROL FUM 200-5 MCG/ACT IN AERO
2.0000 | INHALATION_SPRAY | Freq: Two times a day (BID) | RESPIRATORY_TRACT | Status: DC
  Administered 2021-04-21: 2 via RESPIRATORY_TRACT
  Filled 2021-04-21: qty 8.8

## 2021-04-21 NOTE — Anesthesia Procedure Notes (Signed)

## 2021-04-21 NOTE — Progress Notes (Signed)
Referring Physician(s): Jaffe,A  Supervising Physician: Julieanne Cotton  Patient Status:  [facility] - In-pt  Chief Complaint: Right ICA para ophthalmic aneurysm  Subjective: Pt resting quietly in bed; daughter in room; main c/o is some "fullness" both eyes with increased sensitivity to light; pt wears glasses but states she needs new prescription, vision still impaired; also with likely cataracts; denies HA,N/V; had some bleeding from rt groin site earlier which has since resolved; clean, gauze dressing in place; otherwise alert/oriented, FC   Allergies: Patient has no known allergies.  Medications: Prior to Admission medications   Medication Sig Start Date End Date Taking? Authorizing Provider  aspirin 81 MG chewable tablet Chew 81 mg by mouth daily.   Yes [provider]  Budeson-Glycopyrrol-Formoterol (BREZTRI AEROSPHERE) 160-9-4.8 MCG/ACT AERO Inhale 2 puffs into the lungs 2 (two) times daily. 03/27/21  Yes [name], NP  clopidogrel (PLAVIX) 75 MG tablet Take 75 mg by mouth daily.   Yes [provider]  hydrOXYzine (ATARAX/VISTARIL) 25 MG tablet Take 1 tablet (25 mg total) by mouth 3 (three) times daily as needed for anxiety. 01/14/21  Yes [name], NP  rosuvastatin (CRESTOR) 20 MG tablet Take 1 tablet (20 mg total) by mouth daily. 03/27/21 06/25/21 Yes [name], NP  sertraline (ZOLOFT) 25 MG tablet Take 1 tablet (25 mg total) by mouth daily. 03/27/21  Yes [name], NP  sertraline (ZOLOFT) 50 MG tablet Take 1 tablet (50 mg total) by mouth daily. 03/27/21  Yes [name], NP  traZODone (DESYREL) 50 MG tablet Take 1 tablet (50 mg total) by mouth at bedtime. Patient not taking: Reported on 04/02/2021 09/12/20   Wandra Feinstein, MD     Vital Signs: BP 123/65   Pulse 65   Temp 97.8 F (36.6 C)   Resp (!) 21   Ht 5\' 4"  (1.626 m)   Wt 115 lb (52.2 kg)   SpO2 96%   BMI 19.74 kg/m   Physical Exam pt awake/alert; FC; speech  nl; face symm; tongue midline, moving all fours ok; strength nl ; rt groin access site currently soft, NT, clean, gauze dressing in place      Imaging: No results found.  Labs:  CBC: Recent Labs    12/25/20 0956 03/27/21 1000 04/07/21 0552 04/21/21 0608  WBC 8.3 9.4 9.7 8.3  HGB 15.8 15.1 14.0 14.6  HCT 46.9* 46.2 43.0 44.8  PLT 260 313 296 265    COAGS: Recent Labs    04/07/21 0552 04/21/21 0608  INR 1.0 1.0    BMP: Recent Labs    09/13/20 1205 10/18/20 1148 12/25/20 0956 03/27/21 1000 04/07/21 0552 04/21/21 0608  NA 137 138 142 144 140 139  K 4.6 4.3 4.4 5.1 3.9 4.8  CL 99 100 104 104 107 107  CO2 23 20 23 24 26 25   GLUCOSE 92 93 95 83 101* 110*  BUN 17 12 14 15 15 13   CALCIUM 9.7 9.7 9.9 9.7 9.3 9.4  CREATININE 1.03* 0.83 1.02* 0.91 0.89 1.07*  GFRNONAA 60 79  --   --  >60 >60  GFRAA 70 91  --   --   --   --     LIVER FUNCTION TESTS: Recent Labs    09/13/20 1205 12/25/20 0956 03/27/21 1000  BILITOT 0.5 0.4 <0.2  AST 19 27 17   ALT 12 20 10   ALKPHOS 111 118 122*  PROT 6.7 7.1 6.6  ALBUMIN 4.2 4.5  4.5    Assessment and Plan: Pt s/p rt common carotid arteriogram via rt CFA approach earlier today with  embolization of Rt  ICA  para ophthalmic aneurysm / pipeline shield flow diversion with coil assistance ; afebrile; BP 135/70; currently stable; had some recent bleeding from rt groin access site but appears to have resolved; IV heparin was stopped temporarily; plan to restart at 1545 if stable; rec that staff keep quick clot in room in case rebleeds; pt may have liquid diet/soup /jello if desired initially; pt to remain flat in bed for now; also on plavix/ASA; for overnight observation; pt to f/u with ophthalmologist after discharge to update glasses prescription/ eval cataracts   Electronically Signed: D. Jeananne Rama, PA-C 04/21/2021, 3:27 PM   I spent a total of 15 Minutes at the the patient's bedside AND on the patient's hospital floor or  unit, greater than 50% of which was counseling/coordinating care for rt common carotid arteriogram via rt CFA approach earlier today with  embolization of Rt  ICA  para ophthalmic aneurysm / pipeline shield flow diversion with coil assistance     Patient ID: Leslie Franklin, female   DOB: 04/01/1961, 60 y.o.   MRN: [id number]

## 2021-04-21 NOTE — Transfer of Care (Signed)
Immediate Anesthesia Transfer of Care Note  Patient: Leslie Franklin  Procedure(s) Performed: RADIOLOGY WITH ANESTHESIA EMBOLIZATION  Patient Location: PACU  Anesthesia Type:General  Level of Consciousness: awake, alert  and oriented  Airway & Oxygen Therapy: Patient Spontanous Breathing  Post-op Assessment: Report given to RN, Post -op Vital signs reviewed and stable, Patient moving all extremities X 4 and Patient able to stick tongue midline  Post vital signs: Reviewed and stable  Last Vitals:  Vitals Value Taken Time  BP 94/46 04/21/21 1153  Temp    Pulse 73 04/21/21 1156  Resp 11 04/21/21 1156  SpO2 98 % 04/21/21 1156  Vitals shown include unvalidated device data.  Last Pain:  Vitals:   04/21/21 0711  TempSrc:   PainSc: 0-No pain         Complications: No notable events documented.

## 2021-04-21 NOTE — Progress Notes (Signed)
Pt groin site oozing again at 1545 groin/pulse check. Called Dr. Corliss Skains. Verbal order to apply quick clot, hold pressure x 20 minutes, and restart heparin gtt at 1630.

## 2021-04-21 NOTE — Progress Notes (Signed)
Transported patient from PACU to 4N ICU, upon arrival to ICU patient's right groin site actively bleeding. Dr. Corliss Skains notified and states to hold pressure for 20-25 minutes and stop Heparin for 1 hour and resume.   [facility] ICU nurses present for the phone call with Dr. Corliss Skains.

## 2021-04-21 NOTE — Progress Notes (Signed)
Groin site oozing again at 1445. Held pressure until 1500 when hemostasis was achieved. Verbal order to hold heparin until 1600 per Dr. Corliss Skains.

## 2021-04-21 NOTE — Anesthesia Postprocedure Evaluation (Signed)
Anesthesia Post Note  Patient: Sera Hitsman  Procedure(s) Performed: RADIOLOGY WITH ANESTHESIA EMBOLIZATION     Patient location during evaluation: PACU Anesthesia Type: General Level of consciousness: awake Pain management: pain level controlled Vital Signs Assessment: post-procedure vital signs reviewed and stable Respiratory status: spontaneous breathing Cardiovascular status: stable Postop Assessment: no headache, no backache and no apparent nausea or vomiting Anesthetic complications: no   No notable events documented.  Last Vitals:  Vitals:   04/21/21 0805 04/21/21 1155  BP: 136/67 (!) 94/46  Pulse: 69 74  Resp:  12  Temp:  (!) 36.4 C  SpO2: 97% 97%    Last Pain:  Vitals:   04/21/21 1155  TempSrc:   PainSc: 0-No pain   Pain Goal: Patients Stated Pain Goal: 3 (04/21/21 1155)    LLE Sensation: Full sensation (04/21/21 1155) RLE Motor Response: Responds to commands, Purposeful movement (04/21/21 1155) RLE Sensation: Full sensation (04/21/21 1155)        Caren Macadam

## 2021-04-21 NOTE — Progress Notes (Signed)
ANTICOAGULATION CONSULT NOTE - Initial Consult  Pharmacy Consult for heparin Indication: s/p embolization   No Known Allergies  Patient Measurements: Height: 5\' 4"  (162.6 cm) Weight: 52.2 kg (115 lb) IBW/kg (Calculated) : 54.7 Heparin Dosing Weight: TBW  Vital Signs: Temp: 97.8 F (36.6 C) (08/22 1325) Temp Source: Oral (08/22 0602) BP: 111/64 (08/22 1325) Pulse Rate: 65 (08/22 1330)  Labs: Recent Labs    04/21/21 0608  HGB 14.6  HCT 44.8  PLT 265  LABPROT 12.8  INR 1.0  CREATININE 1.07*    Estimated Creatinine Clearance: 47.2 mL/min (A) (by C-G formula based on SCr of 1.07 mg/dL (H)).   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  She is not on anticoagulation PTA  Goal of Therapy:  Heparin level 0.1-0.25 units/ml Monitor platelets by anticoagulation protocol: Yes   Plan:  Continue heparin gtt at 500 units/hr F/u 6 hour heparin level F/u IR rounds in AM for LOT  10-21-1972, PharmD Clinical Pharmacist Please check AMION for all [facility] numbers 04/21/2021 1:51 PM

## 2021-04-21 NOTE — H&P (Deleted)
  The note originally documented on this encounter has been moved the the encounter in which it belongs.  

## 2021-04-21 NOTE — Progress Notes (Signed)
ANTICOAGULATION CONSULT NOTE  Pharmacy Consult for heparin Indication: s/p embolization   No Known Allergies  Patient Measurements: Height: 5\' 4"  (162.6 cm) Weight: 52.2 kg (115 lb) IBW/kg (Calculated) : 54.7 Heparin Dosing Weight: TBW  Vital Signs: Temp: 98.7 F (37.1 C) (08/22 2000) Temp Source: Oral (08/22 2000) BP: 131/52 (08/22 2000) Pulse Rate: 68 (08/22 2000)  Labs: Recent Labs    04/21/21 0608 04/21/21 1932  HGB 14.6  --   HCT 44.8  --   PLT 265  --   LABPROT 12.8  --   INR 1.0  --   HEPARINUNFRC  --  0.21*  CREATININE 1.07*  --      Estimated Creatinine Clearance: 47.2 mL/min (A) (by C-G formula based on SCr of 1.07 mg/dL (H)).  Assessment: 75 YOF presenting for embolization in IR, now s/p and for heparin gtt post-op, currently running at 500 units/hr upon transfer to ICU.    Heparin level is therapeutic despite holding for about an hour due to bleeding from right groin (it is a 3-hr level).  Bleeding has resolved per discussion with RN.  Goal of Therapy:  Heparin level 0.1-0.25 units/ml Monitor platelets by anticoagulation protocol: Yes   Plan:  Reduce heparin gtt to 400 units/hr F/u IR rounds in AM for LOT  Malaki Koury D. 10-07-1980, PharmD, BCPS, BCCCP 04/21/2021, 9:06 PM

## 2021-04-21 NOTE — Anesthesia Procedure Notes (Signed)
Arterial Line Insertion Start/End8/22/2022 7:15 AM, 04/21/2021 7:25 AM Performed by: Nils Pyle, CRNA, CRNA  Patient location: Pre-op. Preanesthetic checklist: patient identified, IV checked, site marked, risks and benefits discussed, surgical consent, monitors and equipment checked, pre-op evaluation and anesthesia consent Lidocaine 1% used for infiltration Left, radial was placed Catheter size: 20 G Hand hygiene performed  and maximum sterile barriers used   Attempts: 1 Procedure performed without using ultrasound guided technique. Ultrasound Notes:anatomy identified, needle tip was noted to be adjacent to the nerve/plexus identified and no ultrasound evidence of intravascular and/or intraneural injection Following insertion, dressing applied. Post procedure assessment: normal and unchanged  Patient tolerated the procedure well with no immediate complications.

## 2021-04-21 NOTE — H&P (Addendum)
Chief Complaint: Patient was seen in consultation today for Cerebral arteriogram with possible embolization of R ICA aneurysm at the request of Dr Liborio Nixon   Supervising Physician: Luanne Bras  Patient Status: [name] - Out-pt  History of Present Illness: Leslie Franklin is a 60 y.o. female   Known to [name] seen 04/07/21 for cerebral arteriogram Impression:  S/P 4 vessel cetrebral arteriogram Rt CFA approach. Findings. 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  Was scheduled for possible embolization that day but p2y12 was 5 Dr DEstanislado Pandyheld off and changed Rx Plavix to 37.5 mg daily (instead of 75 mg daily). She has been taking her Plavix with ASA 81 daily P2y12 on 8/11 was 88  Rescheduled now for R ICA paraophthalmic artery aneurysm embolization  She states she has 2 headaches in last week Denies N/V Denies weakness; dizziness Denies speech or vision changes   Past Medical History:  Diagnosis Date   Anxiety 09/13/2020   Cardiac murmur 09/18/2020   Chest pain of uncertain etiology [id number]  Claudication ([place] 09/12/2020   [place]    Depression    Dyspnea    Ex-smoker 09/18/2020   Heart block AV complete ([place] 11/20/2020   Hyperlipidemia    Palpitations 09/18/2020   Peripheral vascular disease ([place]    Presence of permanent cardiac pacemaker 11/20/2020   Screening mammogram for breast cancer 09/12/2020   Stroke ([place]    Syncope 09/12/2020    Past Surgical History:  Procedure Laterality Date   INSERT / REPLACE / REMOVE PACEMAKER     IR ANGIO INTRA EXTRACRAN SEL COM CAROTID INNOMINATE BILAT MOD SED  04/07/2021   IR ANGIO VERTEBRAL SEL SUBCLAVIAN INNOMINATE BILAT MOD SED  04/07/2021   IR RADIOLOGIST EVAL & MGMT  03/12/2021   NO PAST SURGERIES     PACEMAKER IMPLANT N/A 10/21/2020   Procedure: PACEMAKER IMPLANT;  Surgeon: TEvans Lance MD;  [facility];  Service:  Cardiovascular;  Laterality: N/A;   RADIOLOGY WITH ANESTHESIA N/A 04/07/2021   Procedure: EMBOLIZATION;  Surgeon: DLuanne Bras MD;  [place]  Service: Radiology;  Laterality: N/A;    Allergies: Patient has no known allergies.  Medications: Prior to Admission medications   Medication Sig Start Date End Date Taking? Authorizing Provider  aspirin 81 MG chewable tablet Chew 81 mg by mouth daily.    [provider]  Budeson-Glycopyrrol-Formoterol (BREZTRI AEROSPHERE) 160-9-4.8 MCG/ACT AERO Inhale 2 puffs into the lungs 2 (two) times daily. 03/27/21   HRip Harbour NP  clopidogrel (PLAVIX) 75 MG tablet Take 75 mg by mouth daily.    [provider]  hydrOXYzine (ATARAX/VISTARIL) 25 MG tablet Take 1 tablet (25 mg total) by mouth 3 (three) times daily as needed for anxiety. 01/14/21   HRip Harbour NP  rosuvastatin (CRESTOR) 20 MG tablet Take 1 tablet (20 mg total) by mouth daily. 03/27/21 06/25/21  HRip Harbour NP  sertraline (ZOLOFT) 25 MG tablet Take 1 tablet (25 mg total) by mouth daily. 03/27/21   HRip Harbour NP  sertraline (ZOLOFT) 50 MG tablet Take 1 tablet (50 mg total) by mouth daily. 03/27/21   HRip Harbour NP  traZODone (DESYREL) 50 MG tablet Take 1 tablet (50 mg total) by mouth at bedtime. Patient not taking: Reported on 04/02/2021 09/12/20   CMaryruth Hancock MD     Family History  Problem Relation Age of Onset  Hypertension Mother    Diabetes Mother    Heart attack Father    Stroke Father    Emphysema Sister    Stroke Brother     Social History   Socioeconomic History   Marital status: Single    Spouse name: Not on file   Number of children: 4   Years of education: Not on file   Highest education level: Not on file  Occupational History   Not on file  Tobacco Use   Smoking status: Former    Packs/day: 1.00    Years: 41.00    Pack years: 41.00    Types: Cigarettes    Quit date: 08/31/2020    Years since quitting: 0.6    Smokeless tobacco: Never  Vaping Use   Vaping Use: Never used  Substance and Sexual Activity   Alcohol use: Never   Drug use: Never   Sexual activity: Not on file  Other Topics Concern   Not on file  Social History Narrative   Not on file   Social Determinants of Health   Financial Resource Strain: Not on file  Food Insecurity: Not on file  Transportation Needs: Not on file  Physical Activity: Not on file  Stress: Not on file  Social Connections: Not on file     Review of Systems: A 12 point ROS discussed and pertinent positives are indicated in the HPI above.  All other systems are negative.  Review of Systems  Constitutional:  Negative for activity change, fatigue, fever and unexpected weight change.  HENT:  Negative for tinnitus and trouble swallowing.   Eyes:  Negative for visual disturbance.  Respiratory:  Negative for cough and shortness of breath.   Cardiovascular:  Negative for chest pain.  Gastrointestinal:  Negative for abdominal pain, nausea and vomiting.  Neurological:  Positive for headaches. Negative for dizziness, tremors, seizures, syncope, facial asymmetry, speech difficulty, weakness, light-headedness and numbness.  Psychiatric/Behavioral:  Negative for behavioral problems and confusion.    Vital Signs: There were no vitals taken for this visit.  Physical Exam Vitals reviewed.  HENT:     Mouth/Throat:     Mouth: Mucous membranes are moist.  Eyes:     Extraocular Movements: Extraocular movements intact.  Cardiovascular:     Rate and Rhythm: Normal rate and regular rhythm.     Heart sounds: Normal heart sounds.  Pulmonary:     Effort: Pulmonary effort is normal.     Breath sounds: Normal breath sounds.  Abdominal:     Palpations: Abdomen is soft.  Musculoskeletal:        General: No swelling or tenderness. Normal range of motion.     Right lower leg: No edema.     Left lower leg: No edema.  Skin:    General: Skin is warm.  Neurological:      Mental Status: She is alert and oriented to person, place, and time.  Psychiatric:        Mood and Affect: Mood normal.        Behavior: Behavior normal.        Thought Content: Thought content normal.        Judgment: Judgment normal.    Imaging: IR ANGIO INTRA EXTRACRAN SEL COM CAROTID INNOMINATE BILAT MOD SED  Result Date: 04/08/2021 CLINICAL DATA:  History of headaches. Discovery of a large right internal carotid artery paraophthalmic aneurysm. EXAM: IR BILATERAL ANGIO VERTERBRAL SELECTIVE SUBCALVIAN INNOMNATE WITH MODERATE SEDATION COMPARISON:  MRI MRA of the brain  of March 06, 2021. MEDICATIONS: Heparin no units. No antibiotic was administered within 1 hour of the procedure. ANESTHESIA/SEDATION: Mac anesthesia as per Department of Anesthesiology at [place]:  Omnipaque 300 approximately 100 mL. FLUOROSCOPY TIME:  Fluoroscopy Time: 6 minutes 12 seconds (774.5 mGy). COMPLICATIONS: None immediate. TECHNIQUE: Informed written consent was obtained from the patient after a thorough discussion of the procedural risks, benefits and alternatives. All questions were addressed. Maximal Sterile Barrier Technique was utilized including caps, mask, sterile gowns, sterile gloves, sterile drape, hand hygiene and skin antiseptic. A timeout was performed prior to the initiation of the procedure. The right groin was prepped and draped in the usual sterile fashion. Thereafter using modified Seldinger technique, transfemoral access into the right common femoral artery was obtained without difficulty. Over a 0.035 inch guidewire, a 5 French Pinnacle sheath was inserted. Through this, and also over 0.035 inch guidewire, a 5 [place] JB 1 catheter was advanced to the aortic arch region and selectively positioned in the right common carotid artery, the right vertebral artery, the left common carotid artery and the left vertebral artery. Also performed was a 3D rotational arteriogram centered over the right  anterior circulation via the right common carotid artery. 3D reformations were obtained on a separate workstation. FINDINGS: Innominate arteriogram demonstrates the origin of the right common carotid artery and the right subclavian artery to be widely patent. Right common carotid arteriogram demonstrates the right external carotid artery and its major branches to be widely patent. The right internal carotid artery at the bulb has approximately 30% stenosis by the NASCET criteria. No evidence of intraluminal filling defects or of ulcerations is seen. The vessel ascends normally to the cranial skull base. Patency is seen of the petrous, cavernous and the supraclinoid right ICA. A right posterior communicating artery is seen opacifying the right posterior cerebral artery distribution transiently. The right middle cerebral artery and the right anterior cerebral artery opacify into the capillary and venous phases. Demonstrated is a large lobulated saccular aneurysm arising in the paraophthalmic region of the right internal carotid artery. A 3D rotational arteriogram was then performed and demonstrates a 7.8 mm wide, 8.7 mm in the AP projection, and a 9.3 mm long x 9 mm wide aneurysm in the lateral projection with a neck of approximately 4.3 mm. The right vertebral artery origin is widely patent. The vessel is seen to opacify to the cranial skull base. Patency is seen of the right posterior-inferior cerebellar artery and the right vertebrobasilar junction. The opacified portion of the basilar artery, the posterior cerebral arteries, the superior cerebellar arteries and the anterior-inferior cerebellar arteries demonstrates patency into the capillary and venous phases. Unopacified blood is seen in the basilar artery from the contralateral vertebral artery. Simultaneous transient posterior communicating artery opacification is seen from the right vertebral artery injection. Again demonstrated is an approximately 14.3 mm x  10.5 mm large prominent high-riding jugular bulb projecting laterally. The left common carotid arteriogram demonstrates the left external carotid artery and its major branches to be widely patent. The left internal carotid artery at the bulb to the cranial skull base is widely patent. The petrous, cavernous and the supraclinoid segments are widely patent. Less than 2 mm infundibulum is seen at the origin of the left posterior communicating artery. The left middle cerebral artery and the left anterior cerebral artery opacify into the capillary venous phases. The left vertebral artery origin is widely patent. The vessel is seen to opacify to the cranial skull base.  Patency is seen of the left vertebrobasilar junction and the left posterior-inferior cerebellar artery. The opacified portion of the basilar artery, the left posterior cerebral arteries, the superior cerebellar arteries and the anterior-inferior cerebellar arteries demonstrate patency into the capillary and venous phases. Unopacified blood is seen in the basilar artery from the more dominant right vertebral artery as described above. IMPRESSION: Large right internal carotid artery paraophthalmic region lobulated aneurysm measuring 7.8 mm x 8.7 mm in the AP projection, and 9.3 mm x 9 mm in the lateral projection, with a neck of approximately 4.3 mm. Prominent high-riding right internal jugular bulb measuring 14.3 mm x 10.5 mm projecting superolaterally. PLAN: Findings reviewed with patient. Patient will be scheduled for endovascular treatment of the large right internal carotid artery paraophthalmic region aneurysm as soon as possible. Patient also advised to reduce her Plavix to 37.5 mg once a day, and continue with aspirin 325 mg a day. A P2Y12 platelet inhibition test will be performed on Thursday. Patient leaves with understanding and agreement with the above management plan. Electronically Signed   By: Luanne Bras M.D.   On: 04/07/2021 11:50    IR ANGIO VERTEBRAL SEL SUBCLAVIAN INNOMINATE BILAT MOD SED  Result Date: 04/08/2021 CLINICAL DATA:  History of headaches. Discovery of a large right internal carotid artery paraophthalmic aneurysm. EXAM: IR BILATERAL ANGIO VERTERBRAL SELECTIVE SUBCALVIAN INNOMNATE WITH MODERATE SEDATION COMPARISON:  MRI MRA of the brain of March 06, 2021. MEDICATIONS: Heparin no units. No antibiotic was administered within 1 hour of the procedure. ANESTHESIA/SEDATION: Mac anesthesia as per Department of Anesthesiology at [place]:  Omnipaque 300 approximately 100 mL. FLUOROSCOPY TIME:  Fluoroscopy Time: 6 minutes 12 seconds (774.5 mGy). COMPLICATIONS: None immediate. TECHNIQUE: Informed written consent was obtained from the patient after a thorough discussion of the procedural risks, benefits and alternatives. All questions were addressed. Maximal Sterile Barrier Technique was utilized including caps, mask, sterile gowns, sterile gloves, sterile drape, hand hygiene and skin antiseptic. A timeout was performed prior to the initiation of the procedure. The right groin was prepped and draped in the usual sterile fashion. Thereafter using modified Seldinger technique, transfemoral access into the right common femoral artery was obtained without difficulty. Over a 0.035 inch guidewire, a 5 French Pinnacle sheath was inserted. Through this, and also over 0.035 inch guidewire, a 5 [place] JB 1 catheter was advanced to the aortic arch region and selectively positioned in the right common carotid artery, the right vertebral artery, the left common carotid artery and the left vertebral artery. Also performed was a 3D rotational arteriogram centered over the right anterior circulation via the right common carotid artery. 3D reformations were obtained on a separate workstation. FINDINGS: Innominate arteriogram demonstrates the origin of the right common carotid artery and the right subclavian artery to be widely patent.  Right common carotid arteriogram demonstrates the right external carotid artery and its major branches to be widely patent. The right internal carotid artery at the bulb has approximately 30% stenosis by the NASCET criteria. No evidence of intraluminal filling defects or of ulcerations is seen. The vessel ascends normally to the cranial skull base. Patency is seen of the petrous, cavernous and the supraclinoid right ICA. A right posterior communicating artery is seen opacifying the right posterior cerebral artery distribution transiently. The right middle cerebral artery and the right anterior cerebral artery opacify into the capillary and venous phases. Demonstrated is a large lobulated saccular aneurysm arising in the paraophthalmic region of the right internal carotid  artery. A 3D rotational arteriogram was then performed and demonstrates a 7.8 mm wide, 8.7 mm in the AP projection, and a 9.3 mm long x 9 mm wide aneurysm in the lateral projection with a neck of approximately 4.3 mm. The right vertebral artery origin is widely patent. The vessel is seen to opacify to the cranial skull base. Patency is seen of the right posterior-inferior cerebellar artery and the right vertebrobasilar junction. The opacified portion of the basilar artery, the posterior cerebral arteries, the superior cerebellar arteries and the anterior-inferior cerebellar arteries demonstrates patency into the capillary and venous phases. Unopacified blood is seen in the basilar artery from the contralateral vertebral artery. Simultaneous transient posterior communicating artery opacification is seen from the right vertebral artery injection. Again demonstrated is an approximately 14.3 mm x 10.5 mm large prominent high-riding jugular bulb projecting laterally. The left common carotid arteriogram demonstrates the left external carotid artery and its major branches to be widely patent. The left internal carotid artery at the bulb to the cranial  skull base is widely patent. The petrous, cavernous and the supraclinoid segments are widely patent. Less than 2 mm infundibulum is seen at the origin of the left posterior communicating artery. The left middle cerebral artery and the left anterior cerebral artery opacify into the capillary venous phases. The left vertebral artery origin is widely patent. The vessel is seen to opacify to the cranial skull base. Patency is seen of the left vertebrobasilar junction and the left posterior-inferior cerebellar artery. The opacified portion of the basilar artery, the left posterior cerebral arteries, the superior cerebellar arteries and the anterior-inferior cerebellar arteries demonstrate patency into the capillary and venous phases. Unopacified blood is seen in the basilar artery from the more dominant right vertebral artery as described above. IMPRESSION: Large right internal carotid artery paraophthalmic region lobulated aneurysm measuring 7.8 mm x 8.7 mm in the AP projection, and 9.3 mm x 9 mm in the lateral projection, with a neck of approximately 4.3 mm. Prominent high-riding right internal jugular bulb measuring 14.3 mm x 10.5 mm projecting superolaterally. PLAN: Findings reviewed with patient. Patient will be scheduled for endovascular treatment of the large right internal carotid artery paraophthalmic region aneurysm as soon as possible. Patient also advised to reduce her Plavix to 37.5 mg once a day, and continue with aspirin 325 mg a day. A P2Y12 platelet inhibition test will be performed on Thursday. Patient leaves with understanding and agreement with the above management plan. Electronically Signed   By: Luanne Bras M.D.   On: 04/07/2021 11:50    Labs:  CBC: Recent Labs    12/25/20 0956 03/27/21 1000 04/07/21 0552 04/21/21 0608  WBC 8.3 9.4 9.7 8.3  HGB 15.8 15.1 14.0 14.6  HCT 46.9* 46.2 43.0 44.8  PLT 260 313 296 265    COAGS: Recent Labs    04/07/21 0552 04/21/21 0608  INR  1.0 1.0    BMP: Recent Labs    09/13/20 1205 10/18/20 1148 12/25/20 0956 03/27/21 1000 04/07/21 0552 04/21/21 0608  NA 137 138 142 144 140 139  K 4.6 4.3 4.4 5.1 3.9 4.8  CL 99 100 104 104 107 107  CO2 _0 GLUCOSE 92 93 95 83 101* 110*  BUN _1 CALCIUM 9.7 9.7 9.9 9.7 9.3 9.4  CREATININE 1.03* 0.83 1.02* 0.91 0.89 1.07*  GFRNONAA 60 79  --   --  >60 >60  GFRAA 70 91  --   --   --   --  LIVER FUNCTION TESTS: Recent Labs    09/13/20 1205 12/25/20 0956 03/27/21 1000  BILITOT 0.5 0.4 <0.2  AST _0 ALT _1 ALKPHOS 111 118 122*  PROT 6.7 7.1 6.6  ALBUMIN 4.2 4.5 4.5    TUMOR MARKERS: No results for input(s): AFPTM, CEA, CA199, CHROMGRNA in the last 8760 hours.  Assessment and Plan:  Scheduled today for Right  Internal Carotid Artery paraophthalmic artery aneurysm embolization Risks and benefits of cerebral angiogram with intervention were discussed with the patient including, but not limited to bleeding, infection, vascular injury, contrast induced renal failure, stroke or even death.  This interventional procedure involves the use of X-rays and because of the nature of the planned procedure, it is possible that we will have prolonged use of X-ray fluoroscopy.  Potential radiation risks to you include (but are not limited to) the following: - A slightly elevated risk for cancer  several years later in life. This risk is typically less than 0.5% percent. This risk is low in comparison to the normal incidence of human cancer, which is 33% for women and 50% for men according to the [place]. - Radiation induced injury can include skin redness, resembling a rash, tissue breakdown / ulcers and hair loss (which can be temporary or permanent).   The likelihood of either of these occurring depends on the difficulty of the procedure and whether you are sensitive to radiation due to previous procedures, disease, or  genetic conditions.   IF your procedure requires a prolonged use of radiation, you will be notified and given written instructions for further action.  It is your responsibility to monitor the irradiated area for the 2 weeks following the procedure and to notify your physician if you are concerned that you have suffered a radiation induced injury.    All of the patient's questions were answered, patient is agreeable to proceed. Consent signed and in chart.  Pt is aware if intervention is performed, she will be admitted overnight for observation into Neuro ICU. Planned for discharge in am She is agreeable to proceed    Thank you for this interesting consult.  I greatly enjoyed meeting Leslie Franklin and look forward to participating in their care.  A copy of this report was sent to the requesting provider on this date.  Electronically Signed: Lavonia Drafts, PA-C 04/21/2021, 8:09 AM   I spent a total of    25 Minutes in face to face in clinical consultation, greater than 50% of which was counseling/coordinating care for R ICA aneurysm embolization

## 2021-04-21 NOTE — Progress Notes (Signed)
Pacu RN Report to floor given  Gave report to [employer]. [phone]. Discussed surgery, meds given in OR and Pacu, VS, IV fluids given, EBL, urine output, pain and other pertinent information. Also discussed if pt had any family or friends here or belongings with them. Heparin drip at 15ml/hr up and running. Femoral groin is level 0, no bleeding or hematoma.   Brought up to ICU on monitor.   Pt exits my care.

## 2021-04-21 NOTE — Procedures (Signed)
S/P RT common carotid artreriogram RTT CFA approach. S/P embolization  of Rt  ICA  para ophthalmic aneurysm with  Pipeline shield flow diversion with coil assistance . Post CT No ICH. RT groin hemostasis with 88F angioseal .Distal pulses all dopplerable. Extubated. Agitated at first. Responds to simple commands appropriately. Denies any H/As N/V  Pupils 4mm Rt = Lt Moves all 4s equally. S.Karleigh Bunte MD

## 2021-04-22 ENCOUNTER — Encounter (HOSPITAL_COMMUNITY): Payer: Self-pay | Admitting: Interventional Radiology

## 2021-04-22 LAB — CBC WITH DIFFERENTIAL/PLATELET
Abs Immature Granulocytes: 0.05 10*3/uL (ref 0.00–0.07)
Basophils Absolute: 0.1 10*3/uL (ref 0.0–0.1)
Basophils Relative: 0 %
Eosinophils Absolute: 0 10*3/uL (ref 0.0–0.5)
Eosinophils Relative: 0 %
HCT: 34.5 % — ABNORMAL LOW (ref 36.0–46.0)
Hemoglobin: 11.3 g/dL — ABNORMAL LOW (ref 12.0–15.0)
Immature Granulocytes: 0 %
Lymphocytes Relative: 20 %
Lymphs Abs: 2.3 10*3/uL (ref 0.7–4.0)
MCH: 29 pg (ref 26.0–34.0)
MCHC: 32.8 g/dL (ref 30.0–36.0)
MCV: 88.5 fL (ref 80.0–100.0)
Monocytes Absolute: 0.6 10*3/uL (ref 0.1–1.0)
Monocytes Relative: 6 %
Neutro Abs: 8.3 10*3/uL — ABNORMAL HIGH (ref 1.7–7.7)
Neutrophils Relative %: 74 %
Platelets: 224 10*3/uL (ref 150–400)
RBC: 3.9 MIL/uL (ref 3.87–5.11)
RDW: 13 % (ref 11.5–15.5)
WBC: 11.3 10*3/uL — ABNORMAL HIGH (ref 4.0–10.5)
nRBC: 0 % (ref 0.0–0.2)

## 2021-04-22 LAB — BASIC METABOLIC PANEL
Anion gap: 7 (ref 5–15)
BUN: 9 mg/dL (ref 6–20)
CO2: 21 mmol/L — ABNORMAL LOW (ref 22–32)
Calcium: 8.6 mg/dL — ABNORMAL LOW (ref 8.9–10.3)
Chloride: 109 mmol/L (ref 98–111)
Creatinine, Ser: 0.88 mg/dL (ref 0.44–1.00)
GFR, Estimated: >60 mL/min (ref >60)
Glucose, Bld: 94 mg/dL (ref 70–99)
Potassium: 4.1 mmol/L (ref 3.5–5.1)
Sodium: 137 mmol/L (ref 135–145)

## 2021-04-22 LAB — GLUCOSE, CAPILLARY
Glucose-Capillary: 95 mg/dL (ref 70–99)
Glucose-Capillary: 102 mg/dL — ABNORMAL HIGH (ref 70–99)

## 2021-04-22 LAB — HEPARIN LEVEL (UNFRACTIONATED): Heparin Unfractionated: 0.12 IU/mL — ABNORMAL LOW (ref 0.30–0.70)

## 2021-04-22 MED ORDER — CLOPIDOGREL BISULFATE 75 MG PO TABS: 37.5000 mg | ORAL_TABLET | Freq: Every day | ORAL | 1 refills | Status: DC

## 2021-04-22 MED ORDER — CLOPIDOGREL BISULFATE 75 MG PO TABS: 75.0000 mg | ORAL_TABLET | Freq: Every day | ORAL | 1 refills | Status: DC

## 2021-04-22 NOTE — Discharge Summary (Signed)
Patient ID: Leslie Franklin MRN: [id number] DOB/AGE: 02-14-1961 60 y.o.  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 Corliss Skains on 04/21/21.  Procedure occurred without major complications and patient was transferred to floor in stable condition (VSS, right groin puncture site stable) for overnight observation. No major events occurred overnight.   Patient awake and alert, eating breakfast.  Reports mild headache on right side. Most likely due to inflammation caused by embolization per Dr. Corliss Skains, should gradually resolve 5-7 days. May take Tylenol as needed.  Discussed the importance of taking aspirin and Plavix as directed, she will be taking  - aspirin 81 mg once a day AND - Plavix 75 mg once a day on Monday, Wednesday, Friday, and Sunday  - Plavix 37.5 mg once a day on Tuesday, Thursday, and Saturday  Will recheck P2Y12 before discharge.  Patient was instructed to drink plenty of water, activity limitation discussed (no bending, stopping, and lifting more than 10 pound for 2 weeks, no driving for 2 weeks.)    Plan to discharge home today and follow-up with Dr. Corliss Skains at [facility] in 2 weeks.   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 rate (!) 22, height 5\' 4"  (1.626  m), weight 115 lb (52.2 kg), SpO2 96 %.  Physical Exam  Vitals and nursing note reviewed.  Constitutional:      General: He is not in acute distress.    Appearance: Normal appearance.  HENT:     Head: Normocephalic and atraumatic.     Mouth/Throat:     Mouth: Mucous membranes are moist.     Pharynx: Oropharynx is clear.  Cardiovascular:     Rate and Rhythm: Normal rate and regular rhythm.     Pulses: Normal pulses. DP 2+ bilaterally  Pulmonary:     Effort: Pulmonary effort is normal.   Abdominal:     Palpations: Abdomen is soft.  Skin:    General: Skin is warm and dry.  Positive dressing on right CFA puncture site. Small ecchymosis noted at 5 O'clock, 2 inches away from the puncture site. Site is otherwise unremarkable with no erythema, edema, tenderness, bleeding or drainage. Minimal amount of old, dry blood noted on the dressing. Dressing otherwise clean, dry, and intact.   Neurological:    Mental Status: she is alert and oriented to person, place, and time.  Alert, awake, and oriented x 4.  Speech and comprehension intact. PERRL  EOMs intact, nystagmus or subjective diplopia. No facial asymmetry. Tongue midline  Motor power intact all 4  Fine motor and coordination intact Distal pulses 2= bilaterally  Psychiatric:        Mood and Affect: Mood normal.        Behavior: Behavior normal.    Disposition: Discharge disposition: 01-Home or Self Care      Discharge  Instructions     Call MD for:   Complete by: As directed    Call MD for:  difficulty breathing, headache or visual disturbances   Complete by: As directed    Call MD for:  extreme fatigue   Complete by: As directed    Call MD for:  hives   Complete by: As directed    Call MD for:  persistant dizziness or light-headedness   Complete by: As directed    Call MD for:  persistant nausea and vomiting   Complete by: As directed    Call MD for:  redness, tenderness, or signs of infection (pain, swelling,  redness, odor or green/yellow discharge around incision site)   Complete by: As directed    Call MD for:  severe uncontrolled pain   Complete by: As directed    Call MD for:  temperature >100.4   Complete by: As directed    Diet - low sodium heart healthy   Complete by: As directed    Discharge instructions   Complete by: As directed    No bending, lifting, stopping more than 10 pounds for 2 weeks.  No driving for 2 weeks.  Drink plenty of water.   Discharge wound care:   Complete by: As directed    Keep the right groin puncture site dry and clean until full heals. May place a bandage until fully heals.  Do not submerge the right groin area (swimming or sitting in a bath tub) for 7 days.   Increase activity slowly   Complete by: As directed       Allergies as of 04/22/2021   No Known Allergies      Medication List     TAKE these medications    aspirin 81 MG chewable tablet Chew 81 mg by mouth daily.   Breztri Aerosphere 160-9-4.8 MCG/ACT Aero Generic drug: Budeson-Glycopyrrol-Formoterol Inhale 2 puffs into the lungs 2 (two) times daily.   clopidogrel 75 MG tablet Commonly known as: PLAVIX Take 1 tablet (75 mg total) by mouth daily. Take 1 table on Monday, Wednesday, Friday, and Sunday. Start taking on: April 23, 2021 What changed: additional instructions   clopidogrel 75 MG tablet Commonly known as: PLAVIX Take 0.5 tablets (37.5 mg total) by mouth daily. Take half tablet on Tuesday, Thursday, and Saturday. Start taking on: April 23, 2021 What changed: You were already taking a medication with the same name, and this prescription was added. Make sure you understand how and when to take each.   hydrOXYzine 25 MG tablet Commonly known as: ATARAX/VISTARIL Take 1 tablet (25 mg total) by mouth 3 (three) times daily as needed for anxiety.   rosuvastatin 20 MG tablet Commonly known as: CRESTOR Take 1 tablet (20 mg total) by mouth daily.   sertraline 50 MG  tablet Commonly known as: ZOLOFT Take 1 tablet (50 mg total) by mouth daily.   sertraline 25 MG tablet Commonly known as: ZOLOFT Take 1 tablet (25 mg total) by mouth daily.   traZODone 50 MG tablet Commonly known as: DESYREL Take 1 tablet (50 mg total) by mouth at bedtime.               Discharge Care Instructions  (From admission, onward)           Start     Ordered   04/22/21 0000  Discharge wound care:       Comments: Keep the right groin puncture site dry and clean until full heals. May  place a bandage until fully heals.  Do not submerge the right groin area (swimming or sitting in a bath tub) for 7 days.   04/22/21 1005            Follow-up Information     Julieanne Cotton, MD Follow up.   Specialties: Interventional Radiology, Radiology Why: 2 week follow up with Dr. Corliss Skains at [facility]. Our scheduler will call you to set up the appointment. Contact information: [address] [place] [address] [phone]                  Electronically Signed: Willette Brace, PA-C 04/22/2021, 10:23 AM   I have spent Less Than 30 Minutes discharging Leslie Franklin.

## 2021-04-22 NOTE — Progress Notes (Signed)
Patient had about a minute and a half run of V Tach, patient was sleeping in her room. Once awoken patient converted to NSR, patient was asymptomatic of V Tach. Dr. Salvadore Dom aware, send AM labs and if it happens again get a 12-lead EKG.

## 2021-04-22 NOTE — Plan of Care (Signed)
Pt ready for discharge

## 2021-04-23 NOTE — Telephone Encounter (Signed)
Chart reveiw

## 2021-05-01 ENCOUNTER — Ambulatory Visit: Payer: 59 | Admitting: Nurse Practitioner

## 2021-05-05 NOTE — Progress Notes (Signed)
Established Patient Office Visit  Subjective:  Patient ID: Leslie Franklin, female    DOB: 1960-11-01  Age: 60 y.o. MRN: [id number]  CC:  Anxiety/depression med follow-up  HPI Leslie Franklin presents for follow-up of anxiety/depression medication. She is accompanied by her adult daughter. Her daughter unfortunately has metastatic breast cancer. Leslie Franklin states her daughter's health has caused increased anxiety and stress in her life. Leslie Franklin recently has had several person health issues in the past year including pacemaker insertion for third degree heart block, CVA, and underwent recent right ICA ophthalmic aneurysm repair with Dr Estanislado Pandy on 04/21/21.   Anxiety, Follow-up  She was last seen for anxiety 6 weeks ago. Changes made at last visit include Zoloft increased to 75 mg, pt states she is taking 100 mg without relief of symptoms.   She reports good compliance with treatment. She reports good tolerance of treatment. She is not having side effects.  She feels her anxiety is severe and Worse since last visit.  Symptoms: No chest pain Yes difficulty concentrating  Yes dizziness Yes fatigue  No feelings of losing control Yes insomnia  Yes irritable Yes palpitations  Yes panic attacks Yes racing thoughts  Yes shortness of breath No sweating  Yes tremors/shakes    GAD-7 Results GAD-7 Generalized Anxiety Disorder Screening Tool 05/06/2021 03/27/2021  1. Feeling Nervous, Anxious, or on Edge 3 2  2. Not Being Able to Stop or Control Worrying 3 3  3. Worrying Too Much About Different Things 3 3  4. Trouble Relaxing 3 3  5. Being So Restless it's Hard To Sit Still 3 0  6. Becoming Easily Annoyed or Irritable 3 3  7. Feeling Afraid As If Something Awful Might Happen 3 1  Total GAD-7 Score 21 15  Difficulty At Work, Home, or Getting  Along With Others? Very difficult Somewhat difficult     Depression, Follow-up  She  was last seen for this 6 weeks ago. Changes made at last visit include  Zoloft 75 mg daily.   She reports good compliance with treatment. She is not having side effects.   She reports good tolerance of treatment. Current symptoms include: fatigue She feels she is Unchanged since last visit.  Depression screen [facility] 2/9 05/06/2021 03/27/2021 10/10/2020  Decreased Interest 3 0 1  Down, Depressed, Hopeless 1 3 -  PHQ - 2 Score '4 3 1  ' Altered sleeping '2 3 1  ' Tired, decreased energy '3 3 3  ' Change in appetite '3 3 3  ' Feeling bad or failure about yourself  0 1 1  Trouble concentrating '3 3 3  ' Moving slowly or fidgety/restless '3 2 1  ' Suicidal thoughts 0 0 0  PHQ-9 Score '18 18 13  ' Difficult doing work/chores Extremely dIfficult Very difficult Very difficult           Past Medical History:  Diagnosis Date   Anxiety 09/13/2020   Cardiac murmur 09/18/2020   Chest pain of uncertain etiology 69/62/9528   Claudication ([place]) 09/12/2020   COPD (chronic obstructive pulmonary disease) ([place])    Depression    Dyspnea    Ex-smoker 09/18/2020   Heart block AV complete ([place]) 11/20/2020   Hyperlipidemia    Palpitations 09/18/2020   Peripheral vascular disease ([place])    Presence of permanent cardiac pacemaker 11/20/2020   Screening mammogram for breast cancer 09/12/2020   Stroke ([place])    Syncope 09/12/2020    Past Surgical History:  Procedure Laterality Date   INSERT / REPLACE / REMOVE  PACEMAKER     IR ANGIO INTRA EXTRACRAN SEL COM CAROTID INNOMINATE BILAT MOD SED  04/07/2021   IR ANGIO INTRA EXTRACRAN SEL INTERNAL CAROTID UNI R MOD SED  04/21/2021   IR ANGIO VERTEBRAL SEL SUBCLAVIAN INNOMINATE BILAT MOD SED  04/07/2021   IR ANGIOGRAM FOLLOW UP STUDY  04/21/2021   IR ANGIOGRAM FOLLOW UP STUDY  04/21/2021   IR CT HEAD LTD  04/21/2021   IR RADIOLOGIST EVAL & MGMT  03/12/2021   IR TRANSCATH/EMBOLIZ  04/21/2021   NO PAST SURGERIES     PACEMAKER IMPLANT N/A 10/21/2020   Procedure: PACEMAKER IMPLANT;  Surgeon: Evans Lance, MD;  Location: [facility];  Service:  Cardiovascular;  Laterality: N/A;   RADIOLOGY WITH ANESTHESIA N/A 04/07/2021   Procedure: EMBOLIZATION;  Surgeon: Luanne Bras, MD;  Location: [place];  Service: Radiology;  Laterality: N/A;   RADIOLOGY WITH ANESTHESIA N/A 04/21/2021   Procedure: RADIOLOGY WITH ANESTHESIA EMBOLIZATION;  Surgeon: Luanne Bras, MD;  Location: [place];  Service: Radiology;  Laterality: N/A;    Family History  Problem Relation Age of Onset   Hypertension Mother    Diabetes Mother    Heart attack Father    Stroke Father    Emphysema Sister    Stroke Brother     Social History   Socioeconomic History   Marital status: Single    Spouse name: Not on file   Number of children: 4   Years of education: Not on file   Highest education level: Not on file  Occupational History   Not on file  Tobacco Use   Smoking status: Former    Packs/day: 1.00    Years: 41.00    Pack years: 41.00    Types: Cigarettes    Quit date: 08/31/2020    Years since quitting: 0.6   Smokeless tobacco: Never  Vaping Use   Vaping Use: Never used  Substance and Sexual Activity   Alcohol use: Never   Drug use: Never   Sexual activity: Not on file  Other Topics Concern   Not on file  Social History Narrative   Not on file   Social Determinants of Health   Financial Resource Strain: Not on file  Food Insecurity: Not on file  Transportation Needs: Not on file  Physical Activity: Not on file  Stress: Not on file  Social Connections: Not on file  Intimate Partner Violence: Not on file    Outpatient Medications Prior to Visit  Medication Sig Dispense Refill   aspirin 81 MG chewable tablet Chew 81 mg by mouth daily.     Budeson-Glycopyrrol-Formoterol (BREZTRI AEROSPHERE) 160-9-4.8 MCG/ACT AERO Inhale 2 puffs into the lungs 2 (two) times daily. 10.7 g 0   clopidogrel (PLAVIX) 75 MG tablet Take 1 tablet (75 mg total) by mouth daily. Take 1 table on Monday, Wednesday, Friday, and Sunday. 30 tablet 1   clopidogrel  (PLAVIX) 75 MG tablet Take 0.5 tablets (37.5 mg total) by mouth daily. Take half tablet on Tuesday, Thursday, and Saturday. 30 tablet 1   hydrOXYzine (ATARAX/VISTARIL) 25 MG tablet Take 1 tablet (25 mg total) by mouth 3 (three) times daily as needed for anxiety. 30 tablet 0   rosuvastatin (CRESTOR) 20 MG tablet Take 1 tablet (20 mg total) by mouth daily. 90 tablet 1   sertraline (ZOLOFT) 25 MG tablet Take 1 tablet (25 mg total) by mouth daily. 90 tablet 3   sertraline (ZOLOFT) 50 MG tablet Take 1 tablet (50 mg  total) by mouth daily. 90 tablet 3   traZODone (DESYREL) 50 MG tablet Take 1 tablet (50 mg total) by mouth at bedtime. (Patient not taking: Reported on 04/02/2021) 30 tablet 3   No facility-administered medications prior to visit.    No Known Allergies  ROS Review of Systems  Constitutional:  Negative for chills, fatigue and fever.  HENT:  Negative for congestion, ear pain, rhinorrhea and sore throat.   Eyes: Negative.   Respiratory:  Positive for shortness of breath. Negative for cough.   Cardiovascular:  Positive for palpitations. Negative for chest pain.  Gastrointestinal:  Negative for abdominal pain, constipation, diarrhea, nausea and vomiting.  Endocrine: Negative.   Genitourinary:  Negative for dysuria and urgency.  Musculoskeletal:  Negative for arthralgias, back pain and myalgias.  Skin: Negative.   Allergic/Immunologic: Negative.   Neurological:  Positive for headaches. Negative for dizziness, weakness and light-headedness.  Hematological: Negative.   Psychiatric/Behavioral:  Positive for decreased concentration, dysphoric mood and sleep disturbance. The patient is nervous/anxious.      Objective:    Physical Exam Vitals reviewed.  Constitutional:      Appearance: Normal appearance.  HENT:     Head: Normocephalic.  Neck:     Vascular: No carotid bruit.  Abdominal:     Tenderness: There is no abdominal tenderness. There is no guarding.  Musculoskeletal:         General: No swelling.  Skin:    General: Skin is warm and dry.     Capillary Refill: Capillary refill takes less than 2 seconds.  Neurological:     General: No focal deficit present.     Mental Status: She is alert and oriented to person, place, and time.  Psychiatric:        Mood and Affect: Mood normal.        Behavior: Behavior normal.    BP 130/76   Pulse 93   Temp (!) 97 F (36.1 C)   Ht '5\' 3"'  (1.6 m)   Wt 112 lb (50.8 kg)   SpO2 99%   BMI 19.84 kg/m   Wt Readings from Last 3 Encounters:  04/21/21 115 lb (52.2 kg)  04/07/21 115 lb (52.2 kg)  03/27/21 115 lb (52.2 kg)     Health Maintenance Due  Topic Date Due   HIV Screening  Never done   Hepatitis C Screening  Never done   TETANUS/TDAP  Never done   Zoster Vaccines- Shingrix (1 of 2) Never done   COVID-19 Vaccine (3 - Booster for Moderna series) 01/31/2021   INFLUENZA VACCINE  Never done    Lab Results  Component Value Date   TSH 1.220 09/13/2020   Lab Results  Component Value Date   WBC 11.3 (H) 04/22/2021   HGB 11.3 (L) 04/22/2021   HCT 34.5 (L) 04/22/2021   MCV 88.5 04/22/2021   PLT 224 04/22/2021   Lab Results  Component Value Date   NA 137 04/22/2021   K 4.1 04/22/2021   CO2 21 (L) 04/22/2021   GLUCOSE 94 04/22/2021   BUN 9 04/22/2021   CREATININE 0.88 04/22/2021   BILITOT <0.2 03/27/2021   ALKPHOS 122 (H) 03/27/2021   AST 17 03/27/2021   ALT 10 03/27/2021   PROT 6.6 03/27/2021   ALBUMIN 4.5 03/27/2021   CALCIUM 8.6 (L) 04/22/2021   ANIONGAP 7 04/22/2021   EGFR 73 03/27/2021   Lab Results  Component Value Date   CHOL 154 03/27/2021   Lab Results  Component  Value Date   HDL 49 03/27/2021   Lab Results  Component Value Date   LDLCALC 89 03/27/2021   Lab Results  Component Value Date   TRIG 87 03/27/2021   Lab Results  Component Value Date   CHOLHDL 3.1 03/27/2021       Assessment & Plan:   1. GAD (generalized anxiety disorder) - busPIRone (BUSPAR) 10 MG tablet;  Take 1 tablet (10 mg total) by mouth 3 (three) times daily.  Dispense: 90 tablet; Refill: 1  2. S/P aneurysm repair -follow-up with Dr Estanislado Pandy as scheduled  3. Encounter for immunization - Flu Vaccine MDCK QUAD PF  Stop Zoloft Begin Buspar 10 mg three times daily as needed for anxiety Notify office immediately of any adverse side effects, stop medication immediately of any serious side effects Contact [facility] for [phone] Flu shot given in office today Follow-up in 4-weeks, telemedicine     Follow-up:  4-weeks, telemedicine vide visit  I, Rip Harbour, NP, have reviewed all documentation for this visit. The documentation on 05/06/21 for the exam, diagnosis, procedures, and orders are all accurate and complete.   Signed, Rip Harbour, NP

## 2021-05-06 ENCOUNTER — Encounter: Payer: Self-pay | Admitting: Nurse Practitioner

## 2021-05-06 ENCOUNTER — Ambulatory Visit (HOSPITAL_COMMUNITY)
Admission: RE | Admit: 2021-05-06 | Discharge: 2021-05-06 | Disposition: A | Discharge status: Home / Self Care | Payer: 59 | Source: Ambulatory Visit | Attending: Student | Admitting: Student

## 2021-05-06 ENCOUNTER — Other Ambulatory Visit: Payer: Self-pay

## 2021-05-06 ENCOUNTER — Other Ambulatory Visit (HOSPITAL_COMMUNITY): Payer: Self-pay

## 2021-05-06 ENCOUNTER — Ambulatory Visit (INDEPENDENT_AMBULATORY_CARE_PROVIDER_SITE_OTHER): Payer: 59 | Admitting: Nurse Practitioner

## 2021-05-06 VITALS — BP 130/76 | HR 93 | Temp 97.0°F | Ht 63.0 in | Wt 112.0 lb

## 2021-05-06 DIAGNOSIS — Z23 Encounter for immunization: Secondary | ICD-10-CM

## 2021-05-06 DIAGNOSIS — R519 Headache, unspecified: Secondary | ICD-10-CM | POA: Insufficient documentation

## 2021-05-06 DIAGNOSIS — I729 Aneurysm of unspecified site: Secondary | ICD-10-CM | POA: Insufficient documentation

## 2021-05-06 DIAGNOSIS — Z8679 Personal history of other diseases of the circulatory system: Secondary | ICD-10-CM | POA: Diagnosis not present

## 2021-05-06 DIAGNOSIS — Z7902 Long term (current) use of antithrombotics/antiplatelets: Secondary | ICD-10-CM | POA: Diagnosis not present

## 2021-05-06 DIAGNOSIS — Z7982 Long term (current) use of aspirin: Secondary | ICD-10-CM | POA: Insufficient documentation

## 2021-05-06 DIAGNOSIS — F411 Generalized anxiety disorder: Secondary | ICD-10-CM

## 2021-05-06 DIAGNOSIS — Z9889 Other specified postprocedural states: Secondary | ICD-10-CM | POA: Diagnosis not present

## 2021-05-06 MED ORDER — BUSPIRONE HCL 10 MG PO TABS: 10.0000 mg | ORAL_TABLET | Freq: Three times a day (TID) | ORAL | 1 refills | Status: DC

## 2021-05-06 NOTE — Patient Instructions (Addendum)
Stop Zoloft Begin Buspar 10 mg three times daily as needed for anxiety Notify office immediately of any adverse side effects, stop medication immediately of any serious side effects Contact [facility] for appointment:[phone] Flu shot given in office today Follow-up in 4-weeks, telemedicine video visit    Managing Anxiety, Adult After being diagnosed with an anxiety disorder, you may be relieved to know why you have felt or behaved a certain way. You may also feel overwhelmed about the treatment ahead and what it will mean for your life. With care and support, you can manage this condition and recover from it. How to manage lifestyle changes Managing stress and anxiety Stress is your body's reaction to life changes and events, both good and bad. Most stress will last just a few hours, but stress can be ongoing and can lead to more than just stress. Although stress can play a major role in anxiety, it is not the same as anxiety. Stress is usually caused by something external, such as a deadline, test, or competition. Stress normally passes after the triggering event has ended.  Anxiety is caused by something internal, such as imagining a terrible outcome or worrying that something will go wrong that will devastate you. Anxiety often does not go away even after the triggering event is over, and it can become long-term (chronic) worry. It is important to understand the differences between stress and anxiety and to manage your stress effectively so that it does not lead to an anxious response. Talk with your health care provider or a counselor to learn more about reducing anxiety and stress. He or she may suggest tension reduction techniques, such as: Music therapy. This can include creating or listening to music that you enjoy and that inspires you. Mindfulness-based meditation. This involves being aware of your normal breaths while not trying to control your breathing. It can be done while  sitting or walking. Centering prayer. This involves focusing on a word, phrase, or sacred image that means something to you and brings you peace. Deep breathing. To do this, expand your stomach and inhale slowly through your nose. Hold your breath for 3-5 seconds. Then exhale slowly, letting your stomach muscles relax. Self-talk. This involves identifying thought patterns that lead to anxiety reactions and changing those patterns. Muscle relaxation. This involves tensing muscles and then relaxing them. Choose a tension reduction technique that suits your lifestyle and personality. These techniques take time and practice. Set aside 5-15 minutes a day to do them. Therapists can offer counseling and training in these techniques. The training to help with anxiety may be covered by some insurance plans. Other things you can do to manage stress and anxiety include: Keeping a stress/anxiety diary. This can help you learn what triggers your reaction and then learn ways to manage your response. Thinking about how you react to certain situations. You may not be able to control everything, but you can control your response. Making time for activities that help you relax and not feeling guilty about spending your time in this way. Visual imagery and yoga can help you stay calm and relax.  Medicines Medicines can help ease symptoms. Medicines for anxiety include: Anti-anxiety drugs. Antidepressants. Medicines are often used as a primary treatment for anxiety disorder. Medicines will be prescribed by a health care provider. When used together, medicines, psychotherapy, and tension reduction techniques may be the most effective treatment. Relationships Relationships can play a big part in helping you recover. Try to spend more time connecting  with trusted friends and family members. Consider going to couples counseling, taking family education classes, or going to family therapy. Therapy can help you and others  better understand your condition. How to recognize changes in your anxiety Everyone responds differently to treatment for anxiety. Recovery from anxiety happens when symptoms decrease and stop interfering with your daily activities at home or work. This may mean that you will start to: Have better concentration and focus. Worry will interfere less in your daily thinking. Sleep better. Be less irritable. Have more energy. Have improved memory. It is important to recognize when your condition is getting worse. Contact your health care provider if your symptoms interfere with home or work and you feel like your condition is not improving. Follow these instructions at home: Activity Exercise. Most adults should do the following: Exercise for at least 150 minutes each week. The exercise should increase your heart rate and make you sweat (moderate-intensity exercise). Strengthening exercises at least twice a week. Get the right amount and quality of sleep. Most adults need 7-9 hours of sleep each night. Lifestyle  Eat a healthy diet that includes plenty of vegetables, fruits, whole grains, low-fat dairy products, and lean protein. Do not eat a lot of foods that are high in solid fats, added sugars, or salt. Make choices that simplify your life. Do not use any products that contain nicotine or tobacco, such as cigarettes, e-cigarettes, and chewing tobacco. If you need help quitting, ask your health care provider. Avoid caffeine, alcohol, and certain over-the-counter cold medicines. These may make you feel worse. Ask your pharmacist which medicines to avoid. General instructions Take over-the-counter and prescription medicines only as told by your health care provider. Keep all follow-up visits as told by your health care provider. This is important. Where to find support You can get help and support from these sources: Self-help groups. Online and [employer]. A trusted spiritual  leader. Couples counseling. Family education classes. Family therapy. Where to find more information You may find that joining a support group helps you deal with your anxiety. The following sources can help you locate counselors or support groups near you: Mental Health America: www.mentalhealthamerica.net Anxiety and Depression Association of [place] (ADAA): [employer]Cam.de [employer] on Mental Illness (NAMI): www.nami.org Contact a health care provider if you: Have a hard time staying focused or finishing daily tasks. Spend many hours a day feeling worried about everyday life. Become exhausted by worry. Start to have headaches, feel tense, or have nausea. Urinate more than normal. Have diarrhea. Get help right away if you have: A racing heart and shortness of breath. Thoughts of hurting yourself or others. If you ever feel like you may hurt yourself or others, or have thoughts about taking your own life, get help right away. You can go to your nearest emergency department or call: Your local emergency services (911 in the [place] A suicide crisis helpline, such as the National Suicide Prevention Lifeline at 660-202-0861. This is open 24 hours a day. Summary Taking steps to learn and use tension reduction techniques can help calm you and help prevent triggering an anxiety reaction. When used together, medicines, psychotherapy, and tension reduction techniques may be the most effective treatment. Family, friends, and partners can play a big part in helping you recover from an anxiety disorder. This information is not intended to replace advice given to you by your health care provider. Make sure you discuss any questions you have with your health care provider. Document Revised: 01/17/2019  Document Reviewed: 01/17/2019 Elsevier Patient Education  2022 Elsevier Inc.   Serotonin Syndrome Serotonin is a chemical in your body (neurotransmitter) that helps to control several  functions, such as: Brain and nerve cell function. Mood and emotions. Memory. Eating. Sleeping. Sexual activity. Stress response. Having too much serotonin in your body can cause serotonin syndrome. This condition can be harmful to your brain and nerve cells. This can be a life-threatening condition. What are the causes? This condition may be caused by taking medicines or drugs that increase the level of serotonin in your body, such as: Antidepressant medicines. Migraine medicines. Certain pain medicines. Certain drugs, including ecstasy, LSD, cocaine, and amphetamines. Over-the-counter cough or cold medicines that contain dextromethorphan. Certain herbal supplements, including St. John's wort, ginseng, and nutmeg. This condition usually occurs when you take these medicines or drugs in combination, but it can also happen with a high dose of a single medicine or drug. What increases the risk? You are more likely to develop this condition if: You just started taking a medicine or drug that increases the level of serotonin in the body. You recently increased the dose of a medicine or drug that increases the level of serotonin in the body. You take more than one medicine or drug that increases the level of serotonin in the body. What are the signs or symptoms? Symptoms of this condition usually start within several hours of taking a medicine or drug. Symptoms may be mild or severe. Mild symptoms include: Sweating. Restlessness or agitation. Muscle twitching or stiffness. Rapid heart rate. Nausea and vomiting. Diarrhea. Headache. Shivering or goose bumps. Confusion. Severe symptoms include: Irregular heartbeat. Seizures. Loss of consciousness. High fever. How is this diagnosed? This condition may be diagnosed based on: Your medical history.  A physical exam. Your prior use of drugs and medicines. Blood or urine tests. These may be used to rule out other causes of your  symptoms. How is this treated? The treatment for this condition depends on the severity of your symptoms. For mild cases, stopping the medicine or drug that caused your condition is usually all that is needed. For moderate to severe cases, treatment in a hospital may be needed to prevent or manage life-threatening symptoms. This may include medicines to control your symptoms, IV fluids, interventions to support your breathing, and treatments to control your body temperature. Follow these instructions at home: Medicines  Take over-the-counter and prescription medicines only as told by your health care provider. This is important. Check with your health care provider before you start taking any new prescriptions, over-the-counter medicines, herbs, or supplements. Avoid combining any medicines that can cause this condition to occur. Lifestyle  Maintain a healthy lifestyle. Eat a healthy diet that includes plenty of vegetables, fruits, whole grains, low-fat dairy products, and lean protein. Do not eat a lot of foods that are high in fat, added sugars, or salt. Get the right amount and quality of sleep. Most adults need 7-9 hours of sleep each night. Make time to exercise, even if it is only for short periods of time. Most adults should exercise for at least 150 minutes each week. Do not drink alcohol. Do not use illegal drugs, and do not take medicines for reasons other than they are prescribed. General instructions Do not use any products that contain nicotine or tobacco, such as cigarettes and e-cigarettes. If you need help quitting, ask your health care provider. Keep all follow-up visits as told by your health care provider. This is important.  Contact a health care provider if: Your symptoms do not improve or they get worse. Get help right away if you: Have worsening confusion, severe headache, chest pain, high fever, seizures, or loss of consciousness. Experience serious side effects of  medicine, such as swelling of your face, lips, tongue, or throat. Have serious thoughts about hurting yourself or others. These symptoms may represent a serious problem that is an emergency. Do not wait to see if the symptoms will go away. Get medical help right away. Call your local emergency services (911 in the [place] Do not drive yourself to the hospital. If you ever feel like you may hurt yourself or others, or have thoughts about taking your own life, get help right away. You can go to your nearest emergency department or call: Your local emergency services (911 in the [place] A suicide crisis helpline, such as the National Suicide Prevention Lifeline at 931 675 86811-(903)032-3330. This is open 24 hours a day. Summary Serotonin is a brain chemical that helps to regulate the nervous system. High levels of serotonin in the body can cause serotonin syndrome, which is a very dangerous condition. This condition may be caused by taking medicines or drugs that increase the level of serotonin in your body. Treatment depends on the severity of your symptoms. For mild cases, stopping the medicine or drug that caused your condition is usually all that is needed. Check with your health care provider before you start taking any new prescriptions, over-the-counter medicines, herbs, or supplements. This information is not intended to replace advice given to you by your health care provider. Make sure you discuss any questions you have with your health care provider. Document Revised: 09/24/2017 Document Reviewed: 09/24/2017 Elsevier Patient Education  2022 [employer]

## 2021-05-07 HISTORY — PX: IR RADIOLOGIST EVAL & MGMT: IMG5224

## 2021-05-07 NOTE — Progress Notes (Signed)
Remote pacemaker transmission.   

## 2021-05-12 LAB — PLATELET INHIBITION P2Y12

## 2021-05-13 ENCOUNTER — Telehealth: Payer: Self-pay | Admitting: Student

## 2021-05-13 LAB — PLATELET INHIBITION P2Y12

## 2021-05-13 NOTE — Telephone Encounter (Signed)
Recent P2Y12 level of 3. Patient called with this result and she was instructed to decrease Plavix to just 37.5 mg daily (prior regimen was 75 mg Mon-Wed-Fri-Sun and 37.5 mg Tues-Thurs-Sat).  Patient also aware that a repeat P2Y12 level is needed in 5-6 days. A scheduler from our office will call her with a date/time.  Alwyn Ren, [place] [phone] 05/13/2021, 3:29 PM

## 2021-05-19 ENCOUNTER — Other Ambulatory Visit (HOSPITAL_COMMUNITY): Payer: Self-pay

## 2021-05-19 ENCOUNTER — Other Ambulatory Visit (HOSPITAL_COMMUNITY)
Admission: RE | Admit: 2021-05-19 | Discharge: 2021-05-19 | Disposition: A | Discharge status: Home / Self Care | Payer: 59 | Source: Ambulatory Visit | Attending: Interventional Radiology | Admitting: Interventional Radiology

## 2021-05-19 DIAGNOSIS — I729 Aneurysm of unspecified site: Secondary | ICD-10-CM

## 2021-05-20 LAB — PLATELET INHIBITION P2Y12

## 2021-06-03 ENCOUNTER — Telehealth (INDEPENDENT_AMBULATORY_CARE_PROVIDER_SITE_OTHER): Payer: 59 | Admitting: Nurse Practitioner

## 2021-06-03 DIAGNOSIS — F411 Generalized anxiety disorder: Secondary | ICD-10-CM | POA: Diagnosis not present

## 2021-06-03 DIAGNOSIS — F321 Major depressive disorder, single episode, moderate: Secondary | ICD-10-CM

## 2021-06-03 MED ORDER — ESCITALOPRAM OXALATE 10 MG PO TABS: 10.0000 mg | ORAL_TABLET | Freq: Every day | ORAL | 0 refills | Status: DC

## 2021-06-03 NOTE — Progress Notes (Signed)
Virtual Visit via Telephone Note   This visit type was conducted due to national recommendations for restrictions regarding the COVID-19 Pandemic (e.g. social distancing) in an effort to limit this patient's exposure and mitigate transmission in our community.  Due to her co-morbid illnesses, this patient is at least at moderate risk for complications without adequate follow up.  This format is felt to be most appropriate for this patient at this time.  The patient did not have access to video technology/had technical difficulties with video requiring transitioning to audio format only (telephone).  All issues noted in this document were discussed and addressed.  No physical exam could be performed with this format.  Patient verbally consented to a telehealth visit.   Date:  06/03/2021   ID:  Leslie Franklin, DOB July 20, 1961, MRN [id number]  Patient Location: Home Provider Location: Office/Clinic  PCP:  [name], NP   Evaluation Performed:  Follow-Up Visit, established patient  Chief Complaint:  Generalized anxiety disorder  History of Present Illness:    Leslie Franklin is a 60 y.o. female with anxiety. States that buspar has helped some.  Anxiety, Follow-up  She was last seen for anxiety 4 weeks ago. Changes made at last visit include Buspar 10 mg TID.   She reports excellent compliance with treatment. She reports good tolerance of treatment. She is not having side effects.   She feels her anxiety is moderate and Improved since last visit.  Symptoms: No chest pain No difficulty concentrating  No dizziness Yes fatigue  No feelings of losing control No insomnia  Yes irritable No palpitations  No panic attacks No racing thoughts  No shortness of breath No sweating  No tremors/shakes    GAD-7 Results GAD-7 Generalized Anxiety Disorder Screening Tool 06/03/2021 05/06/2021 03/27/2021  1. Feeling Nervous, Anxious, or on Edge 1 3 2   2. Not Being Able to Stop or Control Worrying 1  3 3   3. Worrying Too Much About Different Things 1 3 3   4. Trouble Relaxing 1 3 3   5. Being So Restless it's Hard To Sit Still 0 3 0  6. Becoming Easily Annoyed or Irritable 3 3 3   7. Feeling Afraid As If Something Awful Might Happen 0 3 1  Total GAD-7 Score 7 21 15   Difficulty At Work, Home, or Getting  Along With Others? Very difficult Very difficult Somewhat difficult    PHQ-9 Scores PHQ9 SCORE ONLY 05/06/2021 03/27/2021 10/10/2020  PHQ-9 Total Score 18 18 13      The patient does not have symptoms concerning for COVID-19 infection (fever, chills, cough, or new shortness of breath).    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   Screening mammogram for breast cancer 09/12/2020   Stroke (HCC)    Syncope 09/12/2020    Past Surgical History:  Procedure Laterality Date   INSERT / REPLACE / REMOVE PACEMAKER     IR ANGIO INTRA EXTRACRAN SEL COM CAROTID INNOMINATE BILAT MOD SED  04/07/2021   IR ANGIO INTRA EXTRACRAN SEL INTERNAL CAROTID UNI R MOD SED  04/21/2021  IR ANGIO VERTEBRAL SEL SUBCLAVIAN INNOMINATE BILAT MOD SED  04/07/2021   IR ANGIOGRAM FOLLOW UP STUDY  04/21/2021   IR ANGIOGRAM FOLLOW UP STUDY  04/21/2021   IR CT HEAD LTD  04/21/2021   IR RADIOLOGIST EVAL & MGMT  03/12/2021   IR RADIOLOGIST EVAL & MGMT  05/07/2021   IR TRANSCATH/EMBOLIZ  04/21/2021   NO PAST SURGERIES     PACEMAKER IMPLANT N/A 10/21/2020   Procedure: PACEMAKER IMPLANT;  Surgeon: Marinus Maw, MD;  Location: [facility];  Service: Cardiovascular;  Laterality: N/A;   RADIOLOGY WITH ANESTHESIA N/A  04/07/2021   Procedure: EMBOLIZATION;  Surgeon: Julieanne Cotton, MD;  Location: [facility] OR;  Service: Radiology;  Laterality: N/A;   RADIOLOGY WITH ANESTHESIA N/A 04/21/2021   Procedure: RADIOLOGY WITH ANESTHESIA EMBOLIZATION;  Surgeon: Julieanne Cotton, MD;  Location: [facility] OR;  Service: Radiology;  Laterality: N/A;    Family History  Problem Relation Age of Onset   Hypertension Mother    Diabetes Mother    Heart attack Father    Stroke Father    Emphysema Sister    Stroke Brother     Social History   Socioeconomic History   Marital status: Single    Spouse name: Not on file   Number of children: 4   Years of education: Not on file   Highest education level: Not on file  Occupational History   Not on file  Tobacco Use   Smoking status: Former    Packs/day: 1.00    Years: 41.00    Pack years: 41.00    Types: Cigarettes    Quit date: 08/31/2020    Years since quitting: 0.7   Smokeless tobacco: Never  Vaping Use   Vaping Use: Never used  Substance and Sexual Activity   Alcohol use: Never   Drug use: Never   Sexual activity: Not on file  Other Topics Concern   Not on file  Social History Narrative   Not on file   Social Determinants of Health   Financial Resource Strain: Not on file  Food Insecurity: Not on file  Transportation Needs: Not on file  Physical Activity: Not on file  Stress: Not on file  Social Connections: Not on file  Intimate Partner Violence: Not on file    Outpatient Medications Prior to Visit  Medication Sig Dispense Refill   aspirin 81 MG chewable tablet Chew 81 mg by mouth daily.     Budeson-Glycopyrrol-Formoterol (BREZTRI AEROSPHERE) 160-9-4.8 MCG/ACT AERO Inhale 2 puffs into the lungs 2 (two) times daily. 10.7 g 0   busPIRone (BUSPAR) 10 MG tablet Take 1 tablet (10 mg total) by mouth 3 (three) times daily. 90 tablet 1   clopidogrel (PLAVIX) 75 MG tablet Take 1 tablet (75 mg total) by mouth daily. Take 1 table on Monday, Wednesday, Friday, and  Sunday. 30 tablet 1   clopidogrel (PLAVIX) 75 MG tablet Take 0.5 tablets (37.5 mg total) by mouth daily. Take half tablet on Tuesday, Thursday, and Saturday. 30 tablet 1   hydrOXYzine (ATARAX/VISTARIL) 25 MG tablet Take 1 tablet (25 mg total) by mouth 3 (three) times daily as needed for anxiety. 30 tablet 0   rosuvastatin (CRESTOR) 20 MG tablet Take 1 tablet (20 mg total) by mouth daily. 90 tablet 1   No facility-administered medications prior to visit.    Allergies:   Patient has no known allergies.   Social History   Tobacco Use   Smoking status: Former  Packs/day: 1.00    Years: 41.00    Pack years: 41.00    Types: Cigarettes    Quit date: 08/31/2020    Years since quitting: 0.7   Smokeless tobacco: Never  Vaping Use   Vaping Use: Never used  Substance Use Topics   Alcohol use: Never   Drug use: Never     Review of Systems  Constitutional:  Negative for chills, fever and malaise/fatigue.  HENT:  Negative for ear pain, sinus pain and sore throat.   Respiratory:  Negative for cough and shortness of breath.   Cardiovascular:  Negative for chest pain.  Musculoskeletal:  Negative for myalgias.  Neurological:  Negative for dizziness and headaches (Sometimes).    Labs/Other Tests and Data Reviewed:    Recent Labs: 09/13/2020: TSH 1.220 03/27/2021: ALT 10 04/22/2021: BUN 9; Creatinine, Ser 0.88; Hemoglobin 11.3; Platelets 224; Potassium 4.1; Sodium 137   Recent Lipid Panel Lab Results  Component Value Date/Time   CHOL 154 03/27/2021 10:00 AM   TRIG 87 03/27/2021 10:00 AM   HDL 49 03/27/2021 10:00 AM   CHOLHDL 3.1 03/27/2021 10:00 AM   LDLCALC 89 03/27/2021 10:00 AM    Wt Readings from Last 3 Encounters:  05/06/21 112 lb (50.8 kg)  04/21/21 115 lb (52.2 kg)  04/07/21 115 lb (52.2 kg)     Objective:    Vital Signs:  There were no vitals taken for this visit.   Physical Exam No physical exam performed due to telemedicine visit  ASSESSMENT & PLAN:     1. GAD  (generalized anxiety disorder)  -continue Buspar 10 mg TID       COVID-19 Education: The signs and symptoms of COVID-19 were discussed with the patient and how to seek care for testing (follow up with PCP or arrange E-visit). The importance of social distancing was discussed today.   I spent 10 minutes dedicated to the care of this patient on the date of this encounter to include face-to-face time with the patient, as well as: EMR  Follow Up:  In Person in 4 week(s)  Signed,  Flonnie Hailstone, DNP  06/08/2021 at  9:39 PM [facility]

## 2021-06-08 ENCOUNTER — Encounter: Payer: Self-pay | Admitting: Nurse Practitioner

## 2021-06-08 MED ORDER — BUSPIRONE HCL 10 MG PO TABS: 10.0000 mg | ORAL_TABLET | Freq: Three times a day (TID) | ORAL | 1 refills | Status: DC

## 2021-06-17 DIAGNOSIS — R0600 Dyspnea, unspecified: Secondary | ICD-10-CM | POA: Insufficient documentation

## 2021-06-17 DIAGNOSIS — I739 Peripheral vascular disease, unspecified: Secondary | ICD-10-CM | POA: Insufficient documentation

## 2021-06-17 DIAGNOSIS — I639 Cerebral infarction, unspecified: Secondary | ICD-10-CM | POA: Insufficient documentation

## 2021-07-02 ENCOUNTER — Encounter: Payer: Self-pay | Admitting: Cardiology

## 2021-07-02 ENCOUNTER — Other Ambulatory Visit: Payer: Self-pay

## 2021-07-02 ENCOUNTER — Ambulatory Visit (INDEPENDENT_AMBULATORY_CARE_PROVIDER_SITE_OTHER): Payer: 59 | Admitting: Cardiology

## 2021-07-02 VITALS — BP 116/72 | HR 74 | Ht 63.0 in | Wt 113.0 lb

## 2021-07-02 DIAGNOSIS — I442 Atrioventricular block, complete: Secondary | ICD-10-CM

## 2021-07-02 DIAGNOSIS — I6529 Occlusion and stenosis of unspecified carotid artery: Secondary | ICD-10-CM

## 2021-07-02 DIAGNOSIS — I671 Cerebral aneurysm, nonruptured: Secondary | ICD-10-CM

## 2021-07-02 DIAGNOSIS — Z950 Presence of cardiac pacemaker: Secondary | ICD-10-CM | POA: Diagnosis not present

## 2021-07-02 NOTE — Progress Notes (Signed)
Cardiology Office Note:    Date:  07/02/2021   ID:  Leslie Franklin, DOB 1961/04/28, MRN [id number]  PCP:  [name], NP  Cardiologist:  Garwin Brothers, MD   Referring MD: [name], NP    ASSESSMENT:    1. Brain aneurysm   2. Carotid atherosclerosis, unspecified laterality   3. Heart block AV complete (HCC)   4. Presence of permanent cardiac pacemaker    PLAN:    In order of problems listed above:  Atherosclerotic vascular disease: Secondary prevention stressed with the patient.  Importance of compliance with diet medication stressed and she vocalized understanding.  She was advised to walk at least half an hour a day 5 days a week and she promises to do so. Mixed dyslipidemia: Diet was emphasized.  Lifestyle modification urged and she promises to do better.  Lipids were reviewed. Post permanent pacemaker: Followed by primary care.  Pacemaker evaluation records were noted. Post cerebral aneurysm embolization followed by cardiology colleagues and records were reviewed. Patient will be seen in follow-up appointment in 6 months or earlier if the patient has any concerns    Medication Adjustments/Labs and Tests Ordered: Current medicines are reviewed at length with the patient today.  Concerns regarding medicines are outlined above.  No orders of the defined types were placed in this encounter.  No orders of the defined types were placed in this encounter.    No chief complaint on file.    History of Present Illness:    Leslie Franklin is a 60 y.o. female.  Patient has past medical history of cerebral artery aneurysm, carotid atherosclerosis, complete heart block post permanent pacemaker.  Patient denies any problems at this time and takes care of activities of daily living.  No chest pain orthopnea or PND.  She recently had embolization of her cerebral aneurysm followed by our interventional radiology colleagues.  She denies any chest pain orthopnea or PND.  She  takes care of activities of daily living.  At the time of my evaluation, the patient is alert awake oriented and in no distress.  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   Screening mammogram for breast cancer 09/12/2020   Stroke (HCC)    Syncope 09/12/2020    Past Surgical History:  Procedure Laterality Date   INSERT / REPLACE / REMOVE PACEMAKER     IR ANGIO INTRA EXTRACRAN SEL COM CAROTID INNOMINATE BILAT MOD SED  04/07/2021   IR ANGIO INTRA EXTRACRAN SEL INTERNAL CAROTID UNI R MOD SED  04/21/2021   IR ANGIO VERTEBRAL SEL SUBCLAVIAN INNOMINATE BILAT MOD SED  04/07/2021   IR ANGIOGRAM FOLLOW UP STUDY  04/21/2021   IR ANGIOGRAM FOLLOW UP STUDY  04/21/2021   IR CT HEAD LTD  04/21/2021   IR RADIOLOGIST EVAL & MGMT  03/12/2021   IR RADIOLOGIST EVAL & MGMT  05/07/2021   IR TRANSCATH/EMBOLIZ  04/21/2021   NO PAST SURGERIES  PACEMAKER IMPLANT N/A 10/21/2020   Procedure: PACEMAKER IMPLANT;  Surgeon: Marinus Maw, MD;  Location: [facility] INVASIVE CV LAB;  Service: Cardiovascular;  Laterality: N/A;   RADIOLOGY WITH ANESTHESIA N/A 04/07/2021   Procedure: EMBOLIZATION;  Surgeon: Julieanne Cotton, MD;  Location: [facility] OR;  Service: Radiology;  Laterality: N/A;   RADIOLOGY WITH ANESTHESIA N/A 04/21/2021   Procedure: RADIOLOGY WITH ANESTHESIA EMBOLIZATION;  Surgeon: Julieanne Cotton, MD;  Location: [facility] OR;  Service: Radiology;  Laterality: N/A;    Current Medications: Current Meds   Medication Sig   aspirin 81 MG chewable tablet Chew 81 mg by mouth daily.   Budeson-Glycopyrrol-Formoterol (BREZTRI AEROSPHERE) 160-9-4.8 MCG/ACT AERO Inhale 2 puffs into the lungs 2 (two) times daily.   busPIRone (BUSPAR) 10 MG tablet Take 1 tablet (10 mg total) by mouth 3 (three) times daily.   clopidogrel (PLAVIX) 75 MG tablet Take 0.5 tablets (37.5 mg total) by mouth daily. Take half tablet on Tuesday, Thursday, and Saturday.   hydrOXYzine (ATARAX/VISTARIL) 25 MG tablet Take 1 tablet (25 mg total) by mouth 3 (three) times daily as needed for anxiety.     Allergies:   Patient has no known allergies.   Social History   Socioeconomic History   Marital status: Single    Spouse name: Not on file   Number of children: 4   Years of education: Not on file   Highest education level: Not on file  Occupational History   Not on file  Tobacco Use   Smoking status: Former    Packs/day: 1.00    Years: 41.00    Pack years: 41.00    Types: Cigarettes    Quit date: 08/31/2020    Years since quitting: 0.8   Smokeless tobacco: Never  Vaping Use   Vaping Use: Never used  Substance and Sexual Activity   Alcohol use: Never   Drug use: Never   Sexual activity: Not on file  Other Topics Concern   Not on file  Social History Narrative   Not on file   Social Determinants of Health   Financial Resource Strain: Not on file  Food Insecurity: Not on file  Transportation Needs: Not on file  Physical Activity: Not on file  Stress: Not on file  Social Connections: Not on file     Family History: The patient's family history includes Diabetes in her mother; Emphysema in her sister; Heart attack in her father; Hypertension in her mother; Stroke in her brother and father.  ROS:   Please see the history of present illness.    All other systems reviewed and are negative.  EKGs/Labs/Other Studies Reviewed:    The following studies were reviewed today: I discussed my findings with the patient  at length   Recent Labs: 09/13/2020: TSH 1.220 03/27/2021: ALT 10 04/22/2021: BUN 9; Creatinine, Ser 0.88; Hemoglobin 11.3; Platelets 224; Potassium 4.1; Sodium 137  Recent Lipid Panel    Component Value Date/Time   CHOL 154 03/27/2021 1000   TRIG 87 03/27/2021 1000   HDL 49 03/27/2021 1000   CHOLHDL 3.1 03/27/2021 1000   LDLCALC 89 03/27/2021 1000    Physical Exam:    VS:  BP 116/72   Pulse 74   Ht 5\' 3"  (1.6 m)   Wt 113 lb (51.3 kg)   SpO2 97%   BMI 20.02 kg/m     Wt Readings from Last 3 Encounters:  07/02/21 113 lb (51.3 kg)  05/06/21 112 lb (50.8 kg)  04/21/21 115 lb (52.2  kg)     GEN: Patient is in no acute distress HEENT: Normal NECK: No JVD; No carotid bruits LYMPHATICS: No lymphadenopathy CARDIAC: Hear sounds regular, 2/6 systolic murmur at the apex. RESPIRATORY:  Clear to auscultation without rales, wheezing or rhonchi  ABDOMEN: Soft, non-tender, non-distended MUSCULOSKELETAL:  No edema; No deformity  SKIN: Warm and dry NEUROLOGIC:  Alert and oriented x 3 PSYCHIATRIC:  Normal affect   Signed, Garwin Brothers, MD  07/02/2021 9:01 AM    [facility]

## 2021-07-02 NOTE — Patient Instructions (Signed)

## 2021-07-08 ENCOUNTER — Telehealth: Payer: Self-pay

## 2021-07-08 ENCOUNTER — Ambulatory Visit (INDEPENDENT_AMBULATORY_CARE_PROVIDER_SITE_OTHER): Payer: 59 | Admitting: Nurse Practitioner

## 2021-07-08 ENCOUNTER — Other Ambulatory Visit: Payer: Self-pay

## 2021-07-08 ENCOUNTER — Encounter: Payer: Self-pay | Admitting: Nurse Practitioner

## 2021-07-08 VITALS — BP 118/64 | HR 99 | Temp 96.2°F | Ht 63.0 in | Wt 112.0 lb

## 2021-07-08 DIAGNOSIS — F411 Generalized anxiety disorder: Secondary | ICD-10-CM

## 2021-07-08 DIAGNOSIS — F321 Major depressive disorder, single episode, moderate: Secondary | ICD-10-CM | POA: Diagnosis not present

## 2021-07-08 MED ORDER — BUSPIRONE HCL 10 MG PO TABS: 10.0000 mg | ORAL_TABLET | Freq: Three times a day (TID) | ORAL | 1 refills | Status: DC

## 2021-07-08 MED ORDER — ESCITALOPRAM OXALATE 10 MG PO TABS: 10.0000 mg | ORAL_TABLET | Freq: Every day | ORAL | 1 refills | Status: DC

## 2021-07-08 NOTE — Patient Instructions (Signed)
Increase Buspar 10 mg to three times daily  Follow-up in 22-months, fasting   Managing Anxiety, Adult After being diagnosed with anxiety, you may be relieved to know why you have felt or behaved a certain way. You may also feel overwhelmed about the treatment ahead and what it will mean for your life. With care and support, you can manage this condition. How to manage lifestyle changes Managing stress and anxiety Stress is your body's reaction to life changes and events, both good and bad. Most stress will last just a few hours, but stress can be ongoing and can lead to more than just stress. Although stress can play a major role in anxiety, it is not the same as anxiety. Stress is usually caused by something external, such as a deadline, test, or competition. Stress normally passes after the triggering event has ended.  Anxiety is caused by something internal, such as imagining a terrible outcome or worrying that something will go wrong that will devastate you. Anxiety often does not go away even after the triggering event is over, and it can become long-term (chronic) worry. It is important to understand the differences between stress and anxiety and to manage your stress effectively so that it does not lead to an anxious response. Talk with your health care provider or a counselor to learn more about reducing anxiety and stress. He or she may suggest tension reduction techniques, such as: Music therapy. Spend time creating or listening to music that you enjoy and that inspires you. Mindfulness-based meditation. Practice being aware of your normal breaths while not trying to control your breathing. It can be done while sitting or walking. Centering prayer. This involves focusing on a word, phrase, or sacred image that means something to you and brings you peace. Deep breathing. To do this, expand your stomach and inhale slowly through your nose. Hold your breath for 3-5 seconds. Then exhale slowly,  letting your stomach muscles relax. Self-talk. Learn to notice and identify thought patterns that lead to anxiety reactions and change those patterns to thoughts that feel peaceful. Muscle relaxation. Taking time to tense muscles and then relax them. Choose a tension reduction technique that fits your lifestyle and personality. These techniques take time and practice. Set aside 5-15 minutes a day to do them. Therapists can offer counseling and training in these techniques. The training to help with anxiety may be covered by some insurance plans. Other things you can do to manage stress and anxiety include: Keeping a stress diary. This can help you learn what triggers your reaction and then learn ways to manage your response. Thinking about how you react to certain situations. You may not be able to control everything, but you can control your response. Making time for activities that help you relax and not feeling guilty about spending your time in this way. Doing visual imagery. This involves imagining or creating mental pictures to help you relax. Practicing yoga. Through yoga poses, you can lower tension and promote relaxation.  Medicines Medicines can help ease symptoms. Medicines for anxiety include: Antidepressant medicines. These are usually prescribed for long-term daily control. Anti-anxiety medicines. These may be added in severe cases, especially when panic attacks occur. Medicines will be prescribed by a health care provider. When used together, medicines, psychotherapy, and tension reduction techniques may be the most effective treatment. Relationships Relationships can play a big part in helping you recover. Try to spend more time connecting with trusted friends and family members. Consider going to  couples counseling if you have a partner, taking family education classes, or going to family therapy. Therapy can help you and others better understand your condition. How to recognize  changes in your anxiety Everyone responds differently to treatment for anxiety. Recovery from anxiety happens when symptoms decrease and stop interfering with your daily activities at home or work. This may mean that you will start to: Have better concentration and focus. Worry will interfere less in your daily thinking. Sleep better. Be less irritable. Have more energy. Have improved memory. It is also important to recognize when your condition is getting worse. Contact your health care provider if your symptoms interfere with home or work and you feel like your condition is not improving. Follow these instructions at home: Activity Exercise. Adults should do the following: Exercise for at least 150 minutes each week. The exercise should increase your heart rate and make you sweat (moderate-intensity exercise). Strengthening exercises at least twice a week. Get the right amount and quality of sleep. Most adults need 7-9 hours of sleep each night. Lifestyle  Eat a healthy diet that includes plenty of vegetables, fruits, whole grains, low-fat dairy products, and lean protein. Do not eat a lot of foods that are high in fats, added sugars, or salt (sodium). Make choices that simplify your life. Do not use any products that contain nicotine or tobacco. These products include cigarettes, chewing tobacco, and vaping devices, such as e-cigarettes. If you need help quitting, ask your health care provider. Avoid caffeine, alcohol, and certain over-the-counter cold medicines. These may make you feel worse. Ask your pharmacist which medicines to avoid. General instructions Take over-the-counter and prescription medicines only as told by your health care provider. Keep all follow-up visits. This is important. Where to find support You can get help and support from these sources: Self-help groups. Online and [employer]. A trusted spiritual leader. Couples counseling. Family education  classes. Family therapy. Where to find more information You may find that joining a support group helps you deal with your anxiety. The following sources can help you locate counselors or support groups near you: Mental Health America: www.mentalhealthamerica.net Anxiety and Depression Association of [place] (ADAA): [employer].de The [employer] American on Mental Illness (NAMI): www.nami.org Contact a health care provider if: You have a hard time staying focused or finishing daily tasks. You spend many hours a day feeling worried about everyday life. You become exhausted by worry. You start to have headaches or frequently feel tense. You develop chronic nausea or diarrhea. Get help right away if: You have a racing heart and shortness of breath. You have thoughts of hurting yourself or others. If you ever feel like you may hurt yourself or others, or have thoughts about taking your own life, get help right away. Go to your nearest emergency department or: Call your local emergency services (911 in the [place] Call a suicide crisis helpline, such as the National Suicide Prevention Lifeline at 9857157416 or 988 in the [place] This is open 24 hours a day in the [place] Text the Crisis Text Line at (573)166-1941 (in the [place] Summary Taking steps to learn and use tension reduction techniques can help calm you and help prevent triggering an anxiety reaction. When used together, medicines, psychotherapy, and tension reduction techniques may be the most effective treatment. Family, friends, and partners can play a big part in supporting you. This information is not intended to replace advice given to you by your health care provider. Make sure you discuss any questions  you have with your health care provider. Document Revised: 03/12/2021 Document Reviewed: 12/08/2020 Elsevier Patient Education  2022 Elsevier Inc. Managing Depression, Adult Depression is a mental health condition that affects your thoughts,  feelings, and actions. Being diagnosed with depression can bring you relief if you did not know why you have felt or behaved a certain way. It could also leave you feeling overwhelmed with uncertainty about your future. Preparing yourself to manage your symptoms can help you feel more positive about your future. How to manage lifestyle changes Managing stress Stress is your body's reaction to life changes and events, both good and bad. Stress can add to your feelings of depression. Learning to manage your stress can help lessen your feelings of depression. Try some of the following approaches to reducing your stress (stress reduction techniques): Listen to music that you enjoy and that inspires you. Try using a meditation app or take a meditation class. Develop a practice that helps you connect with your spiritual self. Walk in nature, pray, or go to a place of worship. Do some deep breathing. To do this, inhale slowly through your nose. Pause at the top of your inhale for a few seconds and then exhale slowly, letting your muscles relax. Practice yoga to help relax and work your muscles. Choose a stress reduction technique that suits your lifestyle and personality. These techniques take time and practice to develop. Set aside 5-15 minutes a day to do them. Therapists can offer training in these techniques. Other things you can do to manage stress include: Keeping a stress diary. Knowing your limits and saying no when you think something is too much. Paying attention to how you react to certain situations. You may not be able to control everything, but you can change your reaction. Adding humor to your life by watching funny films or TV shows. Making time for activities that you enjoy and that relax you.  Medicines Medicines, such as antidepressants, are often a part of treatment for depression. Talk with your pharmacist or health care provider about all the medicines, supplements, and herbal  products that you take, their possible side effects, and what medicines and other products are safe to take together. Make sure to report any side effects you may have to your health care provider. Relationships Your health care provider may suggest family therapy, couples therapy, or individual therapy as part of your treatment. How to recognize changes Everyone responds differently to treatment for depression. As you recover from depression, you may start to: Have more interest in doing activities. Feel less hopeless. Have more energy. Overeat less often, or have a better appetite. Have better mental focus. It is important to recognize if your depression is not getting better or is getting worse. The symptoms you had in the beginning may return, such as: Tiredness (fatigue) or low energy. Eating too much or too little. Sleeping too much or too little. Feeling restless, agitated, or hopeless. Trouble focusing or making decisions. Unexplained physical complaints. Feeling irritable, angry, or aggressive. If you or your family members notice these symptoms coming back, let your health care provider know right away. Follow these instructions at home: Activity  Try to get some form of exercise each day, such as walking, biking, swimming, or lifting weights. Practice stress reduction techniques. Engage your mind by taking a class or doing some volunteer work. Lifestyle Get the right amount and quality of sleep. Cut down on using caffeine, tobacco, alcohol, and other potentially harmful substances. Eat a  healthy diet that includes plenty of vegetables, fruits, whole grains, low-fat dairy products, and lean protein. Do not eat a lot of foods that are high in solid fats, added sugars, or salt (sodium). General instructions Take over-the-counter and prescription medicines only as told by your health care provider. Keep all follow-up visits as told by your health care provider. This is  important. Where to find support Talking to others Friends and family members can be sources of support and guidance. Talk to trusted friends or family members about your condition. Explain your symptoms to them, and let them know that you are working with a health care provider to treat your depression. Tell friends and family members how they also can be helpful. Finances Find appropriate mental health providers that fit with your financial situation. Talk with your health care provider about options to get reduced prices on your medicines. Where to find more information You can find support in your area from: Anxiety and Depression Association of America (ADAA): www.adaa.org Mental Health America: www.mentalhealthamerica.net The First American on Mental Illness: www.nami.org Contact a health care provider if: You stop taking your antidepressant medicines, and you have any of these symptoms: Nausea. Headache. Light-headedness. Chills and body aches. Not being able to sleep (insomnia). You or your friends and family think your depression is getting worse. Get help right away if: You have thoughts of hurting yourself or others. If you ever feel like you may hurt yourself or others, or have thoughts about taking your own life, get help right away. Go to your nearest emergency department or: Call your local emergency services (911 in the [place] Call a suicide crisis helpline, such as the National Suicide Prevention Lifeline at 561-710-6424 or 988 in the [place] This is open 24 hours a day in the [place] Text the Crisis Text Line at 7275557404 (in the [place] Summary If you are diagnosed with depression, preparing yourself to manage your symptoms is a good way to feel positive about your future. Work with your health care provider on a management plan that includes stress reduction techniques, medicines (if applicable), therapy, and healthy lifestyle habits. Keep talking with your health care  provider about how your treatment is working. If you have thoughts about taking your own life, call a suicide crisis helpline or text a crisis text line. This information is not intended to replace advice given to you by your health care provider. Make sure you discuss any questions you have with your health care provider. Document Revised: 03/12/2021 Document Reviewed: 06/28/2019 Elsevier Patient Education  2022 [employer].

## 2021-07-08 NOTE — Telephone Encounter (Signed)
Patient informed, wanted to know is ok to be taking both medication?

## 2021-07-08 NOTE — Telephone Encounter (Signed)
Patient called and wanted refill on lexapro 10 mg, I see that it was discontinue. Please advise.

## 2021-07-08 NOTE — Progress Notes (Signed)
Subjective:  Patient ID: Leslie Franklin, female    DOB: 09/23/1960  Age: 60 y.o. MRN: [id number]  Chief Complaint  Patient presents with   Anxiety   Depression    HPI  Leslie Franklin is a 60 year old Caucasian female that presents for follow-up of depression and anxiety. She has experienced several health problems over the past year. In addition, her adult daughter has metastatic breast cancer. Lexapro had been discontinued. Pt has asked to resume medication. Leslie Franklin tells me she has applied for social security disability but has not heard back about a decision.    Depression, Follow-up  She  was last seen for this 4 weeks ago. Changes made at last visit include Continue buspar 10 mg TID. Reports she feels so much better with Buspar.  States she thought she was to only take it once a day.  She reports fair compliance with treatment. She is not having side effects.   She reports good tolerance of treatment. She feels she is Improved since last visit.  Depression screen [facility] 2/9 07/08/2021 05/06/2021 03/27/2021  Decreased Interest 1 3 0  Down, Depressed, Hopeless 0 1 3  PHQ - 2 Score 1 4 3   Altered sleeping 0 2 3  Tired, decreased energy 3 3 3   Change in appetite 3 3 3   Feeling bad or failure about yourself  0 0 1  Trouble concentrating 0 3 3  Moving slowly or fidgety/restless 0 3 2  Suicidal thoughts 0 0 0  PHQ-9 Score 7 18 18   Difficult doing work/chores Not difficult at all Extremely dIfficult Very difficult      Anxiety, Follow-up  She was last seen for anxiety 4 weeks ago. Changes made at last visit include Buspar 10 mg TID.   She reports fair compliance with treatment. She reports good tolerance of treatment. She is not having side effects.  She feels her anxiety is mild and Improved since last visit.  Symptoms: No chest pain No difficulty concentrating  No dizziness No fatigue  No feelings of losing control No insomnia  No irritable No palpitations  No panic attacks No racing  thoughts  No shortness of breath No sweating  No tremors/shakes    GAD-7 Results GAD-7 Generalized Anxiety Disorder Screening Tool 07/08/2021 06/03/2021 05/06/2021  1. Feeling Nervous, Anxious, or on Edge 1 1 3   2. Not Being Able to Stop or Control Worrying 0 1 3  3. Worrying Too Much About Different Things 1 1 3   4. Trouble Relaxing 3 1 3   5. Being So Restless it's Hard To Sit Still 0 0 3  6. Becoming Easily Annoyed or Irritable 3 3 3   7. Feeling Afraid As If Something Awful Might Happen 0 0 3  Total GAD-7 Score 8 7 21   Difficulty At Work, Home, or Getting  Along With Others? Somewhat difficult Very difficult Very difficult    PHQ-9 Scores PHQ9 SCORE ONLY 07/08/2021 05/06/2021 03/27/2021  PHQ-9 Total Score 7 18 18    GAD-7 Results GAD-7 Generalized Anxiety Disorder Screening Tool 06/03/2021 05/06/2021 03/27/2021  1. Feeling Nervous, Anxious, or on Edge 1 3 2   2. Not Being Able to Stop or Control Worrying 1 3 3   3. Worrying Too Much About Different Things 1 3 3   4. Trouble Relaxing 1 3 3   5. Being So Restless it's Hard To Sit Still 0 3 0  6. Becoming Easily Annoyed or Irritable 3 3 3   7. Feeling Afraid As If Something Awful Might Happen 0 3  1  Total GAD-7 Score 7 21 15   Difficulty At Work, Home, or Getting  Along With Others? Very difficult Very difficult Somewhat difficult    PHQ-9 Scores PHQ9 SCORE ONLY 07/08/2021 05/06/2021 03/27/2021  PHQ-9 Total Score 7 18 18     Current Outpatient Medications on File Prior to Visit  Medication Sig Dispense Refill   aspirin 81 MG chewable tablet Chew 81 mg by mouth daily.     Budeson-Glycopyrrol-Formoterol (BREZTRI AEROSPHERE) 160-9-4.8 MCG/ACT AERO Inhale 2 puffs into the lungs 2 (two) times daily. 10.7 g 0   busPIRone (BUSPAR) 10 MG tablet Take 1 tablet (10 mg total) by mouth 3 (three) times daily. 90 tablet 1   clopidogrel (PLAVIX) 75 MG tablet Take 0.5 tablets (37.5 mg total) by mouth daily. Take half tablet on Tuesday, Thursday, and Saturday. 30  tablet 1   hydrOXYzine (ATARAX/VISTARIL) 25 MG tablet Take 1 tablet (25 mg total) by mouth 3 (three) times daily as needed for anxiety. 30 tablet 0   rosuvastatin (CRESTOR) 20 MG tablet Take 1 tablet (20 mg total) by mouth daily. 90 tablet 1   No current facility-administered medications on file prior to visit.   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   Screening mammogram for breast cancer 09/12/2020   Stroke (HCC)    Syncope 09/12/2020   Past Surgical History:  Procedure Laterality Date   INSERT / REPLACE / REMOVE PACEMAKER     IR ANGIO INTRA EXTRACRAN SEL COM CAROTID INNOMINATE BILAT MOD SED  04/07/2021   IR ANGIO INTRA EXTRACRAN SEL INTERNAL CAROTID UNI R MOD SED  04/21/2021   IR ANGIO VERTEBRAL SEL SUBCLAVIAN INNOMINATE BILAT MOD SED  04/07/2021   IR ANGIOGRAM FOLLOW UP STUDY  04/21/2021   IR ANGIOGRAM FOLLOW UP STUDY  04/21/2021   IR CT HEAD LTD  04/21/2021   IR RADIOLOGIST EVAL & MGMT  03/12/2021   IR RADIOLOGIST EVAL & MGMT  05/07/2021   IR TRANSCATH/EMBOLIZ  04/21/2021   NO PAST SURGERIES     PACEMAKER IMPLANT N/A 10/21/2020   Procedure: PACEMAKER IMPLANT;  Surgeon: 04/23/2021, MD;  Location: [facility];  Service: Cardiovascular;  Laterality: N/A;   RADIOLOGY WITH ANESTHESIA N/A 04/07/2021   Procedure: EMBOLIZATION;  Surgeon: Marinus Maw, MD;  Location: [facility] OR;  Service: Radiology;  Laterality: N/A;   RADIOLOGY WITH ANESTHESIA N/A 04/21/2021    Procedure: RADIOLOGY WITH ANESTHESIA EMBOLIZATION;  Surgeon: Julieanne Cotton, MD;  Location: [facility] OR;  Service: Radiology;  Laterality: N/A;    Family History  Problem Relation Age of Onset   Hypertension Mother    Diabetes Mother    Heart attack Father    Stroke Father    Emphysema Sister    Stroke Brother    Social History   Socioeconomic History   Marital status: Single    Spouse name: Not on file   Number of children: 4   Years of education: Not on  file   Highest education level: Not on file  Occupational History   Not on file  Tobacco Use   Smoking status: Former    Packs/day: 1.00    Years: 41.00    Pack years: 41.00    Types: Cigarettes    Quit date: 08/31/2020    Years since quitting: 0.8   Smokeless tobacco: Never  Vaping Use   Vaping Use: Never used  Substance and Sexual Activity   Alcohol use: Never   Drug use: Never   Sexual activity: Not on file  Other Topics Concern   Not on file  Social History Narrative   Not on file   Social Determinants of Health   Financial Resource Strain: Not on file  Food Insecurity: Not on file  Transportation Needs: Not on file  Physical Activity: Not on file  Stress: Not on file  Social Connections: Not on file    Review of Systems  Constitutional:  Negative for chills, fatigue and fever.  HENT:  Negative for congestion, ear pain, postnasal drip, rhinorrhea, sinus pressure, sinus pain and sore throat.   Respiratory:  Negative for cough and shortness of breath.   Cardiovascular:  Negative for chest pain.    Objective:  Pulse 99   Temp (!) 96.2 F (35.7 C)   Ht 5\' 3"  (1.6 m)   Wt 112 lb (50.8 kg)   SpO2 100%   BMI 19.84 kg/m  BP 118/64   Pulse 99   Temp (!) 96.2 F (35.7 C)   Ht 5\' 3"  (1.6 m)   Wt 112 lb (50.8 kg)   SpO2 100%   BMI 19.84 kg/m    BP/Weight 07/08/2021 07/02/2021 05/06/2021  Systolic BP - 116 130  Diastolic BP - 72 76  Wt. (Lbs) 112 113 112  BMI 19.84 20.02 19.84    Physical  Exam Vitals reviewed.  Constitutional:      Appearance: Normal appearance.  Skin:    General: Skin is warm and dry.     Capillary Refill: Capillary refill takes less than 2 seconds.  Neurological:     General: No focal deficit present.     Mental Status: She is alert and oriented to person, place, and time.  Psychiatric:        Mood and Affect: Mood normal.        Behavior: Behavior normal.    Lab Results  Component Value Date   WBC 11.3 (H) 04/22/2021   HGB 11.3 (L) 04/22/2021   HCT 34.5 (L) 04/22/2021   PLT 224 04/22/2021   GLUCOSE 94 04/22/2021   CHOL 154 03/27/2021   TRIG 87 03/27/2021   HDL 49 03/27/2021   LDLCALC 89 03/27/2021   ALT 10 03/27/2021   AST 17 03/27/2021   NA 137 04/22/2021   K 4.1 04/22/2021   CL 109 04/22/2021   CREATININE 0.88 04/22/2021   BUN 9 04/22/2021   CO2 21 (L) 04/22/2021   TSH 1.220 09/13/2020   INR 1.0 04/21/2021      Assessment & Plan:   1. GAD (generalized anxiety disorder) - escitalopram (LEXAPRO) 10 MG tablet; Take 1 tablet (10 mg total) by mouth daily.  Dispense: 90 tablet; Refill: 1 -Buspar 10 mg TID; take 1 tablet by mouth three times daily  2. Depression, major, single episode, moderate (HCC) - escitalopram (LEXAPRO) 10 MG tablet; Take 1 tablet (10 mg total) by mouth daily.  Dispense: 90 tablet; Refill: 1    Increase Buspar 10 mg to  three times daily  Follow-up in 70-months, fasting  Follow-up: 52-months  An After Visit Summary was printed and given to the patient.  I, [name], NP, have reviewed all documentation for this visit. The documentation on 07/08/21 for the exam, diagnosis, procedures, and orders are all accurate and complete.   [name], NP [facility] [phone]

## 2021-07-09 NOTE — Telephone Encounter (Signed)
Patient made aware, verbalized understanding

## 2021-07-21 ENCOUNTER — Ambulatory Visit (INDEPENDENT_AMBULATORY_CARE_PROVIDER_SITE_OTHER): Payer: 59

## 2021-07-21 DIAGNOSIS — I442 Atrioventricular block, complete: Secondary | ICD-10-CM | POA: Diagnosis not present

## 2021-07-21 LAB — CUP PACEART REMOTE DEVICE CHECK
Date Time Interrogation Session: 20221121082848
Implantable Lead Implant Date: 20220221
Implantable Lead Implant Date: 20220221
Implantable Lead Location: 753859
Implantable Lead Location: 753860
Implantable Lead Model: 377171
Implantable Lead Model: 377171
Implantable Lead Serial Number: 8000187789
Implantable Lead Serial Number: 8000197459
Implantable Pulse Generator Implant Date: 20220221
Pulse Gen Model: 407145
Pulse Gen Serial Number: 70053960

## 2021-07-26 ENCOUNTER — Emergency Department (HOSPITAL_COMMUNITY)
Admission: EM | Admit: 2021-07-26 | Discharge: 2021-07-27 | Disposition: A | Discharge status: Home / Self Care | Payer: 59 | Attending: Emergency Medicine | Admitting: Emergency Medicine

## 2021-07-26 ENCOUNTER — Other Ambulatory Visit: Payer: Self-pay

## 2021-07-26 DIAGNOSIS — Z20822 Contact with and (suspected) exposure to covid-19: Secondary | ICD-10-CM | POA: Diagnosis not present

## 2021-07-26 DIAGNOSIS — Z7902 Long term (current) use of antithrombotics/antiplatelets: Secondary | ICD-10-CM | POA: Diagnosis not present

## 2021-07-26 DIAGNOSIS — Z79899 Other long term (current) drug therapy: Secondary | ICD-10-CM | POA: Insufficient documentation

## 2021-07-26 DIAGNOSIS — R059 Cough, unspecified: Secondary | ICD-10-CM | POA: Diagnosis present

## 2021-07-26 DIAGNOSIS — J449 Chronic obstructive pulmonary disease, unspecified: Secondary | ICD-10-CM | POA: Insufficient documentation

## 2021-07-26 DIAGNOSIS — Z87891 Personal history of nicotine dependence: Secondary | ICD-10-CM | POA: Insufficient documentation

## 2021-07-26 DIAGNOSIS — Z950 Presence of cardiac pacemaker: Secondary | ICD-10-CM | POA: Insufficient documentation

## 2021-07-26 DIAGNOSIS — Z7982 Long term (current) use of aspirin: Secondary | ICD-10-CM | POA: Insufficient documentation

## 2021-07-26 DIAGNOSIS — J101 Influenza due to other identified influenza virus with other respiratory manifestations: Secondary | ICD-10-CM

## 2021-07-26 MED ORDER — ONDANSETRON 4 MG PO TBDP
4.0000 mg | ORAL_TABLET | Freq: Once | ORAL | Status: AC
  Administered 2021-07-26: 4 mg via ORAL
  Filled 2021-07-26: qty 1

## 2021-07-26 NOTE — ED Provider Notes (Signed)
Emergency Medicine Provider Triage Evaluation Note  Kristalyn Bergstresser , a 60 y.o. female  was evaluated in triage.  Pt complains of nausea, vomiting, and overall feeling poorly.   Was around grandson recently who was sick with flu and thinks she got it.  Also reports cough and congestion.  No fever/chills.  Review of Systems  Positive: Cough, nausea, vomiting Negative: fever  Physical Exam  BP (!) 96/50 (BP Location: Left Arm)   Pulse (!) 102   Temp 97.9 F (36.6 C) (Oral)   Resp 14   Ht 5\' 3"  (1.6 m)   Wt 50.8 kg   SpO2 95%   BMI 19.84 kg/m   Gen:   Awake, no distress   Resp:  Normal effort  MSK:   Moves extremities without difficulty  Other:  Voice sounds hoarse, dry cough observed  Medical Decision Making  Medically screening exam initiated at 11:19 PM.  Appropriate orders placed.  Alyviah Crandle was informed that the remainder of the evaluation will be completed by another provider, this initial triage assessment does not replace that evaluation, and the importance of remaining in the ED until their evaluation is complete.  Cough, nausea, vomiting.  Recent flu exposure from grandson.  Will check labs, CXR, covid/flu screen.  Zofran ODT ordered.   [name], PA-C 07/26/21 2347    07/28/21, MD 07/27/21 [phone]

## 2021-07-26 NOTE — ED Triage Notes (Signed)
Pt c/o nausea and vomiting. Pt denies any abd pain.

## 2021-07-27 ENCOUNTER — Emergency Department (HOSPITAL_COMMUNITY): Payer: 59

## 2021-07-27 LAB — CBC WITH DIFFERENTIAL/PLATELET
Abs Immature Granulocytes: 0.03 10*3/uL (ref 0.00–0.07)
Basophils Absolute: 0 10*3/uL (ref 0.0–0.1)
Basophils Relative: 0 %
Eosinophils Absolute: 0 10*3/uL (ref 0.0–0.5)
Eosinophils Relative: 0 %
HCT: 42.6 % (ref 36.0–46.0)
Hemoglobin: 14.3 g/dL (ref 12.0–15.0)
Immature Granulocytes: 0 %
Lymphocytes Relative: 17 %
Lymphs Abs: 1.7 10*3/uL (ref 0.7–4.0)
MCH: 29.1 pg (ref 26.0–34.0)
MCHC: 33.6 g/dL (ref 30.0–36.0)
MCV: 86.6 fL (ref 80.0–100.0)
Monocytes Absolute: 0.9 10*3/uL (ref 0.1–1.0)
Monocytes Relative: 9 %
Neutro Abs: 7.2 10*3/uL (ref 1.7–7.7)
Neutrophils Relative %: 74 %
Platelets: 189 10*3/uL (ref 150–400)
RBC: 4.92 MIL/uL (ref 3.87–5.11)
RDW: 12.6 % (ref 11.5–15.5)
WBC: 9.9 10*3/uL (ref 4.0–10.5)
nRBC: 0 % (ref 0.0–0.2)

## 2021-07-27 LAB — COMPREHENSIVE METABOLIC PANEL
ALT: 19 U/L (ref 0–44)
AST: 53 U/L — ABNORMAL HIGH (ref 15–41)
Albumin: 3.6 g/dL (ref 3.5–5.0)
Alkaline Phosphatase: 73 U/L (ref 38–126)
Anion gap: 17 — ABNORMAL HIGH (ref 5–15)
BUN: 23 mg/dL — ABNORMAL HIGH (ref 6–20)
CO2: 19 mmol/L — ABNORMAL LOW (ref 22–32)
Calcium: 9 mg/dL (ref 8.9–10.3)
Chloride: 98 mmol/L (ref 98–111)
Creatinine, Ser: 1.48 mg/dL — ABNORMAL HIGH (ref 0.44–1.00)
GFR, Estimated: 41 mL/min — ABNORMAL LOW (ref >60)
Glucose, Bld: 104 mg/dL — ABNORMAL HIGH (ref 70–99)
Potassium: 3.6 mmol/L (ref 3.5–5.1)
Sodium: 134 mmol/L — ABNORMAL LOW (ref 135–145)
Total Bilirubin: 0.8 mg/dL (ref 0.3–1.2)
Total Protein: 6.9 g/dL (ref 6.5–8.1)

## 2021-07-27 LAB — RESP PANEL BY RT-PCR (FLU A&B, COVID) ARPGX2
Influenza A by PCR: POSITIVE — AB
Influenza B by PCR: NEGATIVE
SARS Coronavirus 2 by RT PCR: NEGATIVE

## 2021-07-27 MED ORDER — ONDANSETRON HCL 4 MG/2ML IJ SOLN
4.0000 mg | Freq: Once | INTRAMUSCULAR | Status: AC
  Administered 2021-07-27: 07:00:00 4 mg via INTRAVENOUS
  Filled 2021-07-27: qty 2

## 2021-07-27 MED ORDER — LACTATED RINGERS IV BOLUS
500.0000 mL | Freq: Once | INTRAVENOUS | Status: AC
  Administered 2021-07-27: 08:00:00 500 mL via INTRAVENOUS

## 2021-07-27 MED ORDER — LACTATED RINGERS IV BOLUS
1000.0000 mL | Freq: Once | INTRAVENOUS | Status: AC
  Administered 2021-07-27: 07:00:00 1000 mL via INTRAVENOUS

## 2021-07-27 MED ORDER — ONDANSETRON 8 MG PO TBDP: 8.0000 mg | ORAL_TABLET | Freq: Three times a day (TID) | ORAL | 0 refills | Status: DC | PRN

## 2021-07-27 NOTE — ED Provider Notes (Signed)
  Physical Exam  BP 123/68   Pulse 77   Temp 98.7 F (37.1 C) (Oral)   Resp 16   Ht 5\' 3"  (1.6 m)   Wt 50.8 kg   SpO2 98%   BMI 19.84 kg/m     ED Course/Procedures     Procedures  MDM    60 year old presenting dehydrated with the flu. Getting IVF, declined Tamiflu. Likely DC following fluids.  On reassessment, the patient was feeling symptomatically improved following IV fluid resuscitation.  Vitals stable, not tachycardic or tachypneic, not hypoxic on room air.  Overall well-appearing.  Stable for discharge.       41, MD 07/27/21 [phone]

## 2021-07-27 NOTE — ED Provider Notes (Signed)
[facility] EMERGENCY DEPARTMENT Provider Note   CSN: [id number] Arrival date & time: 07/26/21  2249     History Chief Complaint  Patient presents with   Emesis   Nausea    Leslie Franklin is a 60 y.o. female.  The history is provided by the patient and a relative.  Emesis Severity:  Moderate Timing:  Intermittent Progression:  Worsening Chronicity:  New Relieved by:  Nothing Worsened by:  Nothing Associated symptoms: cough and diarrhea   Associated symptoms: no fever   Patient with history of cerebral aneurysm, COPD presents with flulike illness.  Patient reports of the past 5 days she has had cough, congestion but no fevers.  She also reports vomiting and diarrhea.  No chest pain but she reports mild shortness of breath.  She reports sick contacts.  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   Screening mammogram for breast cancer 09/12/2020   Stroke (HCC)    Syncope 09/12/2020    Patient Active Problem List   Diagnosis Date Noted   Dyspnea 06/17/2021   Peripheral vascular disease (HCC) 06/17/2021   Stroke (HCC) 06/17/2021   Aneurysm (HCC) 04/21/2021   Brain aneurysm 04/21/2021   Cerebral aneurysm 04/07/2021   Carotid atherosclerosis 02/26/2021   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   Presence of permanent cardiac pacemaker 11/20/2020    Ex-smoker 09/18/2020   Chest pain of uncertain etiology 09/18/2020   Palpitations 09/18/2020   Cardiac murmur 09/18/2020   COPD (chronic obstructive pulmonary disease) (HCC)    Depression    Anxiety 09/13/2020   Claudication (HCC) 09/12/2020   Screening mammogram for breast cancer 09/12/2020   Hyperlipidemia 09/12/2020   Syncope 09/12/2020    Past Surgical History:  Procedure Laterality Date   INSERT / REPLACE / REMOVE PACEMAKER     IR ANGIO INTRA EXTRACRAN SEL COM CAROTID INNOMINATE BILAT MOD SED  04/07/2021   IR ANGIO INTRA EXTRACRAN SEL INTERNAL CAROTID UNI R MOD SED  04/21/2021   IR ANGIO VERTEBRAL SEL SUBCLAVIAN INNOMINATE BILAT MOD SED  04/07/2021   IR ANGIOGRAM FOLLOW UP STUDY  04/21/2021   IR ANGIOGRAM FOLLOW UP STUDY  04/21/2021   IR CT HEAD LTD  04/21/2021   IR RADIOLOGIST EVAL & MGMT  03/12/2021   IR RADIOLOGIST EVAL & MGMT  05/07/2021   IR TRANSCATH/EMBOLIZ  04/21/2021   NO PAST SURGERIES     PACEMAKER IMPLANT N/A 10/21/2020   Procedure: PACEMAKER IMPLANT;  Surgeon: Marinus Maw, MD;  Location: [facility] INVAS[facility];  Service: Cardiovascular;  Laterality: N/A;   RADIOLOGY WITH ANESTHESIA N/A 04/07/2021   Procedure: EMBOLIZATION;  Surgeon: Julieanne Cotton, MD;  Location: [facility] OR;  Service: Radiology;  Laterality: N/A;   RADIOLOGY WITH ANESTHESIA N/A 04/21/2021   Procedure: RADIOLOGY WITH ANESTHESIA EMBOLIZATION;  Surgeon: Julieanne Cotton, MD;  Location: [facility] OR;  Service:  Radiology;  Laterality: N/A;     OB History   No obstetric history on file.     Family History  Problem Relation Age of Onset   Hypertension Mother    Diabetes Mother    Heart attack Father    Stroke Father    Emphysema Sister    Stroke Brother     Social History   Tobacco Use   Smoking status: Former    Packs/day: 1.00    Years: 41.00    Pack years: 41.00    Types: Cigarettes    Quit date: 08/31/2020    Years since quitting: 0.9   Smokeless tobacco: Never  Vaping Use   Vaping Use: Never  used  Substance Use Topics   Alcohol use: Never   Drug use: Never    Home Medications Prior to Admission medications   Medication Sig Start Date End Date Taking? Authorizing Provider  aspirin 81 MG chewable tablet Chew 81 mg by mouth daily.   Yes [provider]  Budeson-Glycopyrrol-Formoterol (BREZTRI AEROSPHERE) 160-9-4.8 MCG/ACT AERO Inhale 2 puffs into the lungs 2 (two) times daily. 03/27/21  Yes [name], NP  busPIRone (BUSPAR) 10 MG tablet Take 1 tablet (10 mg total) by mouth 3 (three) times daily. 06/08/21  Yes [name], NP  clopidogrel (PLAVIX) 75 MG tablet Take 0.5 tablets (37.5 mg total) by mouth daily. Take half tablet on Tuesday, Thursday, and Saturday. Patient taking differently: Take 37.5 mg by mouth See admin instructions. 37.5 mg on  Tuesday, Thursday, and Saturday. 04/23/21  Yes Han, Aimee H, PA-C  escitalopram (LEXAPRO) 10 MG tablet Take 1 tablet (10 mg total) by mouth daily. 07/08/21  Yes [name], NP  hydrOXYzine (ATARAX/VISTARIL) 25 MG tablet Take 1 tablet (25 mg total) by mouth 3 (three) times daily as needed for anxiety. 01/14/21  Yes [name], NP  ondansetron (ZOFRAN-ODT) 8 MG disintegrating tablet Take 1 tablet (8 mg total) by mouth every 8 (eight) hours as needed. 07/27/21  Yes Zadie Rhine, MD  Pseudoephedrine-APAP-DM (TYLENOL FLU PO) Take 2 capsules by mouth every 6 (six) hours as needed (cold/flu symptoms).   Yes [provider]  rosuvastatin (CRESTOR) 20 MG tablet Take 1 tablet (20 mg total) by mouth daily. 03/27/21 07/27/21 Yes [name], NP    Allergies    Patient has no known allergies.  Review of Systems   Review of Systems  Constitutional:  Positive for appetite change. Negative for fever.  HENT:  Positive for congestion.   Respiratory:  Positive for cough and shortness of breath.   Cardiovascular:  Negative for chest pain.  Gastrointestinal:  Positive for diarrhea and vomiting.  All other  systems reviewed and are negative.  Physical Exam Updated Vital Signs BP 107/64   Pulse 85   Temp 98.7 F (37.1 C) (Oral)   Resp 17   Ht 1.6 m (5\' 3" )   Wt 50.8 kg   SpO2 93%   BMI 19.84 kg/m   Physical Exam CONSTITUTIONAL: Elderly, appears older than stated age HEAD: Normocephalic/atraumatic EYES: EOMI/PERRL ENMT: Mucous membranes moist, uvula Midline, no erythema or edema, no stridor NECK: supple no meningeal signs SPINE/BACK:entire spine nontender CV: S1/S2 noted, no murmurs/rubs/gallops noted LUNGS: Lungs are clear to auscultation bilaterally, no apparent distress ABDOMEN: soft, nontender NEURO: Pt is awake/alert/appropriate, moves all extremitiesx4.  No facial droop.   EXTREMITIES: pulses normal/equal, full ROM SKIN: warm, color normal PSYCH: no abnormalities of mood noted, alert and oriented  to situation  ED Results / Procedures / Treatments   Labs (all labs ordered are listed, but only abnormal results are displayed) Labs Reviewed  RESP PANEL BY RT-PCR (FLU A&B, COVID) ARPGX2 - Abnormal; Notable for the following components:      Result Value   Influenza A by PCR POSITIVE (*)    All other components within normal limits  COMPREHENSIVE METABOLIC PANEL - Abnormal; Notable for the following components:   Sodium 134 (*)    CO2 19 (*)    Glucose, Bld 104 (*)    BUN 23 (*)    Creatinine, Ser 1.48 (*)    AST 53 (*)    GFR, Estimated 41 (*)    Anion gap 17 (*)    All other components within normal limits  CBC WITH DIFFERENTIAL/PLATELET    EKG None  Radiology DG Chest 2 View  Result Date: 07/27/2021 CLINICAL DATA:  Nausea and vomiting. EXAM: CHEST - 2 VIEW COMPARISON:  October 21, 2020 FINDINGS: There is a dual lead AICD. The heart size and mediastinal contours are within normal limits. Moderate severity calcification of the aortic arch is noted. Both lungs are clear. The visualized skeletal structures are unremarkable. IMPRESSION: No active cardiopulmonary  disease. Electronically Signed   By: Aram Candela M.D.   On: 07/27/2021 00:13    Procedures Procedures   Medications Ordered in ED Medications  ondansetron (ZOFRAN) injection 4 mg (has no administration in time range)  lactated ringers bolus 1,000 mL (has no administration in time range)  ondansetron (ZOFRAN-ODT) disintegrating tablet 4 mg (4 mg Oral Given 07/26/21 2331)    ED Course  I have reviewed the triage vital signs and the nursing notes.  Pertinent labs & imaging results that were available during my care of the patient were reviewed by me and considered in my medical decision making (see chart for details).    MDM Rules/Calculators/A&P                           Patient presents with symptoms of influenza for the past 5 days.  Patient declines Tamiflu, and less likely to be effective since has been greater than 48 hours.  Patient is dehydrated due to recent vomiting as well as decreased oral intake.  We will give IV fluids and anticipate discharge Patient request prescription for Zofran at discharge Final Clinical Impression(s) / ED Diagnoses Final diagnoses:  Influenza A    Rx / DC Orders ED Discharge Orders          Ordered    ondansetron (ZOFRAN-ODT) 8 MG disintegrating tablet  Every 8 hours PRN        07/27/21 0606             Zadie Rhine, MD 07/27/21 [phone]

## 2021-07-27 NOTE — ED Notes (Signed)
Patient Alert and oriented to baseline. Stable and ambulatory to baseline. Patient verbalized understanding of the discharge instructions.  Patient belongings were taken by the patient.   

## 2021-07-29 NOTE — Progress Notes (Signed)
Remote pacemaker transmission.   

## 2021-08-06 NOTE — Progress Notes (Deleted)
NEUROLOGY FOLLOW UP OFFICE NOTE  [name] [id number]  Assessment/Plan:   1.   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  Two small age-indeterminate infarcts on brain CT (right frontal and right cerebellar hemisphere) 3.  Dizziness - description sounds like near-syncope, especially with detection of heart block, but she reports that (although improved) she still feels dizzy, so will evaluate for vertebrobasilar insufficiency 3.  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  Small left cavernous ICA aneurysm 5.  Hyperlipidemia 6.  History of tobacco abuse   1.  ***  Subjective:  Leslie Franklin is a 60 year old right-handed female with COPD, HLD, depression and history of complete heart block s/p PPM who follows up for cerebellar infarct.  UPDATE: MRI brain and MRA head and neck on 03/06/2021 personally reviewed.  MRI brain redemonstrated small chronic cortical/subcortical infarct within the right middle frontal gyrus and three chronic lacunar infarcts within the right cerebellar hemisphere but no acute abnormalities.  MRA of head and neck showed 10 x 7 mm aneurysm arising from the cavernous/paraclinoid right ICA and 1-2 mm aneurysm arising from the cavernous left ICA.  She was referred to endovascular radiology.  She underwent pipeline embolization of the large right cavernous/paraclinoid ICA aneurysm on 04/21/2021.  ***   HISTORY: She began experiencing recurrent dizziness and syncope for maybe 10 years but started becoming worse since 2018.  The dizziness feels like "getting up too fast".  It occurs when standing, moving or sitting.  It lasts a couple of minutes.  Usually will lay down.  It is associated with tunnel vision, palpitations, diaphoresis and feeling hot.  No nausea.  She sometimes feels like she is going to pass out and has actually passed out at least three times.  No postictal confusion, tongue/cheek laceration or  incontinence.  She has been evaluated by cardiology.  Echocardiogram on 10/14/2020 was unremarkable with EF 60-65% with no valvular abnormalities or PFO/IAS.  Cardiac monitor has recorded episodes of daytime complete heart block.  She had a pacemaker implanted on 10/21/2020.  To further evaluate symptoms, she had a CT head on 11/12/2020 which demonstrated age indeterminate small lacunar infarcts in the right frontal cortical regiion and right cerebellar hemisphere.  She was started on ASA 81mg  daily.  Carotid ultrasound on 01/06/2021 showed less than 50% stenosis in the bilateral carotid arteries and antegrade flow in the vertebral arteries bilaterally.  She was started on Crestor.  LDL from 12/25/2020 was 110 (down from 218 in January).  TSH from 09/13/2020 was 1.220.  Since the pacemaker, she has less dizzy spells but they still occur.  They occur daily.    PAST MEDICAL HISTORY: 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   Screening mammogram for breast cancer 09/12/2020   Stroke [facility])    Syncope 09/12/2020    MEDICATIONS: Current Outpatient Medications on File Prior to Visit  Medication Sig Dispense Refill   aspirin  81 MG chewable tablet Chew 81 mg by mouth daily.     Budeson-Glycopyrrol-Formoterol (BREZTRI AEROSPHERE) 160-9-4.8 MCG/ACT AERO Inhale 2 puffs into the lungs 2 (two) times daily. 10.7 g 0   busPIRone (BUSPAR) 10 MG tablet Take 1 tablet (10 mg total) by mouth 3  (three) times daily. 90 tablet 1   clopidogrel (PLAVIX) 75 MG tablet Take 0.5 tablets (37.5 mg total) by mouth daily. Take half tablet on Tuesday, Thursday, and Saturday. (Patient taking differently: Take 37.5 mg by mouth See admin instructions. 37.5 mg on  Tuesday, Thursday, and Saturday.) 30 tablet 1   escitalopram (LEXAPRO) 10 MG tablet Take 1 tablet (10 mg total) by mouth daily. 90 tablet 1   hydrOXYzine (ATARAX/VISTARIL) 25 MG tablet Take 1 tablet (25 mg total) by mouth 3 (three) times daily as needed for anxiety. 30 tablet 0   ondansetron (ZOFRAN-ODT) 8 MG disintegrating tablet Take 1 tablet (8 mg total) by mouth every 8 (eight) hours as needed. 8 tablet 0   Pseudoephedrine-APAP-DM (TYLENOL FLU PO) Take 2 capsules by mouth every 6 (six) hours as needed (cold/flu symptoms).     rosuvastatin (CRESTOR) 20 MG tablet Take 1 tablet (20 mg total) by mouth daily. 90 tablet 1   No current facility-administered medications on file prior to visit.    ALLERGIES: No Known Allergies  FAMILY HISTORY: Family History  Problem Relation Age of Onset   Hypertension Mother    Diabetes Mother    Heart attack Father    Stroke Father    Emphysema Sister    Stroke Brother       Objective:  *** General: No acute distress.  Patient appears ***-groomed.   Head:  Normocephalic/atraumatic Eyes:  Fundi examined but not visualized Neck: supple, no paraspinal tenderness, full range of motion Heart:  Regular rate and rhythm Lungs:  Clear to auscultation bilaterally Back: No paraspinal tenderness Neurological Exam: alert and oriented to person, place, and time.  Speech fluent and not dysarthric, language intact.  CN II-XII intact. Bulk and tone normal, muscle strength 5/5 throughout.  Sensation to light touch intact.  Deep tendon reflexes 2+ throughout, toes downgoing.  Finger to nose testing intact.  Gait normal, Romberg negative.   Shon Millet, DO  CC: ***

## 2021-08-07 ENCOUNTER — Ambulatory Visit: Payer: 59 | Admitting: Neurology

## 2021-09-05 ENCOUNTER — Telehealth: Payer: Self-pay

## 2021-09-05 DIAGNOSIS — I442 Atrioventricular block, complete: Secondary | ICD-10-CM

## 2021-09-05 NOTE — Telephone Encounter (Signed)
Encounter opened in error

## 2021-09-10 ENCOUNTER — Other Ambulatory Visit (HOSPITAL_COMMUNITY): Payer: Self-pay | Admitting: Radiology

## 2021-09-10 DIAGNOSIS — I671 Cerebral aneurysm, nonruptured: Secondary | ICD-10-CM

## 2021-09-15 ENCOUNTER — Encounter: Payer: Self-pay | Admitting: Student

## 2021-09-15 LAB — PLATELET INHIBITION P2Y12

## 2021-09-15 NOTE — Progress Notes (Unsigned)
Patient ID: Leslie Franklin, female   DOB: 06-11-1961, 61 y.o.   MRN: [id number]  Patient has been follow by series of P2Y12 to determine optimal dosage of Plavix  (P2Y12 97 on 04/22/21, 3 on 05/19/21.) Patient's most recent P2Y12 result (17) reviewed with Dr. Corliss Skains.  Patient to continue to take half pill (37.5 mg) Plavix everyday.  Please contact [facility] for questions and concerns.    Ronelle Michie H Todd Argabright PA-C 09/15/2021 1:19 PM

## 2021-09-16 ENCOUNTER — Telehealth (HOSPITAL_COMMUNITY): Payer: Self-pay | Admitting: Radiology

## 2021-09-16 NOTE — Telephone Encounter (Signed)
Pt called and left a VM asking for the results of her recent P2Y12 blood test. Sent message to Alwyn Ren, NP to get with Dev and discuss results and advice. JM

## 2021-09-17 ENCOUNTER — Telehealth: Payer: Self-pay | Admitting: Student

## 2021-09-17 NOTE — Telephone Encounter (Signed)
IR received request to call patient with recent P2Y12 results and plavix dosing. Per note written 09/15/21 by Lawernce Ion, patient is to continue taking 37.5 mg Plavix daily.   I called the patient and left her a voicemail message explaining her P2Y12 from 09/10/21 was 17 and that Dr. Corliss Skains wants her to continue taking 37.5 mg plavix daily. I left the number to the APP office at [facility] for her to call with any further questions.  Alwyn Ren, [place] [phone] 09/17/2021, 9:50 AM

## 2021-09-18 ENCOUNTER — Other Ambulatory Visit: Payer: Self-pay

## 2021-09-18 DIAGNOSIS — F411 Generalized anxiety disorder: Secondary | ICD-10-CM

## 2021-09-18 MED ORDER — BUSPIRONE HCL 10 MG PO TABS: 10.0000 mg | ORAL_TABLET | Freq: Three times a day (TID) | ORAL | 1 refills | Status: DC

## 2021-10-09 ENCOUNTER — Other Ambulatory Visit: Payer: Self-pay

## 2021-10-09 ENCOUNTER — Ambulatory Visit: Payer: Medicaid Other | Admitting: Nurse Practitioner

## 2021-10-09 ENCOUNTER — Encounter: Payer: Self-pay | Admitting: Nurse Practitioner

## 2021-10-09 VITALS — BP 108/68 | HR 81 | Temp 97.1°F | Ht 63.0 in | Wt 106.0 lb

## 2021-10-09 DIAGNOSIS — F321 Major depressive disorder, single episode, moderate: Secondary | ICD-10-CM

## 2021-10-09 DIAGNOSIS — Z950 Presence of cardiac pacemaker: Secondary | ICD-10-CM

## 2021-10-09 DIAGNOSIS — I729 Aneurysm of unspecified site: Secondary | ICD-10-CM

## 2021-10-09 DIAGNOSIS — I442 Atrioventricular block, complete: Secondary | ICD-10-CM | POA: Diagnosis not present

## 2021-10-09 DIAGNOSIS — F411 Generalized anxiety disorder: Secondary | ICD-10-CM | POA: Diagnosis not present

## 2021-10-09 DIAGNOSIS — E782 Mixed hyperlipidemia: Secondary | ICD-10-CM | POA: Diagnosis not present

## 2021-10-09 MED ORDER — HYDROXYZINE HCL 25 MG PO TABS: 25.0000 mg | ORAL_TABLET | Freq: Three times a day (TID) | ORAL | 1 refills | Status: DC | PRN

## 2021-10-09 MED ORDER — ESCITALOPRAM OXALATE 10 MG PO TABS: 10.0000 mg | ORAL_TABLET | Freq: Every day | ORAL | 1 refills | Status: DC

## 2021-10-09 MED ORDER — BUSPIRONE HCL 10 MG PO TABS: 10.0000 mg | ORAL_TABLET | Freq: Three times a day (TID) | ORAL | 1 refills | Status: DC

## 2021-10-09 NOTE — Progress Notes (Signed)
Established Patient Office Visit  Subjective:  Patient ID: Leslie Franklin, female    DOB: 20-Oct-1960  Age: 61 y.o. MRN: [id number]  CC:  Chief Complaint  Patient presents with   Anxiety   Depression    HPI Leslie Franklin is a 61 year old Caucasian female that presents for follow-up of hyperlipidemia, COPD, anxiety, and depression. She underwent embolization right ophthalmic artery aneurysm with coiling on 04/21/21 with Dr Estanislado Pandy. Pacemaker inserted 10/2020 due to 3rd degree heart block, followed by Dr Geraldo Pitter, cardiologist.  Previous CVA, followed by Dr Tomi Likens, neurologist.   Leslie Franklin is up-to-date on pap smear, and eye exam. States she has upcoming cataract surgery. Colonoscopy 2022 with polypectomy x 2 with Dr Melina Copa, repeat in 5 years. Mammogram due 11/2020.  She tells me today that her daughter's breast cancer is currently in remission.   Lipid/Cholesterol, Follow-up  Last lipid panel Other pertinent labs  Lab Results  Component Value Date   CHOL 154 03/27/2021   HDL 49 03/27/2021   LDLCALC 89 03/27/2021   TRIG 87 03/27/2021   CHOLHDL 3.1 03/27/2021   Lab Results  Component Value Date   ALT 19 07/26/2021   AST 53 (H) 07/26/2021   PLT 189 07/26/2021   TSH 1.220 09/13/2020     She was last seen for this 6 months ago.  Management since that visit includes Crestor 20 mg daily.     She reports excellent compliance with treatment. She is not having side effects.   Symptoms: No chest pain No chest pressure/discomfort  No dyspnea No lower extremity edema  No numbness or tingling of extremity No orthopnea  No palpitations No paroxysmal nocturnal dyspnea  No speech difficulty No syncope   Current diet: in general, a "healthy" diet   Current exercise: housecleaning  Depression, Follow-up  She  was last seen for this 6 months ago. Current treatment includes Lexapro 10 mg daily   She reports excellent compliance with treatment. She is not having side effects.   She  reports excellent tolerance of treatment. Current symptoms include: decreased appetite She feels she is Improved since last visit.  Depression screen [facility] 2/9 10/09/2021 07/08/2021 05/06/2021  Decreased Interest 0 1 3  Down, Depressed, Hopeless 0 0 1  PHQ - 2 Score 0 1 4  Altered sleeping 0 0 2  Tired, decreased energy 0 3 3  Change in appetite '3 3 3  ' Feeling bad or failure about yourself  0 0 0  Trouble concentrating 0 0 3  Moving slowly or fidgety/restless 0 0 3  Suicidal thoughts 0 0 0  PHQ-9 Score '3 7 18  ' Difficult doing work/chores Not difficult at all Not difficult at all Extremely dIfficult       Anxiety, Follow-up  She was last seen for anxiety 6 months ago. Current Buspar 10 mg TID   She reports excellent compliance with treatment. She reports excellent tolerance of treatment. She is not having side effects.   She feels her anxiety is mild and Improved since last visit.  Symptoms: No chest pain No difficulty concentrating  No dizziness No fatigue  No feelings of losing control No insomnia  No irritable No palpitations  No panic attacks No racing thoughts  No shortness of breath No sweating  No tremors/shakes    GAD-7 Results GAD-7 Generalized Anxiety Disorder Screening Tool 10/09/2021 07/08/2021 06/03/2021  1. Feeling Nervous, Anxious, or on Edge '1 1 1  ' 2. Not Being Able to Stop or Control Worrying 1 0 1  3. Worrying Too Much About Different Things '1 1 1  ' 4. Trouble Relaxing '1 3 1  ' 5. Being So Restless it's Hard To Sit Still 0 0 0  6. Becoming Easily Annoyed or Irritable '1 3 3  ' 7. Feeling Afraid As If Something Awful Might Happen 0 0 0  Total GAD-7 Score '5 8 7  ' Difficulty At Work, Home, or Getting  Along With Others? Not difficult at all Somewhat difficult Very difficult      Past Medical History:  Diagnosis Date   Aneurysm ([place]) 04/21/2021   Anxiety 09/13/2020   Brain aneurysm 04/21/2021   Cardiac murmur 09/18/2020   Carotid atherosclerosis 02/26/2021    Cerebral aneurysm 04/07/2021   Chest pain of uncertain etiology 15/40/0867   Claudication ([place]) 09/12/2020   COPD (chronic obstructive pulmonary disease) ([place])    Depression    Dyspnea    Ex-smoker 09/18/2020   Heart block AV complete ([place]) 11/20/2020   Hyperlipidemia    Palpitations 09/18/2020   Peripheral vascular disease ([place])    Presence of permanent cardiac pacemaker 11/20/2020   Screening mammogram for breast cancer 09/12/2020   Stroke ([place])    Syncope 09/12/2020    Past Surgical History:  Procedure Laterality Date   INSERT / REPLACE / REMOVE PACEMAKER     IR ANGIO INTRA EXTRACRAN SEL COM CAROTID INNOMINATE BILAT MOD SED  04/07/2021   IR ANGIO INTRA EXTRACRAN SEL INTERNAL CAROTID UNI R MOD SED  04/21/2021   IR ANGIO VERTEBRAL SEL SUBCLAVIAN INNOMINATE BILAT MOD SED  04/07/2021   IR ANGIOGRAM FOLLOW UP STUDY  04/21/2021   IR ANGIOGRAM FOLLOW UP STUDY  04/21/2021   IR CT HEAD LTD  04/21/2021   IR RADIOLOGIST EVAL & MGMT  03/12/2021   IR RADIOLOGIST EVAL & MGMT  05/07/2021   IR TRANSCATH/EMBOLIZ  04/21/2021   NO PAST SURGERIES     PACEMAKER IMPLANT N/A 10/21/2020   Procedure: PACEMAKER IMPLANT;  Surgeon: Evans Lance, MD;  Location: [place] [facility];  Service: Cardiovascular;  Laterality: N/A;   RADIOLOGY WITH ANESTHESIA N/A 04/07/2021   Procedure: EMBOLIZATION;  Surgeon: Luanne Bras, MD;  Location: [place];  Service: Radiology;  Laterality: N/A;   RADIOLOGY WITH ANESTHESIA N/A 04/21/2021   Procedure: RADIOLOGY WITH ANESTHESIA EMBOLIZATION;  Surgeon: Luanne Bras, MD;  Location: [place];  Service: Radiology;  Laterality: N/A;    Family History  Problem Relation Age of Onset   Hypertension Mother    Diabetes Mother    Heart attack Father    Stroke Father    Emphysema Sister    Stroke Brother     Social History   Socioeconomic History   Marital status: Single    Spouse name: Not on file   Number of children: 4   Years of education: Not on file   Highest education  level: Not on file  Occupational History   Not on file  Tobacco Use   Smoking status: Former    Packs/day: 1.00    Years: 41.00    Pack years: 41.00    Types: Cigarettes    Quit date: 08/31/2020    Years since quitting: 1.1   Smokeless tobacco: Never  Vaping Use   Vaping Use: Never used  Substance and Sexual Activity   Alcohol use: Never   Drug use: Never   Sexual activity: Not on file  Other Topics Concern   Not on file  Social History Narrative   Not on file   Social  Determinants of Health   Financial Resource Strain: Not on file  Food Insecurity: Not on file  Transportation Needs: Not on file  Physical Activity: Not on file  Stress: Not on file  Social Connections: Not on file  Intimate Partner Violence: Not on file    Outpatient Medications Prior to Visit  Medication Sig Dispense Refill   aspirin 81 MG chewable tablet Chew 81 mg by mouth daily.     Budeson-Glycopyrrol-Formoterol (BREZTRI AEROSPHERE) 160-9-4.8 MCG/ACT AERO Inhale 2 puffs into the lungs 2 (two) times daily. 10.7 g 0   busPIRone (BUSPAR) 10 MG tablet Take 1 tablet (10 mg total) by mouth 3 (three) times daily. 90 tablet 1   clopidogrel (PLAVIX) 75 MG tablet Take 0.5 tablets (37.5 mg total) by mouth daily. Take half tablet on Tuesday, Thursday, and Saturday. (Patient taking differently: Take 37.5 mg by mouth See admin instructions. 37.5 mg on  Tuesday, Thursday, and Saturday.) 30 tablet 1   escitalopram (LEXAPRO) 10 MG tablet Take 1 tablet (10 mg total) by mouth daily. 90 tablet 1   hydrOXYzine (ATARAX/VISTARIL) 25 MG tablet Take 1 tablet (25 mg total) by mouth 3 (three) times daily as needed for anxiety. 30 tablet 0   ondansetron (ZOFRAN-ODT) 8 MG disintegrating tablet Take 1 tablet (8 mg total) by mouth every 8 (eight) hours as needed. 8 tablet 0   Pseudoephedrine-APAP-DM (TYLENOL FLU PO) Take 2 capsules by mouth every 6 (six) hours as needed (cold/flu symptoms).     rosuvastatin (CRESTOR) 20 MG tablet Take  1 tablet (20 mg total) by mouth daily. 90 tablet 1   No facility-administered medications prior to visit.    No Known Allergies  ROS Review of Systems  Constitutional:  Negative for appetite change, fatigue and unexpected weight change.  HENT:  Negative for congestion, ear pain, rhinorrhea, sinus pressure, sinus pain and tinnitus.   Eyes:  Negative for pain.  Respiratory:  Negative for cough and shortness of breath.   Cardiovascular:  Negative for chest pain, palpitations and leg swelling.  Gastrointestinal:  Negative for abdominal pain, constipation, diarrhea, nausea and vomiting.  Endocrine: Negative for cold intolerance, heat intolerance, polydipsia, polyphagia and polyuria.  Genitourinary:  Negative for dysuria, frequency and hematuria.  Musculoskeletal:  Negative for arthralgias, back pain, joint swelling and myalgias.  Skin:  Negative for rash.  Allergic/Immunologic: Negative for environmental allergies.  Neurological:  Negative for dizziness and headaches.  Hematological:  Negative for adenopathy.  Psychiatric/Behavioral:  Negative for decreased concentration and sleep disturbance. The patient is not nervous/anxious.      Objective:    Physical Exam Constitutional:      Appearance: Normal appearance.  HENT:     Head: Normocephalic.     Right Ear: Tympanic membrane normal.     Left Ear: Tympanic membrane normal.     Nose: Nose normal.     Mouth/Throat:     Mouth: Mucous membranes are moist.  Neck:     Vascular: No carotid bruit.  Cardiovascular:     Rate and Rhythm: Normal rate and regular rhythm.     Pulses: Normal pulses.     Heart sounds: Normal heart sounds.  Pulmonary:     Effort: Pulmonary effort is normal.     Breath sounds: Normal breath sounds.  Abdominal:     General: Bowel sounds are normal.     Palpations: Abdomen is soft.     Tenderness: There is no abdominal tenderness. There is no guarding.  Musculoskeletal:  General: No swelling.   Skin:    General: Skin is warm and dry.     Capillary Refill: Capillary refill takes less than 2 seconds.  Neurological:     Mental Status: She is alert and oriented to person, place, and time.  Psychiatric:        Mood and Affect: Mood normal.        Behavior: Behavior normal.    BP 108/68    Pulse 81    Temp (!) 97.1 F (36.2 C)    Ht '5\' 3"'  (1.6 m)    Wt 106 lb (48.1 kg)    SpO2 99%    BMI 18.78 kg/m  Wt Readings from Last 3 Encounters:  10/09/21 106 lb (48.1 kg)  07/26/21 111 lb 15.9 oz (50.8 kg)  07/08/21 112 lb (50.8 kg)     Health Maintenance Due  Topic Date Due   HIV Screening  Never done   Hepatitis C Screening  Never done   TETANUS/TDAP  Never done   Zoster Vaccines- Shingrix (1 of 2) Never done   COVID-19 Vaccine (3 - Booster for Moderna series) 10/28/2020      Lab Results  Component Value Date   TSH 1.220 09/13/2020   Lab Results  Component Value Date   WBC 9.9 07/26/2021   HGB 14.3 07/26/2021   HCT 42.6 07/26/2021   MCV 86.6 07/26/2021   PLT 189 07/26/2021   Lab Results  Component Value Date   NA 134 (L) 07/26/2021   K 3.6 07/26/2021   CO2 19 (L) 07/26/2021   GLUCOSE 104 (H) 07/26/2021   BUN 23 (H) 07/26/2021   CREATININE 1.48 (H) 07/26/2021   BILITOT 0.8 07/26/2021   ALKPHOS 73 07/26/2021   AST 53 (H) 07/26/2021   ALT 19 07/26/2021   PROT 6.9 07/26/2021   ALBUMIN 3.6 07/26/2021   CALCIUM 9.0 07/26/2021   ANIONGAP 17 (H) 07/26/2021   EGFR 73 03/27/2021   Lab Results  Component Value Date   CHOL 154 03/27/2021   Lab Results  Component Value Date   HDL 49 03/27/2021   Lab Results  Component Value Date   LDLCALC 89 03/27/2021   Lab Results  Component Value Date   TRIG 87 03/27/2021   Lab Results  Component Value Date   CHOLHDL 3.1 03/27/2021       Assessment & Plan:   1. Mixed hyperlipidemia-well controlled - Lipid panel - Comprehensive metabolic panel - CBC with Differential/Platelet - VITAMIN D 25 Hydroxy (Vit-D  Deficiency, Fractures) -continue Crestor 20 mg  2. GAD (generalized anxiety disorder)-well controlled - busPIRone (BUSPAR) 10 MG tablet; Take 1 tablet (10 mg total) by mouth 3 (three) times daily.  Dispense: 90 tablet; Refill: 1 - escitalopram (LEXAPRO) 10 MG tablet; Take 1 tablet (10 mg total) by mouth daily.  Dispense: 90 tablet; Refill: 1 - hydrOXYzine (ATARAX) 25 MG tablet; Take 1 tablet (25 mg total) by mouth 3 (three) times daily as needed for anxiety.  Dispense: 90 tablet; Refill: 1 - TSH -continue Buspar 10 mg TID   3. Depression, major, single episode, moderate (HCC)-well controlled - escitalopram (LEXAPRO) 10 MG tablet; Take 1 tablet (10 mg total) by mouth daily.  Dispense: 90 tablet; Refill: 1 - TSH  4. Aneurysm (HCC)-stable  5. Heart block AV complete (HCC)-stable  6. Presence of permanent cardiac pacemaker-stable    Continue medications We will call you with lab results Fall precautions due to blood thinner Plavix and aspirin Follow-up in  62-month, fasting   Follow-up: 358-month   I, ShRip HarbourNP, have reviewed all documentation for this visit. The documentation on 10/09/21 for the exam, diagnosis, procedures, and orders are all accurate and complete.    Signed, ShRip HarbourNP

## 2021-10-09 NOTE — Progress Notes (Deleted)
Subjective:  Patient ID: Leslie Franklin, female    DOB: 09/18/1960  Age: 61 y.o. MRN: [id number]  Chief Complaint  Patient presents with   Anxiety   Depression    HPI   Anxiety, Follow-up  She was last seen for anxiety 3 months ago. Changes made at last visit include Hydroxyzine.   She reports excellent compliance with treatment. She reports good tolerance of treatment. She is not having side effects.   She feels her anxiety is mild and Unchanged since last visit.  Symptoms: No chest pain No difficulty concentrating  Yes dizziness No fatigue  No feelings of losing control No insomnia  Yes irritable No palpitations  No panic attacks No racing thoughts  No shortness of breath No sweating  No tremors/shakes    GAD-7 Results GAD-7 Generalized Anxiety Disorder Screening Tool 10/09/2021 07/08/2021 06/03/2021  1. Feeling Nervous, Anxious, or on Edge 1 1 1   2. Not Being Able to Stop or Control Worrying 1 0 1  3. Worrying Too Much About Different Things 1 1 1   4. Trouble Relaxing 1 3 1   5. Being So Restless it's Hard To Sit Still 0 0 0  6. Becoming Easily Annoyed or Irritable 1 3 3   7. Feeling Afraid As If Something Awful Might Happen 0 0 0  Total GAD-7 Score 5 8 7   Difficulty At Work, Home, or Getting  Along With Others? Not difficult at all Somewhat difficult Very difficult    PHQ-9 Scores PHQ9 SCORE ONLY 10/09/2021 07/08/2021 05/06/2021  PHQ-9 Total Score 3 7 18       Depression, Follow-up  She  was last seen for this 3 months ago. Changes made at last visit include Buspar and Lexapro.   She reports excellent compliance with treatment. She is not having side effects.   She reports excellent tolerance of treatment. Current symptoms include:  none She feels she is Improved since last visit.  Depression screen [facility] 2/9 10/09/2021 07/08/2021 05/06/2021  Decreased Interest 0 1 3  Down, Depressed, Hopeless 0 0 1  PHQ - 2 Score 0 1 4  Altered sleeping 0 0 2  Tired, decreased  energy 0 3 3  Change in appetite 3 3 3   Feeling bad or failure about yourself  0 0 0  Trouble concentrating 0 0 3  Moving slowly or fidgety/restless 0 0 3  Suicidal thoughts 0 0 0  PHQ-9 Score 3 7 18   Difficult doing work/chores Not difficult at all Not difficult at all Extremely dIfficult      Current Outpatient Medications on File Prior to Visit  Medication Sig Dispense Refill   aspirin 81 MG chewable tablet Chew 81 mg by mouth daily.     Budeson-Glycopyrrol-Formoterol (BREZTRI AEROSPHERE) 160-9-4.8 MCG/ACT AERO Inhale 2 puffs into the lungs 2 (two) times daily. 10.7 g 0   busPIRone (BUSPAR) 10 MG tablet Take 1 tablet (10 mg total) by mouth 3 (three) times daily. 90 tablet 1   clopidogrel (PLAVIX) 75 MG tablet Take 0.5 tablets (37.5 mg total) by mouth daily. Take half tablet on Tuesday, Thursday, and Saturday. (Patient taking differently: Take 37.5 mg by mouth See admin instructions. 37.5 mg on  Tuesday, Thursday, and Saturday.) 30 tablet 1   escitalopram (LEXAPRO) 10 MG tablet Take 1 tablet (10 mg total) by mouth daily. 90 tablet 1   hydrOXYzine (ATARAX/VISTARIL) 25 MG tablet Take 1 tablet (25 mg total) by mouth 3 (three) times daily as needed for anxiety. 30 tablet 0  ondansetron (ZOFRAN-ODT) 8 MG disintegrating tablet Take 1 tablet (8 mg total) by mouth every 8 (eight) hours as needed. 8 tablet 0   Pseudoephedrine-APAP-DM (TYLENOL FLU PO) Take 2 capsules by mouth every 6 (six) hours as needed (cold/flu symptoms).     rosuvastatin (CRESTOR) 20 MG tablet Take 1 tablet (20 mg total) by mouth daily. 90 tablet 1   No current facility-administered medications on file prior to visit.   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   Hyperlipidemia    Palpitations 09/18/2020   Peripheral vascular disease (HCC)    Presence of permanent cardiac pacemaker 11/20/2020   Screening mammogram for breast cancer 09/12/2020   Stroke (HCC)    Syncope 09/12/2020   Past Surgical History:  Procedure Laterality Date   INSERT / REPLACE / REMOVE PACEMAKER     IR ANGIO INTRA EXTRACRAN SEL COM CAROTID INNOMINATE BILAT MOD SED  04/07/2021   IR ANGIO INTRA EXTRACRAN SEL INTERNAL CAROTID UNI R MOD SED  04/21/2021   IR ANGIO VERTEBRAL SEL SUBCLAVIAN INNOMINATE BILAT MOD SED  04/07/2021   IR ANGIOGRAM FOLLOW UP STUDY  04/21/2021   IR ANGIOGRAM FOLLOW UP STUDY  04/21/2021   IR CT HEAD LTD  04/21/2021   IR RADIOLOGIST EVAL & MGMT  03/12/2021   IR RADIOLOGIST EVAL & MGMT  05/07/2021   IR TRANSCATH/EMBOLIZ  04/21/2021   NO PAST SURGERIES     PACEMAKER IMPLANT N/A 10/21/2020   Procedure: PACEMAKER IMPLANT;  Surgeon: Marinus Maw, MD;  Location: [facility];  Service: Cardiovascular;  Laterality: N/A;   RADIOLOGY WITH ANESTHESIA N/A 04/07/2021   Procedure: EMBOLIZATION;  Surgeon: Julieanne Cotton, MD;  Location: [facility] OR;  Service: Radiology;  Laterality: N/A;   RADIOLOGY WITH ANESTHESIA N/A 04/21/2021   Procedure: RADIOLOGY WITH ANESTHESIA EMBOLIZATION;  Surgeon: Julieanne Cotton, MD;  Location: [facility] OR;  Service: Radiology;  Laterality: N/A;    Family History  Problem Relation Age of Onset   Hypertension Mother    Diabetes Mother    Heart attack Father    Stroke Father    Emphysema Sister    Stroke Brother    Social History   Socioeconomic History   Marital status: Single    Spouse name: Not on file   Number of children: 4   Years of education: Not on file   Highest education level: Not on file  Occupational History   Not on file  Tobacco Use   Smoking status: Former    Packs/day: 1.00    Years:  41.00    Pack years: 41.00    Types: Cigarettes    Quit date: 08/31/2020    Years since quitting: 1.1   Smokeless tobacco: Never  Vaping Use   Vaping Use: Never used  Substance and Sexual Activity   Alcohol use: Never   Drug use: Never   Sexual activity: Not on file  Other Topics Concern   Not on file  Social History Narrative   Not on file   Social Determinants of Health   Financial Resource Strain: Not on  file  Food Insecurity: Not on file  Transportation Needs: Not on file  Physical Activity: Not on file  Stress: Not on file  Social Connections: Not on file    Review of Systems  Constitutional:  Negative for chills, fatigue and fever.  HENT:  Negative for congestion, ear pain, rhinorrhea and sore throat.   Respiratory:  Negative for cough and shortness of breath.   Cardiovascular:  Negative for chest pain.  Gastrointestinal:  Negative for abdominal pain, constipation, diarrhea, nausea and vomiting.  Genitourinary:  Negative for dysuria and urgency.  Musculoskeletal:  Negative for back pain and myalgias.  Neurological:  Negative for dizziness, weakness, light-headedness and headaches.  Psychiatric/Behavioral:  Negative for dysphoric mood. The patient is not nervous/anxious.     Objective:  BP 108/68    Pulse 81    Temp (!) 97.1 F (36.2 C)    Ht 5\' 3"  (1.6 m)    Wt 106 lb (48.1 kg)    SpO2 99%    BMI 18.78 kg/m   BP/Weight 10/09/2021 07/27/2021 07/26/2021  Systolic BP 108 93 -  Diastolic BP 68 51 -  Wt. (Lbs) 106 - 111.99  BMI 18.78 - 19.84    Physical Exam  Diabetic Foot Exam - Simple   No data filed      Lab Results  Component Value Date   WBC 9.9 07/26/2021   HGB 14.3 07/26/2021   HCT 42.6 07/26/2021   PLT 189 07/26/2021   GLUCOSE 104 (H) 07/26/2021   CHOL 154 03/27/2021   TRIG 87 03/27/2021   HDL 49 03/27/2021   LDLCALC 89 03/27/2021   ALT 19 07/26/2021   AST 53 (H) 07/26/2021   NA 134 (L) 07/26/2021   K 3.6 07/26/2021   CL 98 07/26/2021    CREATININE 1.48 (H) 07/26/2021   BUN 23 (H) 07/26/2021   CO2 19 (L) 07/26/2021   TSH 1.220 09/13/2020   INR 1.0 04/21/2021      Assessment & Plan:   Problem List Items Addressed This Visit       Respiratory   COPD (chronic obstructive pulmonary disease) (HCC)     Other   Hyperlipidemia   Other Visit Diagnoses     GAD (generalized anxiety disorder)    -  Primary   Depression, major, single episode, moderate (HCC)         .  No orders of the defined types were placed in this encounter.   No orders of the defined types were placed in this encounter.    Follow-up: No follow-ups on file.  An After Visit Summary was printed and given to the patient.  04/23/2021, NP [facility] [phone]

## 2021-10-09 NOTE — Patient Instructions (Addendum)
Continue medications We will call you with lab results Fall precautions due to blood thinner Plavix and aspirin Follow-up in 34-month, fasting     Bleeding Precautions When on Anticoagulant Therapy, Adult Anticoagulant therapy, also called blood thinner therapy, is medicine that helps to prevent and treat blood clots. The medicine works by stopping blood clots from forming or growing. Blood clots that form in your blood vessels can be dangerous. They can break loose and travel to the heart, lungs, or brain. This increases the risk of a heart attack, stroke, or blocked lung artery (pulmonary embolism). Anticoagulants also increase the risk of bleeding. It is important to take anticoagulants exactly as told by your health care provider. Why do I need to be on anticoagulant therapy? You may need this medicine if you are at risk of developing a blood clot. Conditions that increase your risk of a blood clot include: Having an irregular heartbeat, such as atrial fibrillation (AF). Having had surgery, such as: A valve replacement. A hip or knee replacement. Having been born with a heart problem (congenital heart disease). Having certain types of cancer. Having certain diseases that can increase blood clotting. Having a high risk of stroke or heart attack. What are the common anticoagulant medicines? There are several types of anticoagulant medicines. They each work in different ways to prevent blood clots. The most common types are: Medicines that you take by mouth (oralmedicines), such as: Warfarin. Direct oral anticoagulants (DOACs), such as: Dabigatran. Apixaban, edoxaban, and rivaroxaban. Injections, such as: Heparin. Enoxaparin or fondaparinux. What do I need to remember while on anticoagulant therapy? Taking anticoagulants You may bleed more easily while taking anticoagulants. Be careful not to injure yourself. Take your medicine at the same time every day. If you forget to take your  medicine, take it as soon as you remember. Do not double your dosage of medicine if you miss a whole day. If it is almost time for your next dose, take only that dose and call your health care provider. Do not stop taking your medicine unless your health care provider approves. Stopping the medicine can increase your risk of developing a blood clot. You may need to have regular blood tests while you are taking anticoagulants. If you are taking warfarin, you will need a blood test to measure the time that it takes your blood to clot (INR test), as told by your health care provider. Taking other medicines Take over-the-counter and prescription medicines only as told by your health care provider. Talk with your health care provider before you take any medicines that contain aspirin or NSAIDs, such as ibuprofen. These medicines increase your risk for dangerous bleeding. Ask your health care provider before you start taking any new medicines, vitamins, herbs, or supplements because they could interfere with your therapy. General instructions If you are pregnant or trying to get pregnant, talk with a health care provider about anticoagulants. Some of these medicines are not safe to take during pregnancy. Tell all health care providers, including your dentist, that you are on anticoagulant therapy. It is especially important to tell providers before you have any surgery, medical procedures, or dental work done. Keep all follow-up visits. This is important. This includes blood tests. What precautions should I take?  Avoid situations that cause bleeding. Consider the following: Use a softer toothbrush. Floss with waxed, not unwaxed, dental floss. Shave with an electric razor, not a blade. Be careful when handling and using sharp objects. Wear gloves while you do yard  work. Avoid activities such as contact sports that may put you at risk for injury. Always wear your seat belt. Prevent falls by: Removing  loose rugs and extensions cords from areas where you walk. Using a cane or walker if you need it. Keeping a night-light on at night. Always wear shoes outdoors, and wear non-skid slippers indoors. Be careful when cutting your fingernails and toenails. Discuss ways to manage or avoid constipation with your health care provider. Place non-skid bath mats in the bathtub. If possible, install grab bars. What other precautions are important if on warfarin therapy? If you are taking a type of anticoagulant called warfarin, make sure you: Work with a Microbiologist to make an Location manager. Do not make any sudden changes to your diet without talking with your health care provider. Do not drink alcohol. It can interfere with your medicine and increase your risk of bleeding. Tell your health care provider if you stop smoking. Your warfarin dose may need to be decreased. Call your health care provider about any medicine changes, including if you start taking antibiotics for an infection, as this may affect your INR level. Get regular blood tests to check your INR level as told by your health care provider. What are some questions to ask my health care provider? What is the best anticoagulant therapy for my condition? How long will I need anticoagulant therapy? What are the side effects of anticoagulant therapy? When should I take my medicine? What should I do if I forget to take it? Will I need to have regular blood tests? Do I need to change my diet? Are there foods or drinks that I should avoid? What activities are safe for me? What should I do if I want to get pregnant? What should I do if I have an illness that causes vomiting or diarrhea? What should I do if I am unable to eat for a few days? Contact a health care provider if: You miss a dose of medicine and are not sure what to do. You have minor bleeding, such as nosebleeds or bleeding gums. You have: Menstrual bleeding that is heavier than  normal. Blood in your urine, or urine that is pink or brown. Easy bruising. Black and tarry stool (feces) or bright red stool. A rash or other skin reaction from the medicine. You become pregnant. Get help right away if: You have bleeding that will not stop after 20 minutes of holding pressure. You have a severe headache or stomachache. You have bloody vomit or you cough up blood, or your vomit looks like coffee grounds. You fall or hit your head. You have sudden numbness or weakness of the face, arm, or leg. You have trouble walking. You have confusion. These symptoms may represent a serious problem that is an emergency. Do not wait to see if the symptoms will go away. Get medical help right away. Call your local emergency services (911 in the [place] Do not drive yourself to the hospital. Summary Anticoagulant therapy, also called blood thinner therapy, is medicine that helps to prevent and treat blood clots. Anticoagulants can increase the risk of bleeding. It is important that you take anticoagulants exactly as told by your health care provider. Get help right away for bleeding that does not stop or if you fall and hit your head. Talk with your health care provider about any precautions that you should take while on anticoagulant therapy. Take over-the-counter and prescription medicines only as told by your health care provider.  This information is not intended to replace advice given to you by your health care provider. Make sure you discuss any questions you have with your health care provider. Document Revised: 10/14/2020 Document Reviewed: 10/14/2020 Elsevier Patient Education  [place] for Aging Adults Your bones do more than support your body. They also store calcium. The inside of your bones (marrow) makes blood cells. Maintaining bone health becomes more important as you age because your bones replace all their cells about every 10 years. Around  age 37, it gets harder to replace those cells, and bones can become weak. Weak bones can lead to osteoporosis and breaks (fractures). Falls also become more likely, which can cause fractures. The good news is that, with diet and exercise, you can improve and maintain your bone health at any age. How to eat for bone health A balanced diet can supply many of the vitamins, minerals, and proteins you need for bone health. Older adults need to make sure they get enough calcium, vitamin D, and magnesium. You may need more of these as you age. Calcium Calcium is the most important (essential) mineral for bone health. The daily requirement for calcium for adult men aged 60-70 years is 1,000 mg. For adult women aged 33-70 years, it is 1,200 mg. At age 69 or older, it is 1,200 mg for both men and women. Sources of calcium in your diet include: Dairy foods like milk, yogurt, and cheese. Dairy foods are the best sources. If you cannot eat dairy, you may need a calcium supplement. Leafy, dark green vegetables. These include collard greens, kale, broccoli, bok choy, and okra. Fatty fish, like sardines and canned salmon with bones. Almonds. Tofu.  Vitamin D You need vitamin D for bone health because it helps your body absorb calcium from your diet. It is not found naturally in many foods. Many people can benefit from taking a supplement. The daily requirement for vitamin D at age 12 or older is 600-1,000 international units (IU). Dietary sources for vitamin D include: Fatty fish, such as swordfish, salmon, sardine, and mackerel. Foods that have vitamin D added to them (are fortified), like cereal and dairy products. Egg yolks. Magnesium Magnesium helps your body use both calcium and vitamin D. The recommended daily intake for adult men is 400-420 mg. For adult women, it is 310-320 mg. Dietary sources of magnesium include: Green vegetables, such as collard greens, kale, bok choy, and okra. Poppy, sesame, and  chia seeds. Legumes, including peas and beans. Whole grains. Avocados. Nuts. If you drink alcohol regularly or take a type of antacid called a proton pump inhibitor, you might benefit from a magnesium supplement. How to exercise for bone health Exercise is important for bone health because it strengthens bones and muscles. Bones are living organs that get stronger when you exercise them, just like your heart and other muscles. Muscles support your bones and protect your joints. Strong muscles also help prevent bone loss and falls. Weight-bearing exercises and resistance exercises are the two types of exercise that are most important for bone health. Weight-bearing exercises include running or walking, climbing stairs, and playing sports like tennis. Resistance exercises include those done using free weights, weight machines, or resistance bands. Try to get at least 30 minutes of exercise every day. You can also include stretching, balance, and flexibility exercises, like yoga or tai chi. These lower your risk of falls. Follow these instructions at home Ask your health care provider if: You  need a bone density test. This is especially important for women older than 24 and men older than 70. You have been losing height. Loss of height may be a sign of weakening bones in your spine. Any of your medicines or medical conditions could affect your bone health. He or she could recommend an exercise program that is safe for you. Be sure it includes both weight-bearing and resistance exercises. Do not use any products that contain nicotine or tobacco. These products include cigarettes, chewing tobacco, and vaping devices, such as e-cigarettes. These can reduce bone density. If you need help quitting, ask your health care provider. Drink alcohol in moderation. Alcohol reduces bone density and increases your risk for falls. If you drink alcohol: Limit how much you have to: 0-1 drink a day for women who are  not pregnant. 0-2 drinks a day for men. Know how much alcohol is in a drink. In the [place] one drink equals one 12 oz bottle of beer (355 mL), one 5 oz glass of wine (148 mL), or one 1 oz glass of hard liquor (44 mL). Take over-the-counter and prescription medicines only as told by your health care provider. Ask your health care provider if you could benefit from taking calcium, vitamin D, or magnesium supplements. Keep all follow-up visits. This is important. Where to find more information American Bone Health: CardKnowledge.fi American Academy of Orthopaedic Surgeons: orthoinfo.[place]: bones.SouthEx[place].es Summary Maintaining your bone health becomes more important as you age. You can improve and maintain your bone health with diet and exercise at any age. A balanced diet can supply many of the vitamins, minerals, and proteins you need for bone health. Older adults need to make sure they get enough calcium, vitamin D, and magnesium. Ask your health care provider if you could benefit from taking calcium, vitamin D, or magnesium supplements. Exercise is important for bone health because it strengthens bones and muscles. Do both weight-bearing and resistance exercises. This information is not intended to replace advice given to you by your health care provider. Make sure you discuss any questions you have with your health care provider. Document Revised: 01/29/2021 Document Reviewed: 01/29/2021 Elsevier Patient Education  [place].

## 2021-10-10 LAB — COMPREHENSIVE METABOLIC PANEL
ALT: 10 IU/L (ref 0–32)
AST: 17 IU/L (ref 0–40)
Albumin/Globulin Ratio: 2 (ref 1.2–2.2)
Albumin: 4.5 g/dL (ref 3.8–4.9)
Alkaline Phosphatase: 102 IU/L (ref 44–121)
BUN/Creatinine Ratio: 15 (ref 9–23)
BUN: 14 mg/dL (ref 6–24)
Bilirubin Total: 0.3 mg/dL (ref 0.0–1.2)
CO2: 23 mmol/L (ref 20–29)
Calcium: 10 mg/dL (ref 8.7–10.2)
Chloride: 104 mmol/L (ref 96–106)
Creatinine, Ser: 0.91 mg/dL (ref 0.57–1.00)
Globulin, Total: 2.2 g/dL (ref 1.5–4.5)
Glucose: 83 mg/dL (ref 70–99)
Potassium: 4.7 mmol/L (ref 3.5–5.2)
Sodium: 142 mmol/L (ref 134–144)
Total Protein: 6.7 g/dL (ref 6.0–8.5)
eGFR: 73 mL/min/{1.73_m2} (ref >59)

## 2021-10-10 LAB — LIPID PANEL
Chol/HDL Ratio: 3 ratio (ref 0.0–4.4)
Cholesterol, Total: 159 mg/dL (ref 100–199)
HDL: 53 mg/dL (ref >39)
LDL Chol Calc (NIH): 87 mg/dL (ref 0–99)
Triglycerides: 103 mg/dL (ref 0–149)
VLDL Cholesterol Cal: 19 mg/dL (ref 5–40)

## 2021-10-10 LAB — CBC WITH DIFFERENTIAL/PLATELET
Basophils Absolute: 0.1 10*3/uL (ref 0.0–0.2)
Basos: 1 %
EOS (ABSOLUTE): 0.2 10*3/uL (ref 0.0–0.4)
Eos: 3 %
Hematocrit: 43.9 % (ref 34.0–46.6)
Hemoglobin: 14.3 g/dL (ref 11.1–15.9)
Immature Grans (Abs): 0 10*3/uL (ref 0.0–0.1)
Immature Granulocytes: 0 %
Lymphocytes Absolute: 2.9 10*3/uL (ref 0.7–3.1)
Lymphs: 35 %
MCH: 29 pg (ref 26.6–33.0)
MCHC: 32.6 g/dL (ref 31.5–35.7)
MCV: 89 fL (ref 79–97)
Monocytes Absolute: 0.5 10*3/uL (ref 0.1–0.9)
Monocytes: 7 %
Neutrophils Absolute: 4.6 10*3/uL (ref 1.4–7.0)
Neutrophils: 54 %
Platelets: 250 10*3/uL (ref 150–450)
RBC: 4.93 x10E6/uL (ref 3.77–5.28)
RDW: 13.2 % (ref 11.7–15.4)
WBC: 8.4 10*3/uL (ref 3.4–10.8)

## 2021-10-10 LAB — TSH: TSH: 2.15 u[IU]/mL (ref 0.450–4.500)

## 2021-10-10 LAB — CARDIOVASCULAR RISK ASSESSMENT

## 2021-10-10 LAB — VITAMIN D 25 HYDROXY (VIT D DEFICIENCY, FRACTURES): Vit D, 25-Hydroxy: 26.6 ng/mL — ABNORMAL LOW (ref 30.0–100.0)

## 2021-10-12 ENCOUNTER — Other Ambulatory Visit: Payer: Self-pay | Admitting: Nurse Practitioner

## 2021-10-12 DIAGNOSIS — E559 Vitamin D deficiency, unspecified: Secondary | ICD-10-CM

## 2021-10-12 MED ORDER — VITAMIN D (ERGOCALCIFEROL) 1.25 MG (50000 UNIT) PO CAPS
50000.0000 [IU] | ORAL_CAPSULE | ORAL | 2 refills | Status: DC
Start: 2021-10-12 — End: 2022-05-11

## 2021-10-13 ENCOUNTER — Other Ambulatory Visit: Payer: Self-pay

## 2021-10-20 ENCOUNTER — Ambulatory Visit (INDEPENDENT_AMBULATORY_CARE_PROVIDER_SITE_OTHER): Payer: Medicaid Other

## 2021-10-20 DIAGNOSIS — I442 Atrioventricular block, complete: Secondary | ICD-10-CM | POA: Diagnosis not present

## 2021-10-20 LAB — CUP PACEART REMOTE DEVICE CHECK
Date Time Interrogation Session: 20230220104804
Implantable Lead Implant Date: 20220221
Implantable Lead Implant Date: 20220221
Implantable Lead Location: 753859
Implantable Lead Location: 753860
Implantable Lead Model: 377171
Implantable Lead Model: 377171
Implantable Lead Serial Number: 8000187789
Implantable Lead Serial Number: 8000197459
Implantable Pulse Generator Implant Date: 20220221
Pulse Gen Model: 407145
Pulse Gen Serial Number: 70053960

## 2021-10-24 NOTE — Progress Notes (Signed)
Remote pacemaker transmission.   

## 2021-11-06 ENCOUNTER — Encounter: Payer: Self-pay | Admitting: Cardiology

## 2021-11-06 MED ORDER — CLOPIDOGREL BISULFATE 75 MG PO TABS: 37.5000 mg | ORAL_TABLET | Freq: Every day | ORAL | 1 refills | Status: DC

## 2021-11-06 NOTE — Telephone Encounter (Signed)
Per OV note on 11/22 via Dr Geraldo Pitter, he makes no medication changes for pt and does review recent embolization of cerebral aneurysm. Pt requesting refill for Plavix-dose =0.5tablet three times weekly. Will send in refill to pharmacy on file. ?

## 2021-11-10 NOTE — Progress Notes (Unsigned)
NEUROLOGY FOLLOW UP OFFICE NOTE  Leslie Franklin [id number]  Assessment/Plan:   1.  Two small age-indeterminate infarcts on brain CT (right frontal and right cerebellar hemisphere) 2.  Dizziness - description sounds like near-syncope, especially with detection of heart block, but she reports that (although improved) she still feels dizzy, so will evaluate for vertebrobasilar insufficiency 3.  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  Hyperlipidemia 5.  History of tobacco abuse   1.  Check MRI brain and MRA of head and neck 2.  Continue current medications for secondary stroke prevention: -  ASA 81mg  daily -  Crestor -  Normotensive blood pressure 3.  Follow up after testing.     Subjective:  Leslie Franklin is a 61 year old right-handed female with COPD, HLD, depression and history of complete heart block s/p PPM who follows up for dizziness and cerebellar infarcts.  UPDATE: Underwent imaging on 03/06/2021, personally reviewed.  MRI of brain without contrast showed mild chronic small vessel ischemic changes and redemonstrated small chronic cortical/subcortical infarct within the right middle frontal gyrus and three chronic lacunar nfarcts within the right cerebellar hemisphere but no acute abnormalities.  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  She was referred to interventional radiology and underwent endovascular treatment using pipeline flow diverter device   HISTORY: She began experiencing recurrent dizziness and syncope for maybe 10 years but started becoming worse since 2018.  The dizziness feels like "getting up too fast".  It occurs when standing, moving or sitting.  It lasts a couple of minutes.  Usually will lay down.  It is associated with tunnel vision, palpitations, diaphoresis and  feeling hot.  No nausea.  She sometimes feels like she is going to pass out and has actually passed out at least three times.  No postictal confusion, tongue/cheek laceration or incontinence.  She has been evaluated by cardiology.  Echocardiogram on 10/14/2020 was unremarkable with EF 60-65% with no valvular abnormalities or PFO/IAS.  Cardiac monitor has recorded episodes of daytime complete heart block.  She had a pacemaker implanted on 10/21/2020.  To further evaluate symptoms, she had a CT head on 11/12/2020 which demonstrated age indeterminate small lacunar infarcts in the right frontal cortical regiion and right cerebellar hemisphere.  She was started on ASA 81mg  daily.  Carotid ultrasound on 01/06/2021 showed less than 50% stenosis in the bilateral carotid arteries and antegrade flow in the vertebral arteries bilaterally.  She was started on Crestor.  LDL from 12/25/2020 was 110 (down from 218 in January).  TSH from 09/13/2020 was 1.220.  Since the pacemaker, she has less dizzy spells but they still occur.  They occur daily.    PAST MEDICAL HISTORY: 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   Screening  mammogram for breast cancer 09/12/2020   Stroke [facility])    Syncope 09/12/2020    MEDICATIONS: Current Outpatient Medications on File Prior to Visit  Medication Sig Dispense Refill   aspirin 81 MG chewable tablet Chew 81 mg by mouth daily.     Budeson-Glycopyrrol-Formoterol  (BREZTRI AEROSPHERE) 160-9-4.8 MCG/ACT AERO Inhale 2 puffs into the lungs 2 (two) times daily. 10.7 g 0   busPIRone (BUSPAR) 10 MG tablet Take 1 tablet (10 mg total) by mouth 3 (three) times daily. 90 tablet 1   clopidogrel (PLAVIX) 75 MG tablet Take 0.5 tablets (37.5 mg total) by mouth daily. Take 0.5 (half) tablet by mouth on Tuesdays, Thursdays, and Saturdays only. 30 tablet 1   escitalopram (LEXAPRO) 10 MG tablet Take 1 tablet (10 mg total) by mouth daily. 90 tablet 1   hydrOXYzine (ATARAX) 25 MG tablet Take 1 tablet (25 mg total) by mouth 3 (three) times daily as needed for anxiety. 90 tablet 1   ondansetron (ZOFRAN-ODT) 8 MG disintegrating tablet Take 1 tablet (8 mg total) by mouth every 8 (eight) hours as needed. 8 tablet 0   Pseudoephedrine-APAP-DM (TYLENOL FLU PO) Take 2 capsules by mouth every 6 (six) hours as needed (cold/flu symptoms).     rosuvastatin (CRESTOR) 20 MG tablet Take 1 tablet (20 mg total) by mouth daily. 90 tablet 1   Vitamin D, Ergocalciferol, (DRISDOL) 1.25 MG (50000 UNIT) CAPS capsule Take 1 capsule (50,000 Units total) by mouth every 7 (seven) days. 5 capsule 2   No current facility-administered medications on file prior to visit.    ALLERGIES: No Known Allergies  FAMILY HISTORY: Family History  Problem Relation Age of Onset   Hypertension Mother    Diabetes Mother    Heart attack Father    Stroke Father    Emphysema Sister    Stroke Brother       Objective:  *** General: No acute distress.  Patient appears ***-groomed.   Head:  Normocephalic/atraumatic Eyes:  Fundi examined but not visualized Neck: supple, no paraspinal tenderness, full range of motion Heart:  Regular rate and rhythm Lungs:  Clear to auscultation bilaterally Back: No paraspinal tenderness Neurological Exam: alert and oriented to person, place, and time.  Speech fluent and not dysarthric, language intact.  CN II-XII intact. Bulk and tone normal, muscle strength 5/5 throughout.   Sensation to light touch intact.  Deep tendon reflexes 2+ throughout, toes downgoing.  Finger to nose testing intact.  Gait normal, Romberg negative.   Shon Millet, DO  CC: ***

## 2021-11-11 ENCOUNTER — Encounter: Payer: Self-pay | Admitting: Neurology

## 2021-11-11 ENCOUNTER — Other Ambulatory Visit: Payer: Self-pay

## 2021-11-11 ENCOUNTER — Telehealth: Payer: Self-pay | Admitting: Neurology

## 2021-11-11 ENCOUNTER — Other Ambulatory Visit: Payer: Self-pay | Admitting: Nurse Practitioner

## 2021-11-11 ENCOUNTER — Ambulatory Visit: Payer: Medicaid Other | Admitting: Neurology

## 2021-11-11 VITALS — BP 128/78 | HR 93 | Ht 63.0 in | Wt 107.8 lb

## 2021-11-11 DIAGNOSIS — I442 Atrioventricular block, complete: Secondary | ICD-10-CM | POA: Diagnosis not present

## 2021-11-11 DIAGNOSIS — I671 Cerebral aneurysm, nonruptured: Secondary | ICD-10-CM

## 2021-11-11 DIAGNOSIS — R55 Syncope and collapse: Secondary | ICD-10-CM | POA: Diagnosis not present

## 2021-11-11 DIAGNOSIS — E782 Mixed hyperlipidemia: Secondary | ICD-10-CM

## 2021-11-11 DIAGNOSIS — R42 Dizziness and giddiness: Secondary | ICD-10-CM | POA: Diagnosis not present

## 2021-11-11 DIAGNOSIS — I639 Cerebral infarction, unspecified: Secondary | ICD-10-CM | POA: Diagnosis not present

## 2021-11-11 NOTE — Telephone Encounter (Signed)
Patient advised, From a stroke standpoint, she would be clear for surgery.  However, given that she is on antiplatelet therapy for the endovascular treatment, that clearance and recommendations will have to come from Dr. Corliss Skains . ? ? ?

## 2021-11-11 NOTE — Telephone Encounter (Signed)
Patient called asking if we had received a fax last week about her cataract eye surgery.  She cannot have her surgery until we get it. ?

## 2021-11-11 NOTE — Patient Instructions (Signed)
Continue current management

## 2021-11-17 ENCOUNTER — Encounter: Payer: Self-pay | Admitting: Radiology

## 2021-11-17 NOTE — Progress Notes (Unsigned)
Medical clearance sent to [facility] for patient's upcoming cataract surgery.  ?

## 2021-11-24 DIAGNOSIS — H25811 Combined forms of age-related cataract, right eye: Secondary | ICD-10-CM | POA: Diagnosis not present

## 2021-11-29 HISTORY — PX: CATARACT EXTRACTION BILATERAL W/ ANTERIOR VITRECTOMY: SHX1304

## 2021-12-08 ENCOUNTER — Other Ambulatory Visit: Payer: Self-pay | Admitting: Nurse Practitioner

## 2021-12-08 DIAGNOSIS — F411 Generalized anxiety disorder: Secondary | ICD-10-CM

## 2021-12-08 DIAGNOSIS — H25812 Combined forms of age-related cataract, left eye: Secondary | ICD-10-CM | POA: Diagnosis not present

## 2021-12-30 ENCOUNTER — Encounter: Payer: Self-pay | Admitting: Cardiology

## 2021-12-30 ENCOUNTER — Ambulatory Visit: Payer: Medicaid Other | Admitting: Cardiology

## 2021-12-30 VITALS — BP 126/64 | HR 96 | Ht 63.6 in | Wt 108.4 lb

## 2021-12-30 DIAGNOSIS — I442 Atrioventricular block, complete: Secondary | ICD-10-CM

## 2021-12-30 DIAGNOSIS — I6529 Occlusion and stenosis of unspecified carotid artery: Secondary | ICD-10-CM | POA: Diagnosis not present

## 2021-12-30 DIAGNOSIS — Z950 Presence of cardiac pacemaker: Secondary | ICD-10-CM | POA: Diagnosis not present

## 2021-12-30 NOTE — Progress Notes (Signed)
?Cardiology Office Note:   ? ?Date:  12/30/2021  ? ?ID:  Leslie Franklin, DOB 11-06-1960, MRN [id number] ? ?PCP:  Rip Harbour, NP  ?Cardiologist:  Jenean Lindau, MD  ? ?Referring MD: Rip Harbour, NP  ? ? ?ASSESSMENT:   ? ?1. Heart block AV complete ([place])   ?2. Presence of permanent cardiac pacemaker   ?3. Carotid atherosclerosis, unspecified laterality   ? ?PLAN:   ? ?In order of problems listed above: ? ?Primary prevention stressed to the patient.  Importance of compliance with diet and medication stressed and she vocalized understanding. ?Bilateral carotid atherosclerosis: Stable at this time.  On statin therapy.  Managed by primary care.  Lipids reviewed from [facility] sheet and found to be fine. ?Complete heart block post permanent pacemaker: I discussed this with her.  Electrophysiology colleagues follow her pacemaker evaluation and this is stable.  Her questions were answered to her satisfaction. ?She was advised to walk at least half an hour a day 5 days a week.Patient will be seen in follow-up appointment in 12 months or earlier if the patient has any concerns ? ? ? ?Medication Adjustments/Labs and Tests Ordered: ?Current medicines are reviewed at length with the patient today.  Concerns regarding medicines are outlined above.  ?No orders of the defined types were placed in this encounter. ? ?No orders of the defined types were placed in this encounter. ? ? ? ?No chief complaint on file. ?  ? ?History of Present Illness:   ? ?Leslie Franklin is a 61 y.o. female.  Patient has past medical history of carotid atherosclerosis, history of smoking in the past, complete heart block and permanent pacemaker.  She denies any problems at this time and takes care of activities of daily living.  No chest pain orthopnea or PND.  At the time of my evaluation, the patient is alert awake oriented and in no distress. ? ?Past Medical History:  ?Diagnosis Date  ? Aneurysm ([place]) 04/21/2021  ? Anxiety 09/13/2020  ? Brain aneurysm  04/21/2021  ? Cardiac murmur 09/18/2020  ? Carotid atherosclerosis 02/26/2021  ? Cerebral aneurysm 04/07/2021  ? Chest pain of uncertain etiology 123456  ? Claudication ([place]) 09/12/2020  ? COPD (chronic obstructive pulmonary disease) ([place])   ? Depression   ? Dyspnea   ? Ex-smoker 09/18/2020  ? Heart block AV complete ([place]) 11/20/2020  ? Hyperlipidemia   ? Palpitations 09/18/2020  ? Peripheral vascular disease ([place])   ? Presence of permanent cardiac pacemaker 11/20/2020  ? Screening mammogram for breast cancer 09/12/2020  ? Stroke [facility])   ? Syncope 09/12/2020  ? ? ?Past Surgical History:  ?Procedure Laterality Date  ? IR ANGIO INTRA EXTRACRAN SEL COM CAROTID INNOMINATE BILAT MOD SED  04/07/2021  ? IR ANGIO INTRA EXTRACRAN SEL INTERNAL CAROTID UNI R MOD SED  04/21/2021  ? IR ANGIO VERTEBRAL SEL SUBCLAVIAN INNOMINATE BILAT MOD SED  04/07/2021  ? IR ANGIOGRAM FOLLOW UP STUDY  04/21/2021  ? IR ANGIOGRAM FOLLOW UP STUDY  04/21/2021  ? IR CT HEAD LTD  04/21/2021  ? IR RADIOLOGIST EVAL & MGMT  03/12/2021  ? IR RADIOLOGIST EVAL & MGMT  05/07/2021  ? IR TRANSCATH/EMBOLIZ  04/21/2021  ? NO PAST SURGERIES    ? PACEMAKER IMPLANT N/A 10/21/2020  ? Procedure: PACEMAKER IMPLANT;  Surgeon: Evans Lance, MD;  Location: [place] [facility];  Service: Cardiovascular;  Laterality: N/A;  ? RADIOLOGY WITH ANESTHESIA N/A 04/07/2021  ? Procedure: EMBOLIZATION;  Surgeon: Luanne Bras,  MD;  Location: [place];  Service: Radiology;  Laterality: N/A;  ? RADIOLOGY WITH ANESTHESIA N/A 04/21/2021  ? Procedure: RADIOLOGY WITH ANESTHESIA EMBOLIZATION;  Surgeon: Luanne Bras, MD;  Location: [place];  Service: Radiology;  Laterality: N/A;  ? ? ?Current Medications: ?Current Meds  ?Medication Sig  ? aspirin 81 MG chewable tablet Chew 81 mg by mouth daily.  ? Budeson-Glycopyrrol-Formoterol (BREZTRI AEROSPHERE) 160-9-4.8 MCG/ACT AERO Inhale 2 puffs into the lungs 2 (two) times daily.  ? busPIRone (BUSPAR) 10 MG tablet TAKE 1 TABLET(10 MG) BY  MOUTH THREE TIMES DAILY  ? clopidogrel (PLAVIX) 75 MG tablet Take 0.5 tablets (37.5 mg total) by mouth daily. Take 0.5 (half) tablet by mouth on Tuesdays, Thursdays, and Saturdays only.  ? escitalopram (LEXAPRO) 10 MG tablet Take 1 tablet (10 mg total) by mouth daily.  ? hydrOXYzine (ATARAX) 25 MG tablet TAKE 1 TABLET(25 MG) BY MOUTH THREE TIMES DAILY AS NEEDED FOR ANXIETY  ? rosuvastatin (CRESTOR) 20 MG tablet TAKE 1 TABLET BY MOUTH ONCE DAILY  ? Vitamin D, Ergocalciferol, (DRISDOL) 1.25 MG (50000 UNIT) CAPS capsule Take 1 capsule (50,000 Units total) by mouth every 7 (seven) days.  ?  ? ?Allergies:   Patient has no known allergies.  ? ?Social History  ? ?Socioeconomic History  ? Marital status: Single  ?  Spouse name: Not on file  ? Number of children: 4  ? Years of education: Not on file  ? Highest education level: Not on file  ?Occupational History  ? Not on file  ?Tobacco Use  ? Smoking status: Former  ?  Packs/day: 1.00  ?  Years: 41.00  ?  Pack years: 41.00  ?  Types: Cigarettes  ?  Quit date: 08/31/2020  ?  Years since quitting: 1.3  ? Smokeless tobacco: Never  ?Vaping Use  ? Vaping Use: Never used  ?Substance and Sexual Activity  ? Alcohol use: Never  ? Drug use: Never  ? Sexual activity: Not on file  ?Other Topics Concern  ? Not on file  ?Social History Narrative  ? Not on file  ? ?Social Determinants of Health  ? ?Financial Resource Strain: Not on file  ?Food Insecurity: Not on file  ?Transportation Needs: Not on file  ?Physical Activity: Not on file  ?Stress: Not on file  ?Social Connections: Not on file  ?  ? ?Family History: ?The patient's family history includes Diabetes in her mother; Emphysema in her sister; Heart attack in her father; Hypertension in her mother; Stroke in her brother and father. ? ?ROS:   ?Please see the history of present illness.    ?All other systems reviewed and are negative. ? ?EKGs/Labs/Other Studies Reviewed:   ? ?The following studies were reviewed today: ?I discussed my  findings with the patient at length. ? ? ?Recent Labs: ?10/09/2021: ALT 10; BUN 14; Creatinine, Ser 0.91; Hemoglobin 14.3; Platelets 250; Potassium 4.7; Sodium 142; TSH 2.150  ?Recent Lipid Panel ?   ?Component Value Date/Time  ? CHOL 159 10/09/2021 0934  ? TRIG 103 10/09/2021 0934  ? HDL 53 10/09/2021 0934  ? CHOLHDL 3.0 10/09/2021 0934  ? [place] 87 10/09/2021 0934  ? ? ?Physical Exam:   ? ?VS:  BP 126/64   Pulse 96   Ht 5' 3.6" (1.615 m)   Wt 108 lb 6.4 oz (49.2 kg)   SpO2 97%   BMI 18.84 kg/m?    ? ?Wt Readings from Last 3 Encounters:  ?12/30/21 108 lb 6.4 oz (49.2  kg)  ?11/11/21 107 lb 12.8 oz (48.9 kg)  ?10/09/21 106 lb (48.1 kg)  ?  ? ?GEN: Patient is in no acute distress ?HEENT: Normal ?NECK: No JVD; No carotid bruits ?LYMPHATICS: No lymphadenopathy ?CARDIAC: Hear sounds regular, 2/6 systolic murmur at the apex. ?RESPIRATORY:  Clear to auscultation without rales, wheezing or rhonchi  ?ABDOMEN: Soft, non-tender, non-distended ?MUSCULOSKELETAL:  No edema; No deformity  ?SKIN: Warm and dry ?NEUROLOGIC:  Alert and oriented x 3 ?PSYCHIATRIC:  Normal affect  ? ?Signed, ?Jenean Lindau, MD  ?12/30/2021 8:49 AM    ?[place]  ?

## 2021-12-30 NOTE — Patient Instructions (Signed)

## 2022-01-07 NOTE — Progress Notes (Signed)
? ?Subjective:  ?Patient ID: Leslie Franklin, female    DOB: 03-28-1961  Age: 61 y.o. MRN: [id number] ? ?Chief Complaint  ?Patient presents with  ? Hyperlipidemia  ? Anxiety  ? Depression  ? ? ?HPI ? Leslie Franklin is a 61 year old Caucasian female that presents for follow-up of hyperlipidemia, anxiety with depression, and Vit D deficiency. She has an implanted pacemaker for 3rd degree heart block . She his followed by Dr Geraldo Pitter, cardiologist. Denies cp, dyspnea, or near syncope. History of a brain aneurysm. She is followed by Dr Tomi Likens, neurologist. Underwent embolization with Dr Estanislado Pandy 03/2021. Denies headaches, nausea, or memory loss.  ? ?Lipid/Cholesterol, Follow-up ? ?Last lipid panel Other pertinent labs  ?Lab Results  ?Component Value Date  ? CHOL 159 10/09/2021  ? HDL 53 10/09/2021  ? [place] 87 10/09/2021  ? TRIG 103 10/09/2021  ? CHOLHDL 3.0 10/09/2021  ? Lab Results  ?Component Value Date  ? ALT 10 10/09/2021  ? AST 17 10/09/2021  ? PLT 250 10/09/2021  ? TSH 2.150 10/09/2021  ?  ? ?She was last seen for this 3 months ago.  ?Management includes Crestor 20 mg. ? ?She reports excellent compliance with treatment. ?She is not having side effects.  ? ?Current diet: in general, a "healthy" diet   ?Current exercise: none ?Anxiety, Follow-up ? ?She was last seen for anxiety 3 months ago. ?Current treatment includes Buspar 10 mg TID and Atarax 25 mg PRN, pt states she is taking Buspar 10 mg one tab QD and Atarax PRN one tablet;. ?She reports excellent compliance with treatment. ?She reports good tolerance of treatment. ?She is not having side effects.  ?    She feels her anxiety is mild and Unchanged since last visit. ? ? ?GAD-7 Results ? ?  01/08/2022  ?  8:20 AM 10/09/2021  ?  9:07 AM 07/08/2021  ?  9:41 AM  ?GAD-7 Generalized Anxiety Disorder Screening Tool  ?1. Feeling Nervous, Anxious, or on Edge 0 1 1  ?2. Not Being Able to Stop or Control Worrying 1 1 0  ?3. Worrying Too Much About Different Things 3 1 1   ?4. Trouble  Relaxing 1 1 3   ?5. Being So Restless it's Hard To Sit Still 1 0 0  ?6. Becoming Easily Annoyed or Irritable 1 1 3   ?7. Feeling Afraid As If Something Awful Might Happen 0 0 0  ?Total GAD-7 Score 7 5 8   ?Difficulty At Work, Home, or Getting  Along With Others? Not difficult at all Not difficult at all Somewhat difficult  ? ? ?Depression, Follow-up ? ?She  was last seen for this 3 months ago. ?Current treatment includes Lexapro 10 mg daily. ?She reports excellent compliance with treatment. ?She is not having side effects.  ?She reports excellent tolerance of treatment. ?Current symptoms include: fatigue ?She feels she is Unchanged since last visit. ? ? ?  01/08/2022  ?  8:10 AM 10/09/2021  ?  9:06 AM 07/08/2021  ?  9:40 AM  ?Depression screen PHQ 2/9  ?Decreased Interest 0 0 1  ?Down, Depressed, Hopeless 0 0 0  ?PHQ - 2 Score 0 0 1  ?Altered sleeping 0 0 0  ?Tired, decreased energy 2 0 3  ?Change in appetite 2 3 3   ?Feeling bad or failure about yourself  0 0 0  ?Trouble concentrating 2 0 0  ?Moving slowly or fidgety/restless 0 0 0  ?Suicidal thoughts 0 0 0  ?PHQ-9 Score 6 3 7   ?Difficult doing work/chores  Somewhat difficult Not difficult at all Not difficult at all  ?   ? Vitamin D deficiency, follow-up ? ?Lab Results  ?Component Value Date  ? VD25OH 26.6 (L) 10/09/2021  ? CALCIUM 10.0 10/09/2021  ? CALCIUM 9.0 07/26/2021  ? ?Wt Readings from Last 3 Encounters:  ?01/08/22 106 lb (48.1 kg)  ?12/30/21 108 lb 6.4 oz (49.2 kg)  ?11/11/21 107 lb 12.8 oz (48.9 kg)  ? ? ?She was last seen for vitamin D deficiency 3 months ago.  ?Management since that visit includes Vit D 50, 000 U weekly and Vit D rich diet. ?She reports excellent compliance with treatment. ?She is not having side effects.  ? ? ?--------------------------------------------------------------------------------------------------- ? ? ? ? ?  ? ?Current Outpatient Medications on File Prior to Visit  ?Medication Sig Dispense Refill  ? aspirin 81 MG chewable tablet  Chew 81 mg by mouth daily.    ? Budeson-Glycopyrrol-Formoterol (BREZTRI AEROSPHERE) 160-9-4.8 MCG/ACT AERO Inhale 2 puffs into the lungs 2 (two) times daily. 10.7 g 0  ? busPIRone (BUSPAR) 10 MG tablet TAKE 1 TABLET(10 MG) BY MOUTH THREE TIMES DAILY 90 tablet 1  ? clopidogrel (PLAVIX) 75 MG tablet Take 0.5 tablets (37.5 mg total) by mouth daily. Take 0.5 (half) tablet by mouth on Tuesdays, Thursdays, and Saturdays only. 30 tablet 1  ? escitalopram (LEXAPRO) 10 MG tablet Take 1 tablet (10 mg total) by mouth daily. 90 tablet 1  ? hydrOXYzine (ATARAX) 25 MG tablet TAKE 1 TABLET(25 MG) BY MOUTH THREE TIMES DAILY AS NEEDED FOR ANXIETY 90 tablet 1  ? rosuvastatin (CRESTOR) 20 MG tablet TAKE 1 TABLET BY MOUTH ONCE DAILY 90 tablet 1  ? Vitamin D, Ergocalciferol, (DRISDOL) 1.25 MG (50000 UNIT) CAPS capsule Take 1 capsule (50,000 Units total) by mouth every 7 (seven) days. 5 capsule 2  ? ?No current facility-administered medications on file prior to visit.  ? ?Past Medical History:  ?Diagnosis Date  ? Aneurysm ([place]) 04/21/2021  ? Anxiety 09/13/2020  ? Brain aneurysm 04/21/2021  ? Cardiac murmur 09/18/2020  ? Carotid atherosclerosis 02/26/2021  ? Cerebral aneurysm 04/07/2021  ? Chest pain of uncertain etiology 123456  ? Claudication ([place]) 09/12/2020  ? COPD (chronic obstructive pulmonary disease) ([place])   ? Depression   ? Dyspnea   ? Ex-smoker 09/18/2020  ? Heart block AV complete ([place]) 11/20/2020  ? Hyperlipidemia   ? Palpitations 09/18/2020  ? Peripheral vascular disease ([place])   ? Presence of permanent cardiac pacemaker 11/20/2020  ? Screening mammogram for breast cancer 09/12/2020  ? Stroke [facility])   ? Syncope 09/12/2020  ? ?Past Surgical History:  ?Procedure Laterality Date  ? IR ANGIO INTRA EXTRACRAN SEL COM CAROTID INNOMINATE BILAT MOD SED  04/07/2021  ? IR ANGIO INTRA EXTRACRAN SEL INTERNAL CAROTID UNI R MOD SED  04/21/2021  ? IR ANGIO VERTEBRAL SEL SUBCLAVIAN INNOMINATE BILAT MOD SED  04/07/2021  ? IR ANGIOGRAM FOLLOW UP  STUDY  04/21/2021  ? IR ANGIOGRAM FOLLOW UP STUDY  04/21/2021  ? IR CT HEAD LTD  04/21/2021  ? IR RADIOLOGIST EVAL & MGMT  03/12/2021  ? IR RADIOLOGIST EVAL & MGMT  05/07/2021  ? IR TRANSCATH/EMBOLIZ  04/21/2021  ? NO PAST SURGERIES    ? PACEMAKER IMPLANT N/A 10/21/2020  ? Procedure: PACEMAKER IMPLANT;  Surgeon: Evans Lance, MD;  Location: [place] [facility];  Service: Cardiovascular;  Laterality: N/A;  ? RADIOLOGY WITH ANESTHESIA N/A 04/07/2021  ? Procedure: EMBOLIZATION;  Surgeon: Luanne Bras, MD;  Location: [facility]  OR;  Service: Radiology;  Laterality: N/A;  ? RADIOLOGY WITH ANESTHESIA N/A 04/21/2021  ? Procedure: RADIOLOGY WITH ANESTHESIA EMBOLIZATION;  Surgeon: Luanne Bras, MD;  Location: [place];  Service: Radiology;  Laterality: N/A;  ?  ?Family History  ?Problem Relation Age of Onset  ? Hypertension Mother   ? Diabetes Mother   ? Heart attack Father   ? Stroke Father   ? Emphysema Sister   ? Stroke Brother   ? ?Social History  ? ?Socioeconomic History  ? Marital status: Single  ?  Spouse name: Not on file  ? Number of children: 4  ? Years of education: Not on file  ? Highest education level: Not on file  ?Occupational History  ? Not on file  ?Tobacco Use  ? Smoking status: Former  ?  Packs/day: 1.00  ?  Years: 41.00  ?  Pack years: 41.00  ?  Types: Cigarettes  ?  Quit date: 08/31/2020  ?  Years since quitting: 1.3  ? Smokeless tobacco: Never  ?Vaping Use  ? Vaping Use: Never used  ?Substance and Sexual Activity  ? Alcohol use: Never  ? Drug use: Never  ? Sexual activity: Not on file  ?Other Topics Concern  ? Not on file  ?Social History Narrative  ? Not on file  ? ?Social Determinants of Health  ? ?Financial Resource Strain: Not on file  ?Food Insecurity: Not on file  ?Transportation Needs: Not on file  ?Physical Activity: Not on file  ?Stress: Not on file  ?Social Connections: Not on file  ? ? ?Review of Systems  ?Constitutional:  Positive for fatigue. Negative for chills and fever.  ?HENT:   Negative for congestion, ear pain, rhinorrhea and sore throat.   ?Respiratory:  Negative for cough and shortness of breath.   ?Cardiovascular:  Negative for chest pain.  ?Gastrointestinal:  Negative for abdominal p

## 2022-01-08 ENCOUNTER — Ambulatory Visit: Payer: Medicaid Other | Admitting: Nurse Practitioner

## 2022-01-08 ENCOUNTER — Encounter: Payer: Self-pay | Admitting: Nurse Practitioner

## 2022-01-08 VITALS — BP 110/70 | HR 83 | Temp 98.7°F | Resp 15 | Ht 63.6 in | Wt 106.0 lb

## 2022-01-08 DIAGNOSIS — Z1231 Encounter for screening mammogram for malignant neoplasm of breast: Secondary | ICD-10-CM

## 2022-01-08 DIAGNOSIS — Z8679 Personal history of other diseases of the circulatory system: Secondary | ICD-10-CM

## 2022-01-08 DIAGNOSIS — F321 Major depressive disorder, single episode, moderate: Secondary | ICD-10-CM | POA: Diagnosis not present

## 2022-01-08 DIAGNOSIS — E782 Mixed hyperlipidemia: Secondary | ICD-10-CM | POA: Diagnosis not present

## 2022-01-08 DIAGNOSIS — Z950 Presence of cardiac pacemaker: Secondary | ICD-10-CM

## 2022-01-08 DIAGNOSIS — F411 Generalized anxiety disorder: Secondary | ICD-10-CM

## 2022-01-08 DIAGNOSIS — E559 Vitamin D deficiency, unspecified: Secondary | ICD-10-CM | POA: Diagnosis not present

## 2022-01-08 DIAGNOSIS — Z681 Body mass index (BMI) 19 or less, adult: Secondary | ICD-10-CM | POA: Diagnosis not present

## 2022-01-08 DIAGNOSIS — J449 Chronic obstructive pulmonary disease, unspecified: Secondary | ICD-10-CM

## 2022-01-08 NOTE — Patient Instructions (Addendum)
Continue medications ?We will call you with lab results and mammogram appointment for Wednesday, May 24th, 2023 ?Follow-up 5-months, fasting ? ? ?Serotonin Syndrome ?Serotonin is a chemical in your body (neurotransmitter) that helps to control several functions, such as: ?Brain and nerve cell function. ?Mood and emotions. ?Memory. ?Eating. ?Sleeping. ?Sexual activity. ?Stress response. ?Having too much serotonin in your body can cause serotonin syndrome. This condition can be harmful to your brain and nerve cells. This can be a life-threatening condition. ?What are the causes? ?This condition may be caused by taking medicines or drugs that increase the level of serotonin in your body, such as: ?Antidepressant medicines. ?Migraine medicines. ?Certain pain medicines. ?Certain drugs, including ecstasy, LSD, cocaine, and amphetamines. ?Over-the-counter cough or cold medicines that contain dextromethorphan. ?Certain herbal supplements, including St. John's wort, ginseng, and nutmeg. ?This condition usually occurs when you take these medicines or drugs in combination, but it can also happen with a high dose of a single medicine or drug. ?What increases the risk? ?You are more likely to develop this condition if: ?You just started taking a medicine or drug that increases the level of serotonin in the body. ?You recently increased the dose of a medicine or drug that increases the level of serotonin in the body. ?You take more than one medicine or drug that increases the level of serotonin in the body. ?What are the signs or symptoms? ?Symptoms of this condition usually start within several hours of taking a medicine or drug. Symptoms may be mild or severe. Mild symptoms include: ?Sweating. ?Restlessness or agitation. ?Muscle twitching or stiffness. ?Rapid heart rate. ?Nausea and vomiting. ?Diarrhea. ?Headache. ?Shivering or goose bumps. ?Confusion. ?Severe symptoms include: ?Irregular heartbeat. ?Seizures. ?Loss of  consciousness. ?High fever. ?How is this diagnosed? ?This condition may be diagnosed based on: ?Your medical history.  ?A physical exam. ?Your prior use of drugs and medicines. ?Blood or urine tests. These may be used to rule out other causes of your symptoms. ?How is this treated? ?The treatment for this condition depends on the severity of your symptoms. ?For mild cases, stopping the medicine or drug that caused your condition is usually all that is needed. ?For moderate to severe cases, treatment in a hospital may be needed to prevent or manage life-threatening symptoms. This may include medicines to control your symptoms, IV fluids, interventions to support your breathing, and treatments to control your body temperature. ?Follow these instructions at home: ?Medicines ? ?Take over-the-counter and prescription medicines only as told by your health care provider. This is important. ?Check with your health care provider before you start taking any new prescriptions, over-the-counter medicines, herbs, or supplements. ?Avoid combining any medicines that can cause this condition to occur. ?Lifestyle ? ?Maintain a healthy lifestyle. ?Eat a healthy diet that includes plenty of vegetables, fruits, whole grains, low-fat dairy products, and lean protein. Do not eat a lot of foods that are high in fat, added sugars, or salt. ?Get the right amount and quality of sleep. Most adults need 7-9 hours of sleep each night. ?Make time to exercise, even if it is only for short periods of time. Most adults should exercise for at least 150 minutes each week. ?Do not drink alcohol. ?Do not use illegal drugs, and do not take medicines for reasons other than they are prescribed. ?General instructions ?Do not use any products that contain nicotine or tobacco, such as cigarettes and e-cigarettes. If you need help quitting, ask your health care provider. ?Keep all follow-up visits as  told by your health care provider. This is  important. ?Contact a health care provider if: ?Your symptoms do not improve or they get worse. ?Get help right away if you: ?Have worsening confusion, severe headache, chest pain, high fever, seizures, or loss of consciousness. ?Experience serious side effects of medicine, such as swelling of your face, lips, tongue, or throat. ?Have serious thoughts about hurting yourself or others. ?These symptoms may represent a serious problem that is an emergency. Do not wait to see if the symptoms will go away. Get medical help right away. Call your local emergency services (911 in the [place] Do not drive yourself to the hospital. ?If you ever feel like you may hurt yourself or others, or have thoughts about taking your own life, get help right away. You can go to your nearest emergency department or call: ?Your local emergency services (911 in the [place] ??A suicide crisis helpline, such as the National Suicide Prevention Lifeline at 539-570-0791 or 988 in the [place] This is open 24 hours a day. ?Summary ?Serotonin is a brain chemical that helps to regulate the nervous system. High levels of serotonin in the body can cause serotonin syndrome, which is a very dangerous condition. ?This condition may be caused by taking medicines or drugs that increase the level of serotonin in your body. ?Treatment depends on the severity of your symptoms. For mild cases, stopping the medicine or drug that caused your condition is usually all that is needed. ?Check with your health care provider before you start taking any new prescriptions, over-the-counter medicines, herbs, or supplements. ?This information is not intended to replace advice given to you by your health care provider. Make sure you discuss any questions you have with your health care provider. ?Document Revised: 07/11/2021 Document Reviewed: 06/19/2021 ?Elsevier Patient Education ? 2023 Elsevier Inc. ? ?

## 2022-01-09 LAB — CBC WITH DIFFERENTIAL/PLATELET
Basophils Absolute: 0.1 10*3/uL (ref 0.0–0.2)
Basos: 1 %
EOS (ABSOLUTE): 0.3 10*3/uL (ref 0.0–0.4)
Eos: 3 %
Hematocrit: 43.9 % (ref 34.0–46.6)
Hemoglobin: 14.2 g/dL (ref 11.1–15.9)
Immature Grans (Abs): 0 10*3/uL (ref 0.0–0.1)
Immature Granulocytes: 0 %
Lymphocytes Absolute: 3.1 10*3/uL (ref 0.7–3.1)
Lymphs: 33 %
MCH: 29.2 pg (ref 26.6–33.0)
MCHC: 32.3 g/dL (ref 31.5–35.7)
MCV: 90 fL (ref 79–97)
Monocytes Absolute: 0.6 10*3/uL (ref 0.1–0.9)
Monocytes: 6 %
Neutrophils Absolute: 5.3 10*3/uL (ref 1.4–7.0)
Neutrophils: 57 %
Platelets: 255 10*3/uL (ref 150–450)
RBC: 4.87 x10E6/uL (ref 3.77–5.28)
RDW: 13 % (ref 11.7–15.4)
WBC: 9.2 10*3/uL (ref 3.4–10.8)

## 2022-01-09 LAB — COMPREHENSIVE METABOLIC PANEL
ALT: 12 IU/L (ref 0–32)
AST: 21 IU/L (ref 0–40)
Albumin/Globulin Ratio: 2 (ref 1.2–2.2)
Albumin: 4.4 g/dL (ref 3.8–4.9)
Alkaline Phosphatase: 124 IU/L — ABNORMAL HIGH (ref 44–121)
BUN/Creatinine Ratio: 12 (ref 9–23)
BUN: 11 mg/dL (ref 6–24)
Bilirubin Total: 0.3 mg/dL (ref 0.0–1.2)
CO2: 23 mmol/L (ref 20–29)
Calcium: 9.3 mg/dL (ref 8.7–10.2)
Chloride: 106 mmol/L (ref 96–106)
Creatinine, Ser: 0.92 mg/dL (ref 0.57–1.00)
Globulin, Total: 2.2 g/dL (ref 1.5–4.5)
Glucose: 87 mg/dL (ref 70–99)
Potassium: 4.1 mmol/L (ref 3.5–5.2)
Sodium: 143 mmol/L (ref 134–144)
Total Protein: 6.6 g/dL (ref 6.0–8.5)
eGFR: 72 mL/min/{1.73_m2} (ref >59)

## 2022-01-09 LAB — LIPID PANEL
Chol/HDL Ratio: 2.3 ratio (ref 0.0–4.4)
Cholesterol, Total: 136 mg/dL (ref 100–199)
HDL: 58 mg/dL (ref >39)
LDL Chol Calc (NIH): 65 mg/dL (ref 0–99)
Triglycerides: 59 mg/dL (ref 0–149)
VLDL Cholesterol Cal: 13 mg/dL (ref 5–40)

## 2022-01-09 LAB — CARDIOVASCULAR RISK ASSESSMENT

## 2022-01-09 LAB — VITAMIN D 25 HYDROXY (VIT D DEFICIENCY, FRACTURES): Vit D, 25-Hydroxy: 32.1 ng/mL (ref 30.0–100.0)

## 2022-01-21 ENCOUNTER — Other Ambulatory Visit: Payer: Self-pay | Admitting: Nurse Practitioner

## 2022-01-21 DIAGNOSIS — Z1231 Encounter for screening mammogram for malignant neoplasm of breast: Secondary | ICD-10-CM

## 2022-02-06 ENCOUNTER — Other Ambulatory Visit: Payer: Self-pay | Admitting: Nurse Practitioner

## 2022-02-06 DIAGNOSIS — F411 Generalized anxiety disorder: Secondary | ICD-10-CM

## 2022-02-25 ENCOUNTER — Ambulatory Visit
Admission: RE | Admit: 2022-02-25 | Discharge: 2022-02-25 | Disposition: A | Discharge status: Home / Self Care | Payer: Medicaid Other | Source: Ambulatory Visit | Attending: Nurse Practitioner | Admitting: Nurse Practitioner

## 2022-02-25 DIAGNOSIS — Z1231 Encounter for screening mammogram for malignant neoplasm of breast: Secondary | ICD-10-CM | POA: Diagnosis not present

## 2022-02-25 DIAGNOSIS — H524 Presbyopia: Secondary | ICD-10-CM | POA: Diagnosis not present

## 2022-02-25 DIAGNOSIS — H5213 Myopia, bilateral: Secondary | ICD-10-CM | POA: Diagnosis not present

## 2022-04-15 ENCOUNTER — Other Ambulatory Visit: Payer: Self-pay | Admitting: Nurse Practitioner

## 2022-04-15 DIAGNOSIS — F411 Generalized anxiety disorder: Secondary | ICD-10-CM

## 2022-04-15 DIAGNOSIS — F321 Major depressive disorder, single episode, moderate: Secondary | ICD-10-CM

## 2022-04-20 ENCOUNTER — Ambulatory Visit (INDEPENDENT_AMBULATORY_CARE_PROVIDER_SITE_OTHER): Payer: Medicaid Other

## 2022-04-20 DIAGNOSIS — I442 Atrioventricular block, complete: Secondary | ICD-10-CM | POA: Diagnosis not present

## 2022-04-21 LAB — CUP PACEART REMOTE DEVICE CHECK
Date Time Interrogation Session: 20230822073003
Implantable Lead Implant Date: 20220221
Implantable Lead Implant Date: 20220221
Implantable Lead Location: 753859
Implantable Lead Location: 753860
Implantable Lead Model: 377171
Implantable Lead Model: 377171
Implantable Lead Serial Number: 8000187789
Implantable Lead Serial Number: 8000197459
Implantable Pulse Generator Implant Date: 20220221
Pulse Gen Model: 407145
Pulse Gen Serial Number: 70053960

## 2022-05-11 ENCOUNTER — Ambulatory Visit: Payer: Medicaid Other | Attending: Cardiology | Admitting: Cardiology

## 2022-05-11 ENCOUNTER — Encounter: Payer: Self-pay | Admitting: Cardiology

## 2022-05-11 VITALS — BP 132/84 | HR 80 | Ht 63.0 in | Wt 102.8 lb

## 2022-05-11 DIAGNOSIS — I442 Atrioventricular block, complete: Secondary | ICD-10-CM

## 2022-05-11 NOTE — Progress Notes (Signed)
Electrophysiology Office Note   Date:  05/11/2022   ID:  Leslie Franklin, DOB [birth year], MRN [id number]  PCP:  [name], NP  Cardiologist:  Revankar Primary Electrophysiologist:  [name] [name], MD    Chief Complaint: pacemaker   History of Present Illness: Leslie Franklin is a 61 y.o. female who is being seen today for the evaluation of pacemaker at the request of [name], NP. Presenting today for electrophysiology evaluation.  She has a history significant for stroke, complete heart block, syncope.  She is now status post Biotronik dual-chamber pacemaker implanted 10/21/2020.  Today, she denies symptoms of palpitations, chest pain, shortness of breath, orthopnea, PND, lower extremity edema, claudication, dizziness, presyncope, syncope, bleeding, or neurologic sequela. The patient is tolerating medications without difficulties.    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   Screening mammogram for breast cancer 09/12/2020   Stroke (HCC)    Syncope 09/12/2020   Past Surgical History:  Procedure Laterality Date   CATARACT EXTRACTION BILATERAL W/ ANTERIOR VITRECTOMY Bilateral 11/2021   IR ANGIO INTRA EXTRACRAN SEL COM CAROTID INNOMINATE BILAT MOD SED  04/07/2021   IR ANGIO INTRA EXTRACRAN SEL INTERNAL CAROTID UNI R MOD SED  04/21/2021   IR ANGIO VERTEBRAL SEL SUBCLAVIAN INNOMINATE BILAT MOD SED  04/07/2021   IR ANGIOGRAM FOLLOW UP STUDY   04/21/2021   IR ANGIOGRAM FOLLOW UP STUDY  04/21/2021   IR CT HEAD LTD  04/21/2021   IR RADIOLOGIST EVAL & MGMT  03/12/2021   IR RADIOLOGIST EVAL & MGMT  05/07/2021   IR TRANSCATH/EMBOLIZ  04/21/2021   NO PAST SURGERIES     PACEMAKER IMPLANT N/A 10/21/2020   Procedure: PACEMAKER IMPLANT;  Surgeon: Marinus Maw, MD;  Location: [facility];  Service: Cardiovascular;  Laterality: N/A;   RADIOLOGY WITH ANESTHESIA N/A 04/07/2021   Procedure: EMBOLIZATION;  Surgeon: Julieanne Cotton, MD;  Location: [facility] OR;  Service: Radiology;  Laterality: N/A;   RADIOLOGY WITH ANESTHESIA N/A 04/21/2021   Procedure: RADIOLOGY WITH ANESTHESIA EMBOLIZATION;  Surgeon: Julieanne Cotton, MD;  Location: [facility] OR;  Service: Radiology;  Laterality: N/A;     Current Outpatient Medications  Medication Sig Dispense Refill   aspirin 81 MG chewable tablet Chew 81 mg by mouth daily.     Budeson-Glycopyrrol-Formoterol (BREZTRI AEROSPHERE) 160-9-4.8 MCG/ACT AERO Inhale 2 puffs into the lungs 2 (two) times daily. 10.7 g 0   clopidogrel (PLAVIX) 75 MG tablet Take 0.5 tablets (37.5 mg total) by mouth daily. Take 0.5 (half) tablet by mouth on Tuesdays, Thursdays, and Saturdays only. 30 tablet 1   escitalopram (LEXAPRO) 10 MG tablet TAKE 1 TABLET(10 MG) BY MOUTH DAILY 90 tablet 1   rosuvastatin (CRESTOR) 20 MG tablet TAKE 1 TABLET BY MOUTH ONCE DAILY 90 tablet 1   No current facility-administered medications for this visit.    Allergies:   Patient has no known allergies.   Social History:  The patient  reports  that she quit smoking about 20 months ago. Her smoking use included cigarettes. She has a 41.00 pack-year smoking history. She has never used smokeless tobacco. She reports that she does not drink alcohol and does not use drugs.   Family History:  The patient's family history includes Breast cancer in her daughter; Diabetes in her mother; Emphysema in her sister; Heart attack in her father; Hypertension in her  mother; Stroke in her brother and father.    ROS:  Please see the history of present illness.   Otherwise, review of systems is positive for none.   All other systems are reviewed and negative.    PHYSICAL EXAM: VS:  BP 132/84   Pulse 80   Ht 5\' 3"  (1.6 m)   Wt 102 lb 12.8 oz (46.6 kg)   SpO2 98%   BMI 18.21 kg/m  , BMI Body mass index is 18.21 kg/m. GEN: Well nourished, well developed, in no acute distress  HEENT: normal  Neck: no JVD, carotid bruits, or masses Cardiac: RRR; no murmurs, rubs, or gallops,no edema  Respiratory:  clear to auscultation bilaterally, normal work of breathing GI: soft, nontender, nondistended, + BS MS: no deformity or atrophy  Skin: warm and dry, device pocket is well healed Neuro:  Strength and sensation are intact Psych: euthymic mood, full affect  EKG:  EKG is ordered today. Personal review of the ekg ordered shows A paced, rate 80  Device interrogation is reviewed today in detail.  See PaceArt for details.   Recent Labs: 10/09/2021: TSH 2.150 01/08/2022: ALT 12; BUN 11; Creatinine, Ser 0.92; Hemoglobin 14.2; Platelets 255; Potassium 4.1; Sodium 143    Lipid Panel     Component Value Date/Time   CHOL 136 01/08/2022 0922   TRIG 59 01/08/2022 0922   HDL 58 01/08/2022 0922   CHOLHDL 2.3 01/08/2022 0922   LDLCALC 65 01/08/2022 0922     Wt Readings from Last 3 Encounters:  05/11/22 102 lb 12.8 oz (46.6 kg)  01/08/22 106 lb (48.1 kg)  12/30/21 108 lb 6.4 oz (49.2 kg)      Other studies Reviewed: Additional studies/ records that were reviewed today include: TTE 10/14/20  Review of the above records today demonstrates:   1. Left ventricular ejection fraction, by estimation, is 60 to 65%. The  left ventricle has normal function. The left ventricle has no regional  wall motion abnormalities. Left ventricular diastolic parameters were  normal.   2. Right ventricular systolic function is normal. The right ventricular  size is normal. There  is normal pulmonary artery systolic pressure.   3. The mitral valve is normal in structure. No evidence of mitral valve  regurgitation. No evidence of mitral stenosis.   4. The aortic valve is tricuspid. Aortic valve regurgitation is not  visualized. No aortic stenosis is present.   5. The inferior vena cava is normal in size with greater than 50%  respiratory variability, suggesting right atrial pressure of 3 mmHg.    ASSESSMENT AND PLAN:  1.  Complete heart block: With the cause of her episode of syncope.  She is now status post Biotronik dual-chamber pacemaker implanted 10/18/2020.  Device functioning appropriately.  No changes at this time.   Current medicines are reviewed at length with the patient today.   The patient does not have concerns regarding her medicines.  The following changes were made today:  none  Labs/ tests ordered today include:  Orders Placed This Encounter  Procedures   EKG 12-Lead  Disposition:   FU with Hadlei Stitt 1 year  Signed, Zehra Rucci [name], MD  05/11/2022 12:18 PM     [facility] [address] [place] [address] [phone] (office) [phone] (fax)

## 2022-05-11 NOTE — Patient Instructions (Signed)
Medication Instructions:  Your physician recommends that you continue on your current medications as directed. Please refer to the Current Medication list given to you today.  *If you need a refill on your cardiac medications before your next appointment, please call your pharmacy*   Lab Work: None ordered   Testing/Procedures: None ordered   Follow-Up: At [facility], you and your health needs are our priority.  As part of our continuing mission to provide you with exceptional heart care, we have created designated Provider Care Teams.  These Care Teams include your primary Cardiologist (physician) and Advanced Practice Providers (APPs -  Physician Assistants and Nurse Practitioners) who all work together to provide you with the care you need, when you need it.  Your next appointment:   1 year(s)  The format for your next appointment:   In Person  Provider:   Will Camnitz, MD    Thank you for choosing [facility]!!   Shiya Fogelman, RN [phone]  Other Instructions   Important Information About Sugar           

## 2022-05-12 NOTE — Progress Notes (Signed)
NEUROLOGY FOLLOW UP OFFICE NOTE  Tanea Moga [id number]  Assessment/Plan:   1.  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  Headache - onset with discovery of aneurysm - migrainous features - stable 3.  Cerebrovascular disease    1.  Advised to contact Dr. Fatima Sanger office to follow up.  I sent a staff message to him myself.   2.  Continue current medications for secondary stroke prevention as managed by PCP:  Plavix, Crestor 3. Follow up 6 months.     Subjective:  Koda Defrank is a 61 year old right-handed female with COPD, HLD, depression and history of complete heart block s/p PPM and essential tremor who follows up for cerebral aneurysm.  UPDATE:  Left sided supraorbital/ear pressure headache still occurs but improved.  May occur off and on for up to 4 days.  It occurs infrequently.  Last one occurred 2 weeks ago.  She doesn't take anything for it.  Tylenol ineffective.  Will just lay down.  Associated dizziness, nausea, photophobia and phonophobia.  She hasn't followed up with Dr. Corliss Skains.  At her last arteriogram in August 2022, it was advised to repeat imaging in 4-6 months.     HISTORY: She began experiencing recurrent dizziness and syncope for maybe 10 years but started becoming worse since 2018.  The dizziness feels like "getting up too fast".  It occurs when standing, moving or sitting.  It lasts a couple of minutes.  Usually will lay down.  It is associated with tunnel vision, palpitations, diaphoresis and feeling hot.  No nausea.  She sometimes feels like she is going to pass out and has actually passed out at least three times.  No postictal confusion, tongue/cheek laceration or incontinence.  She has been evaluated by cardiology.  Echocardiogram on 10/14/2020 was unremarkable with EF 60-65% with no valvular abnormalities or PFO/IAS.  Cardiac monitor has recorded episodes of daytime complete heart block.  She had a pacemaker implanted on  10/21/2020.  To further evaluate symptoms, she had a CT head on 11/12/2020 which demonstrated age indeterminate small lacunar infarcts in the right frontal cortical regiion and right cerebellar hemisphere.  She was started on ASA 81mg  daily.  Carotid ultrasound on 01/06/2021 showed less than 50% stenosis in the bilateral carotid arteries and antegrade flow in the vertebral arteries bilaterally.  She was started on Crestor.  LDL from 12/25/2020 was 110 (down from 218 in January).  TSH from 09/13/2020 was 1.220.  Since the pacemaker, she has less dizzy spells but they still occurred daily.  Underwent imaging on 03/06/2021.  MRI of brain without contrast showed mild chronic small vessel ischemic changes and redemonstrated small chronic cortical/subcortical infarct within the right middle frontal gyrus and three chronic lacunar nfarcts within the right cerebellar hemisphere but no acute abnormalities.  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  She was referred to interventional radiology and underwent endovascular treatment using pipeline flow diverter device  Dizziness has improved after the surgery.  Since the surgery, head pressure became less frequent.  PAST MEDICAL HISTORY: 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   Cardiac murmur 09/18/2020   Carotid atherosclerosis 02/26/2021   Cerebral aneurysm 04/07/2021   Chest pain of uncertain etiology  09/18/2020   Claudication (HCC) 09/12/2020   COPD (chronic obstructive pulmonary disease) (HCC)    Depression    Dyspnea    Ex-smoker 09/18/2020   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   Hyperlipidemia    Palpitations 09/18/2020   Peripheral vascular disease (HCC)    Presence of permanent cardiac pacemaker  11/20/2020   Screening mammogram for breast cancer 09/12/2020   Stroke [facility])    Syncope 09/12/2020    MEDICATIONS: Current Outpatient Medications on File Prior to Visit  Medication Sig Dispense Refill   aspirin 81 MG chewable tablet Chew 81 mg by mouth daily.     Budeson-Glycopyrrol-Formoterol (BREZTRI AEROSPHERE) 160-9-4.8 MCG/ACT AERO Inhale 2 puffs into the lungs 2 (two) times daily. 10.7 g 0   clopidogrel (PLAVIX) 75 MG tablet Take 0.5 tablets (37.5 mg total) by mouth daily. Take 0.5 (half) tablet by mouth on Tuesdays, Thursdays, and Saturdays only. 30 tablet 1   escitalopram (LEXAPRO) 10 MG tablet TAKE 1 TABLET(10 MG) BY MOUTH DAILY 90 tablet 1   rosuvastatin (CRESTOR) 20 MG tablet TAKE 1 TABLET BY MOUTH ONCE DAILY 90 tablet 1   No current facility-administered medications on file prior to visit.    ALLERGIES: No Known Allergies  FAMILY HISTORY: Family History  Problem Relation Age of Onset   Hypertension Mother    Diabetes Mother    Heart attack Father    Stroke Father    Emphysema Sister    Breast cancer Daughter    Stroke Brother       Objective:  Blood pressure 110/65, pulse 72, height 5\' 3"  (1.6 m), weight 103 lb 9.6 oz (47 kg), SpO2 98 %.. General: No acute distress.  Patient appears well-groomed.   Head:  Normocephalic/atraumatic Eyes:  Fundi examined but not visualized Neurological Exam: alert and oriented to person, place, and time.  Speech fluent and not dysarthric, language intact.  CN II-XII intact. Bulk and tone normal, muscle strength 5/5 throughout.  Mild postural and kinetic tremor in hands.  Sensation to light touch intact.  Deep tendon reflexes 2+ throughout, toes downgoing.  Finger to nose testing with kinetic and intention tremor  Gait normal, Romberg negative.   , DO  CC: Shon Millet, NP

## 2022-05-14 ENCOUNTER — Encounter: Payer: Self-pay | Admitting: Neurology

## 2022-05-14 ENCOUNTER — Ambulatory Visit: Payer: Medicaid Other | Admitting: Neurology

## 2022-05-14 VITALS — BP 110/65 | HR 72 | Ht 63.0 in | Wt 103.6 lb

## 2022-05-14 DIAGNOSIS — I679 Cerebrovascular disease, unspecified: Secondary | ICD-10-CM

## 2022-05-14 DIAGNOSIS — G43009 Migraine without aura, not intractable, without status migrainosus: Secondary | ICD-10-CM

## 2022-05-14 DIAGNOSIS — I671 Cerebral aneurysm, nonruptured: Secondary | ICD-10-CM | POA: Diagnosis not present

## 2022-05-16 NOTE — Progress Notes (Signed)
Remote pacemaker transmission.   

## 2022-05-26 ENCOUNTER — Other Ambulatory Visit (HOSPITAL_COMMUNITY): Payer: Self-pay | Admitting: Interventional Radiology

## 2022-05-26 ENCOUNTER — Telehealth (HOSPITAL_COMMUNITY): Payer: Self-pay

## 2022-05-26 DIAGNOSIS — I671 Cerebral aneurysm, nonruptured: Secondary | ICD-10-CM

## 2022-05-26 NOTE — Telephone Encounter (Signed)
Called to schedule diagnostic angiogram, no answer, left vm. AW  

## 2022-06-03 ENCOUNTER — Other Ambulatory Visit: Payer: Self-pay | Admitting: Radiology

## 2022-06-03 DIAGNOSIS — I671 Cerebral aneurysm, nonruptured: Secondary | ICD-10-CM

## 2022-06-04 ENCOUNTER — Other Ambulatory Visit (HOSPITAL_COMMUNITY): Payer: Self-pay | Admitting: Interventional Radiology

## 2022-06-04 ENCOUNTER — Ambulatory Visit (HOSPITAL_COMMUNITY)
Admission: RE | Admit: 2022-06-04 | Discharge: 2022-06-04 | Disposition: A | Discharge status: Home / Self Care | Payer: Medicaid Other | Source: Ambulatory Visit | Attending: Interventional Radiology | Admitting: Interventional Radiology

## 2022-06-04 ENCOUNTER — Other Ambulatory Visit: Payer: Self-pay

## 2022-06-04 DIAGNOSIS — Z7902 Long term (current) use of antithrombotics/antiplatelets: Secondary | ICD-10-CM | POA: Insufficient documentation

## 2022-06-04 DIAGNOSIS — I671 Cerebral aneurysm, nonruptured: Secondary | ICD-10-CM

## 2022-06-04 DIAGNOSIS — Z87891 Personal history of nicotine dependence: Secondary | ICD-10-CM | POA: Insufficient documentation

## 2022-06-04 DIAGNOSIS — I6521 Occlusion and stenosis of right carotid artery: Secondary | ICD-10-CM | POA: Insufficient documentation

## 2022-06-04 DIAGNOSIS — Z7982 Long term (current) use of aspirin: Secondary | ICD-10-CM | POA: Insufficient documentation

## 2022-06-04 DIAGNOSIS — Z8679 Personal history of other diseases of the circulatory system: Secondary | ICD-10-CM | POA: Diagnosis not present

## 2022-06-04 HISTORY — PX: IR ANGIO VERTEBRAL SEL VERTEBRAL BILAT MOD SED: IMG5369

## 2022-06-04 HISTORY — PX: IR ANGIO INTRA EXTRACRAN SEL COM CAROTID INNOMINATE BILAT MOD SED: IMG5360

## 2022-06-04 LAB — CBC
HCT: 45 % (ref 36.0–46.0)
Hemoglobin: 15 g/dL (ref 12.0–15.0)
MCH: 29.6 pg (ref 26.0–34.0)
MCHC: 33.3 g/dL (ref 30.0–36.0)
MCV: 88.9 fL (ref 80.0–100.0)
Platelets: 241 10*3/uL (ref 150–400)
RBC: 5.06 MIL/uL (ref 3.87–5.11)
RDW: 12.7 % (ref 11.5–15.5)
WBC: 8.8 10*3/uL (ref 4.0–10.5)
nRBC: 0 % (ref 0.0–0.2)

## 2022-06-04 LAB — BASIC METABOLIC PANEL
Anion gap: 9 (ref 5–15)
BUN: 12 mg/dL (ref 6–20)
CO2: 22 mmol/L (ref 22–32)
Calcium: 9 mg/dL (ref 8.9–10.3)
Chloride: 106 mmol/L (ref 98–111)
Creatinine, Ser: 0.88 mg/dL (ref 0.44–1.00)
GFR, Estimated: >60 mL/min (ref >60)
Glucose, Bld: 93 mg/dL (ref 70–99)
Potassium: 4.2 mmol/L (ref 3.5–5.1)
Sodium: 137 mmol/L (ref 135–145)

## 2022-06-04 LAB — PROTIME-INR
INR: 1 (ref 0.8–1.2)
Prothrombin Time: 13.3 seconds (ref 11.4–15.2)

## 2022-06-04 MED ORDER — SODIUM CHLORIDE 0.9 % IV BOLUS
250.0000 mL | Freq: Once | INTRAVENOUS | Status: AC
  Administered 2022-06-04: 250 mL via INTRAVENOUS

## 2022-06-04 MED ORDER — LIDOCAINE HCL 1 % IJ SOLN
INTRAMUSCULAR | Status: AC
  Administered 2022-06-04: 10 mL
  Filled 2022-06-04: qty 20

## 2022-06-04 MED ORDER — FENTANYL CITRATE (PF) 100 MCG/2ML IJ SOLN
INTRAMUSCULAR | Status: AC
  Filled 2022-06-04: qty 2

## 2022-06-04 MED ORDER — IOHEXOL 300 MG/ML  SOLN
100.0000 mL | Freq: Once | INTRAMUSCULAR | Status: AC | PRN
  Administered 2022-06-04: 50 mL via INTRA_ARTERIAL

## 2022-06-04 MED ORDER — MIDAZOLAM HCL 2 MG/2ML IJ SOLN
INTRAMUSCULAR | Status: AC | PRN
  Administered 2022-06-04: 1 mg via INTRAVENOUS

## 2022-06-04 MED ORDER — IOHEXOL 300 MG/ML  SOLN
50.0000 mL | Freq: Once | INTRAMUSCULAR | Status: AC | PRN
  Administered 2022-06-04: 10 mL via INTRA_ARTERIAL

## 2022-06-04 MED ORDER — FENTANYL CITRATE (PF) 100 MCG/2ML IJ SOLN
INTRAMUSCULAR | Status: AC | PRN
  Administered 2022-06-04: 25 ug via INTRAVENOUS

## 2022-06-04 MED ORDER — SODIUM CHLORIDE 0.9 % IV SOLN: INTRAVENOUS | Status: AC

## 2022-06-04 MED ORDER — HEPARIN SODIUM (PORCINE) 1000 UNIT/ML IJ SOLN
INTRAMUSCULAR | Status: AC | PRN
  Administered 2022-06-04: 1000 [IU] via INTRAVENOUS

## 2022-06-04 MED ORDER — SODIUM CHLORIDE 0.9 % IV SOLN: Freq: Once | INTRAVENOUS | Status: AC

## 2022-06-04 MED ORDER — HEPARIN SODIUM (PORCINE) 1000 UNIT/ML IJ SOLN
INTRAMUSCULAR | Status: AC
  Filled 2022-06-04: qty 10

## 2022-06-04 MED ORDER — MIDAZOLAM HCL 2 MG/2ML IJ SOLN
INTRAMUSCULAR | Status: AC
  Filled 2022-06-04: qty 2

## 2022-06-04 NOTE — Sedation Documentation (Signed)
Dr. Estanislado Pandy at bedside to discuss case and plan of care with pt.

## 2022-06-04 NOTE — Sedation Documentation (Signed)
Attempted to call SBAR to SS. Spoke with Santiago Glad. Will call back.

## 2022-06-04 NOTE — Procedures (Signed)
INR. Status post four-vessel cerebral arteriogram. Right CFA approach. Findings.   Approximately 50% stenosis of the right internal carotid artery proximally at the bulb.  No evidence of ulcerations or intraluminal filling defects. Previously  endovascularly treated right paraophthalmic region aneurysm remains obliterated without evidence of coill compaction.  Wide patency of the pipeline flow diverter across the neck of the aneurysm. Arlean Hopping MD.

## 2022-06-04 NOTE — Sedation Documentation (Signed)
Hailey, PA at bedside to discuss procedure and consent pt.

## 2022-06-04 NOTE — H&P (Signed)
Chief Complaint: Patient was seen in consultation today for cerebral aneurysm embolization follow up.    Supervising Physician: Julieanne Cotton  Patient Status: [facility] - Out-pt  History of Present Illness: Leslie Franklin is a 61 y.o. 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 Corliss Skains on 04/21/21.  Her primary symptom at the time had been persistent headaches. She also had some vision deficits but knew her prescription lenses were out of date.  She has been taking ASA 81 mg daily and 37.5mg  Plavix T,Th,Sa.  Her headaches are reportedly much improved and average approximately 1/mo that is resolved in 2-3 hours with laying down.  She describes is as pressure on the left side of head with changes in her vision.  She has had cataract surgery and wears updated reading lenses.  She presents to [facility] today for recommended follow up angiogram feeling well.  She does not decreased appetite and 11 lb weight loss in last 1 year.    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   Screening mammogram for breast cancer 09/12/2020   Stroke (HCC)    Syncope 09/12/2020    Past Surgical History:  Procedure Laterality Date   CATARACT EXTRACTION BILATERAL W/ ANTERIOR VITRECTOMY Bilateral 11/2021   IR ANGIO INTRA EXTRACRAN SEL COM CAROTID INNOMINATE BILAT MOD SED  04/07/2021   IR ANGIO INTRA EXTRACRAN SEL INTERNAL CAROTID UNI R MOD  SED  04/21/2021   IR ANGIO VERTEBRAL SEL SUBCLAVIAN INNOMINATE BILAT MOD SED  04/07/2021   IR ANGIOGRAM FOLLOW UP STUDY  04/21/2021   IR ANGIOGRAM FOLLOW UP STUDY  04/21/2021   IR CT HEAD LTD  04/21/2021   IR RADIOLOGIST EVAL & MGMT  03/12/2021   IR RADIOLOGIST EVAL & MGMT  05/07/2021   IR TRANSCATH/EMBOLIZ  04/21/2021   NO PAST SURGERIES     PACEMAKER IMPLANT N/A 10/21/2020   Procedure: PACEMAKER IMPLANT;  Surgeon: Marinus Maw, MD;  Location: [facility];  Service: Cardiovascular;  Laterality: N/A;   RADIOLOGY WITH ANESTHESIA N/A 04/07/2021   Procedure: EMBOLIZATION;  Surgeon: Julieanne Cotton, MD;  Location: [facility] OR;  Service: Radiology;  Laterality: N/A;   RADIOLOGY WITH ANESTHESIA N/A 04/21/2021   Procedure: RADIOLOGY WITH ANESTHESIA EMBOLIZATION;  Surgeon: Julieanne Cotton, MD;  Location: [facility] OR;  Service: Radiology;  Laterality: N/A;    Allergies: Patient has no known allergies.  Medications: Prior to Admission medications   Medication Sig Start Date End Date Taking? Authorizing Provider  aspirin 81 MG chewable tablet Chew 81 mg by mouth daily.   Yes [provider]  Budeson-Glycopyrrol-Formoterol (BREZTRI AEROSPHERE) 160-9-4.8 MCG/ACT AERO Inhale 2 puffs into the lungs 2 (two) times daily. Patient taking differently: Inhale 1 puff into the lungs 2 (two) times daily as needed (shortness of breath). 03/27/21  Yes [name], NP  clopidogrel (PLAVIX) 75 MG tablet Take 0.5 tablets (37.5 mg total) by mouth daily. Take 0.5 (half)  tablet by mouth on Tuesdays, Thursdays, and Saturdays only. 11/06/21  Yes Revankar, Reita Cliche, MD  escitalopram (LEXAPRO) 10 MG tablet TAKE 1 TABLET(10 MG) BY MOUTH DAILY 04/15/22  Yes Cox, Kirsten, MD  rosuvastatin (CRESTOR) 20 MG tablet TAKE 1 TABLET BY MOUTH ONCE DAILY 11/11/21  Yes Rip Harbour, NP     Family History  Problem Relation Age of Onset   Hypertension Mother    Diabetes Mother    Heart attack Father    Stroke  Father    Emphysema Sister    Breast cancer Daughter    Stroke Brother     Social History   Socioeconomic History   Marital status: Single    Spouse name: Not on file   Number of children: 4   Years of education: Not on file   Highest education level: Not on file  Occupational History   Not on file  Tobacco Use   Smoking status: Former    Packs/day: 1.00    Years: 41.00    Total pack years: 41.00    Types: Cigarettes    Quit date: 08/31/2020    Years since quitting: 1.7   Smokeless tobacco: Never  Vaping Use   Vaping Use: Never used  Substance and Sexual Activity   Alcohol use: Never   Drug use: Never   Sexual activity: Yes    Partners: Male    Birth control/protection: Post-menopausal  Other Topics Concern   Not on file  Social History Narrative   Not on file   Social Determinants of Health   Financial Resource Strain: Not on file  Food Insecurity: Not on file  Transportation Needs: Not on file  Physical Activity: Not on file  Stress: Not on file  Social Connections: Not on file    Review of Systems  Constitutional:  Positive for appetite change and unexpected weight change.  HENT: Negative.    Eyes: Negative.   Respiratory: Negative.    Cardiovascular: Negative.   Gastrointestinal: Negative.   Endocrine: Negative.   Genitourinary: Negative.   Musculoskeletal: Negative.   Allergic/Immunologic: Negative.   Neurological: Negative.   Psychiatric/Behavioral: Negative.      Vital Signs: BP (!) 178/83   Pulse 76   Temp (!) 97.4 F (36.3 C) (Temporal)   Resp 16   Ht 5\' 3"  (1.6 m)   Wt 103 lb (46.7 kg)   SpO2 97%   BMI 18.25 kg/m    Physical Exam Constitutional:      General: She is not in acute distress. HENT:     Mouth/Throat:     Mouth: Mucous membranes are moist.     Pharynx: Oropharynx is clear.  Eyes:     Extraocular Movements: Extraocular movements intact.     Conjunctiva/sclera: Conjunctivae normal.  Cardiovascular:     Rate and  Rhythm: Normal rate.     Pulses: Normal pulses.  Pulmonary:     Effort: Pulmonary effort is normal. No respiratory distress.  Abdominal:     General: Abdomen is flat.     Palpations: Abdomen is soft.  Skin:    General: Skin is dry.     Coloration: Skin is pale.  Neurological:     General: No focal deficit present.     Mental Status: She is alert and oriented to person, place, and time.  Psychiatric:        Mood and Affect: Mood normal.        Behavior: Behavior normal.  Imaging: No results found.  Labs:  CBC: Recent Labs    07/26/21 2335 10/09/21 0934 01/08/22 0922 06/04/22 0852  WBC 9.9 8.4 9.2 8.8  HGB 14.3 14.3 14.2 15.0  HCT 42.6 43.9 43.9 45.0  PLT 189 250 255 241    COAGS: Recent Labs    06/04/22 0852  INR 1.0    BMP: Recent Labs    07/26/21 2335 10/09/21 0934 01/08/22 0922  NA 134* 142 143  K 3.6 4.7 4.1  CL 98 104 106  CO2 19* 23 23  GLUCOSE 104* 83 87  BUN 23* 14 11  CALCIUM 9.0 10.0 9.3  CREATININE 1.48* 0.91 0.92  GFRNONAA 41*  --   --     LIVER FUNCTION TESTS: Recent Labs    07/26/21 2335 10/09/21 0934 01/08/22 0922  BILITOT 0.8 0.3 0.3  AST 53* 17 21  ALT 19 10 12   ALKPHOS 73 102 124*  PROT 6.9 6.7 6.6  ALBUMIN 3.6 4.5 4.4    Assessment and Plan:  Ms. Leslie Franklin is a pleasant, thin 61 year old female who is scheduled for diagnostic angiogram for surveillance of Rt ICA paraopthalmic aneurysm embolization performed with Dr. 46 on 04/21/21.  She is adherent to medications and presents with claims of decreased appetite/weight loss over the last year.  She does not attribute this to anything in particular.   Labs, imaging, and history reviewed.  Will plan to proceed with angiogram and discharge later today.  Risks and benefits of cerebral angiogram were discussed with the patient including, but not limited to bleeding, infection, vascular injury or contrast induced renal failure.  This interventional procedure  involves the use of X-rays and because of the nature of the planned procedure, it is possible that we will have prolonged use of X-ray fluoroscopy.  Potential radiation risks to you include (but are not limited to) the following: - A slightly elevated risk for cancer  several years later in life. This risk is typically less than 0.5% percent. This risk is low in comparison to the normal incidence of human cancer, which is 33% for women and 50% for men according to the [employer] Society. - Radiation induced injury can include skin redness, resembling a rash, tissue breakdown / ulcers and hair loss (which can be temporary or permanent).   The likelihood of either of these occurring depends on the difficulty of the procedure and whether you are sensitive to radiation due to previous procedures, disease, or genetic conditions.   IF your procedure requires a prolonged use of radiation, you will be notified and given written instructions for further action.  It is your responsibility to monitor the irradiated area for the 2 weeks following the procedure and to notify your physician if you are concerned that you have suffered a radiation induced injury.    All of the patient's questions were answered, patient is agreeable to proceed.  Consent signed and in chart.   Thank you for this interesting consult.  I greatly enjoyed meeting Leslie Franklin and look forward to participating in their care.  A copy of this report was sent to the requesting provider on this date.  Electronically Signed: [name], PA 06/04/2022, 9:24 AM   I spent a total of 25 Minutes in face to face in clinical consultation, greater than 50% of which was counseling/coordinating care for cerebral aneurysm.

## 2022-06-11 ENCOUNTER — Other Ambulatory Visit: Payer: Self-pay | Admitting: Nurse Practitioner

## 2022-06-11 DIAGNOSIS — E782 Mixed hyperlipidemia: Secondary | ICD-10-CM

## 2022-06-15 ENCOUNTER — Telehealth: Payer: Self-pay

## 2022-06-15 NOTE — Telephone Encounter (Signed)
Received the following transmission from [employer]:  Details for arrhythmia episode received (all types) Details were received for a AT episode, which was detected on Jun 12, 2022 10:39:44 AM  10/13 Episode appears to be 26 secs of new onset A Fib.  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  She takes an ASA daily but is not on an [place].  26 seconds can fall in the SCAF category, but with patient's med hx and likely high CHADSVASC score, will defer to Dr. Curt Bears for his recommendations.

## 2022-07-14 ENCOUNTER — Other Ambulatory Visit: Payer: Self-pay | Admitting: Cardiology

## 2022-07-20 ENCOUNTER — Ambulatory Visit (INDEPENDENT_AMBULATORY_CARE_PROVIDER_SITE_OTHER): Payer: Medicaid Other

## 2022-07-20 DIAGNOSIS — I442 Atrioventricular block, complete: Secondary | ICD-10-CM | POA: Diagnosis not present

## 2022-07-21 LAB — CUP PACEART REMOTE DEVICE CHECK
Battery Voltage: 85
Brady Statistic RA Percent Paced: 38 %
Brady Statistic RV Percent Paced: 0 %
Date Time Interrogation Session: 20231120095348
Implantable Lead Connection Status: 753985
Implantable Lead Connection Status: 753985
Implantable Lead Implant Date: 20220221
Implantable Lead Implant Date: 20220221
Implantable Lead Location: 753859
Implantable Lead Location: 753860
Implantable Lead Model: 377171
Implantable Lead Model: 377171
Implantable Lead Serial Number: 8000187789
Implantable Lead Serial Number: 8000197459
Implantable Pulse Generator Implant Date: 20220221
Pulse Gen Model: 407145
Pulse Gen Serial Number: 70053960

## 2022-08-03 ENCOUNTER — Encounter: Payer: Self-pay | Admitting: Nurse Practitioner

## 2022-08-03 ENCOUNTER — Ambulatory Visit: Payer: Medicaid Other | Admitting: Nurse Practitioner

## 2022-08-03 VITALS — BP 122/68 | HR 77 | Temp 97.5°F | Ht 60.0 in | Wt 100.1 lb

## 2022-08-03 DIAGNOSIS — Z2820 Immunization not carried out because of patient decision for unspecified reason: Secondary | ICD-10-CM

## 2022-08-03 DIAGNOSIS — F331 Major depressive disorder, recurrent, moderate: Secondary | ICD-10-CM | POA: Diagnosis not present

## 2022-08-03 DIAGNOSIS — E782 Mixed hyperlipidemia: Secondary | ICD-10-CM

## 2022-08-03 DIAGNOSIS — F419 Anxiety disorder, unspecified: Secondary | ICD-10-CM | POA: Diagnosis not present

## 2022-08-03 DIAGNOSIS — E559 Vitamin D deficiency, unspecified: Secondary | ICD-10-CM

## 2022-08-03 DIAGNOSIS — F17211 Nicotine dependence, cigarettes, in remission: Secondary | ICD-10-CM

## 2022-08-03 HISTORY — DX: Immunization not carried out because of patient decision for unspecified reason: Z28.20

## 2022-08-03 HISTORY — DX: Vitamin D deficiency, unspecified: E55.9

## 2022-08-03 IMAGING — MR MR HEAD W/O CM
9 of 10 series · 35 of 48 positions shown · non-contrast
Comparison: Noncontrast head CT 11/12/2020.

CLINICAL DATA: Cerebrovascular accident (CVA), unspecified
mechanism. Stroke, follow-up.

EXAM:
MRI HEAD WITHOUT CONTRAST
MRA HEAD WITHOUT CONTRAST
TECHNIQUE: Multiplanar, multi-echo pulse sequences of the brain and surrounding
structures were acquired without intravenous contrast. Angiographic
images of the Circle of Willis were acquired using MRA technique
without intravenous contrast.

[Series 4: DWI · axial · 3.0mm · 1.09mm/px · z∈[-77,+86]mm · 9 of 114 slices shown (1 of 4)]
[im 1/114]
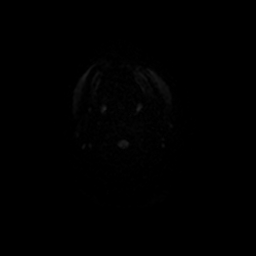
[im 21/114]
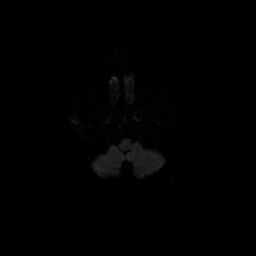
[im 31/114]
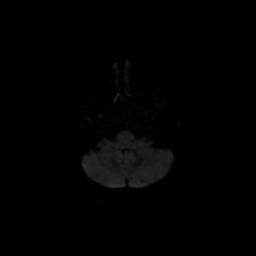
[im 52/114]
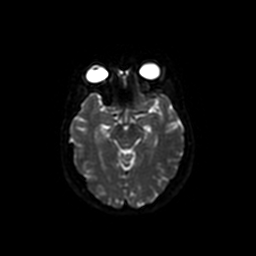
[im 62/114]
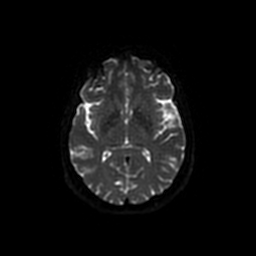
[im 83/114]
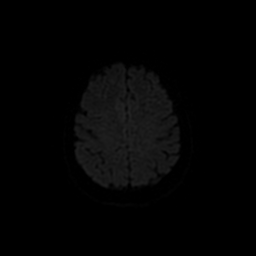
[im 93/114]
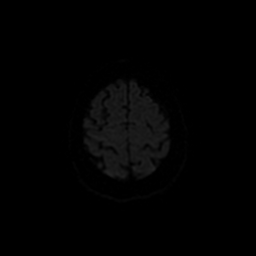
[im 103/114]
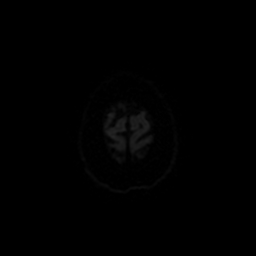
[im 114/114]
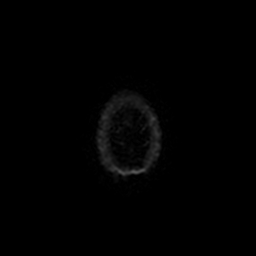

[Series 5: DWI · coronal · 5.0mm · 1.09mm/px · 7 of 77 slices shown (2 of 4)]
[im 1/77]
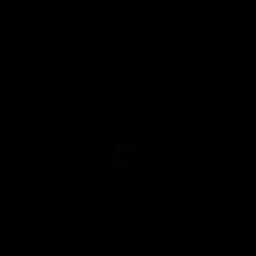
[im 13/77]
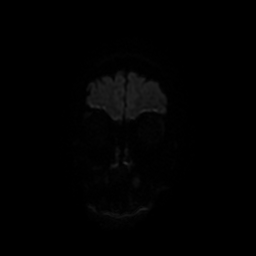
[im 26/77]
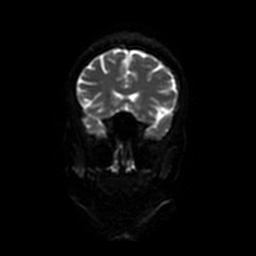
[im 39/77]
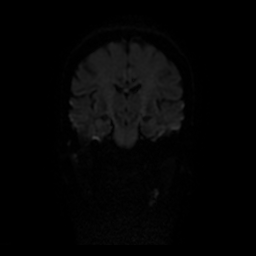
[im 51/77]
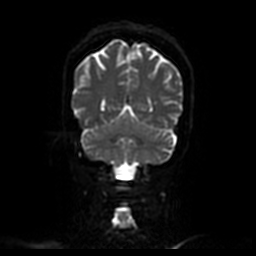
[im 64/77]
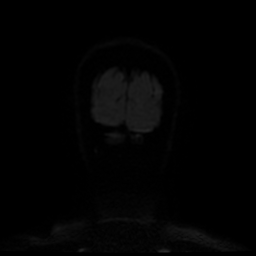
[im 77/77]
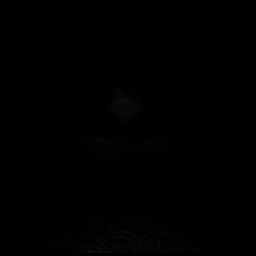

[Series 6: T1 · sagittal · 5.0mm · 0.47mm/px · 2 of 23 slices shown (1 of 2)]
[im 1/23]
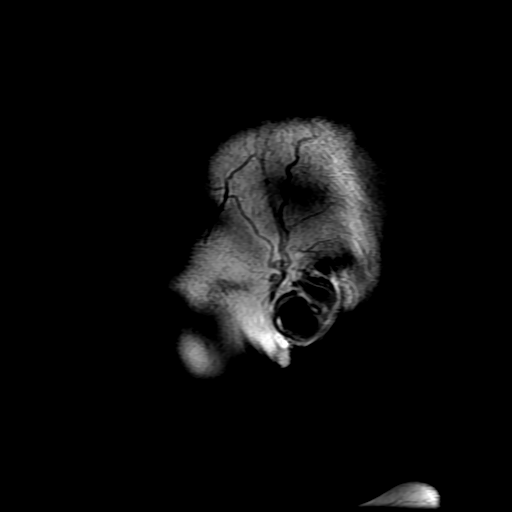
[im 23/23]
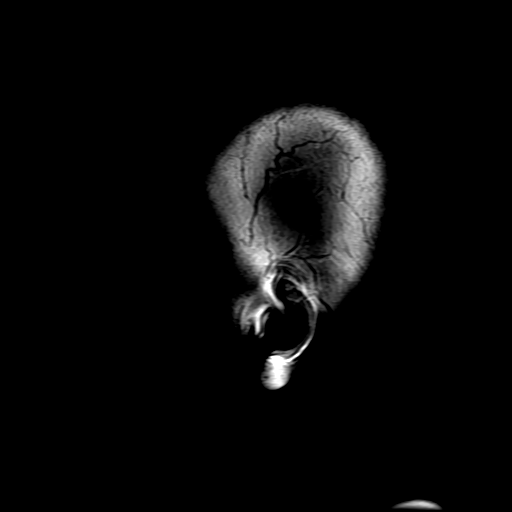

[Series 7: T2 · axial · 5.0mm · 0.43mm/px · z∈[-51,+97]mm · 2 of 26 slices shown (1 of 2)]
[im 1/26]
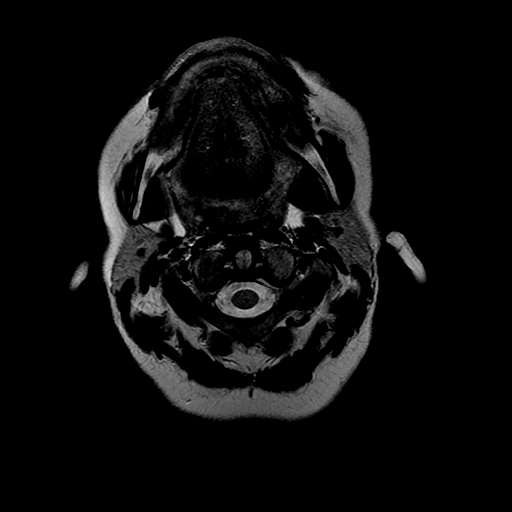
[im 26/26]
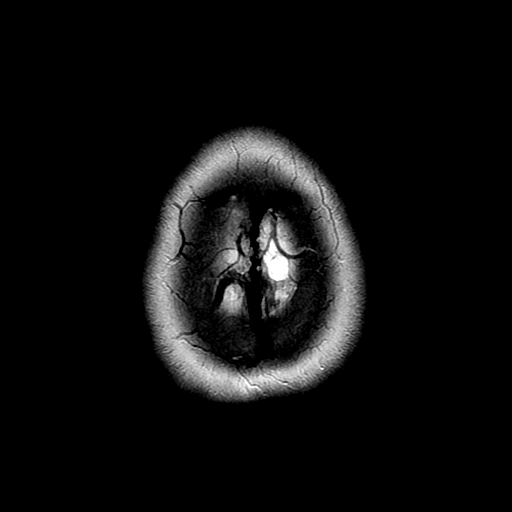

[Series 8: FLAIR · axial · 3.0mm · 0.43mm/px · z∈[-51,+97]mm · 2 of 26 slices shown]
[im 1/26]
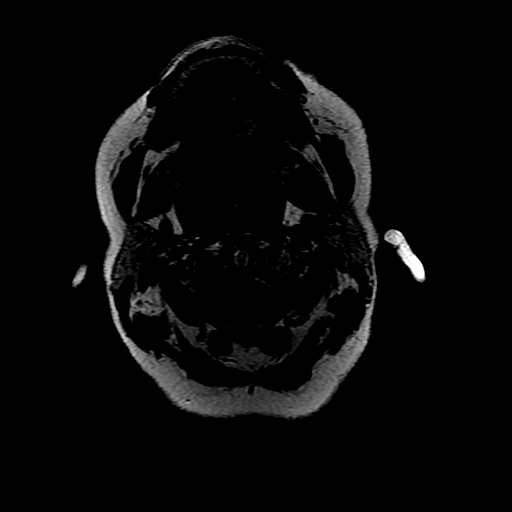
[im 26/26]
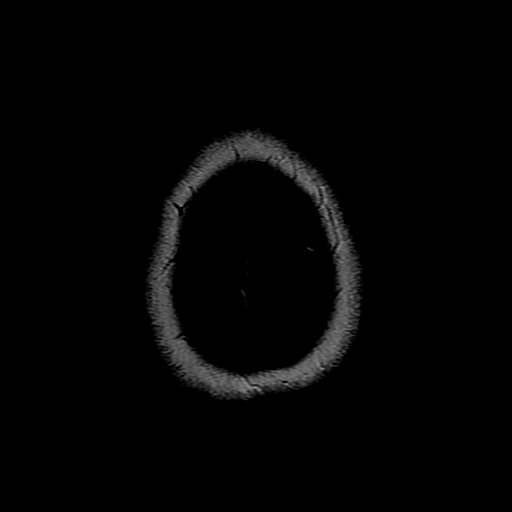

[Series 10: T1 · axial · 3.0mm · 0.43mm/px · z∈[-53,-36]mm · 2 of 104 slices shown (2 of 2)]
[im 1/104]
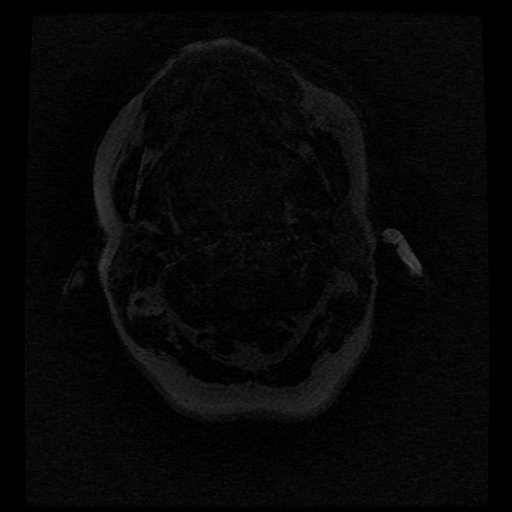
[im 12/104]
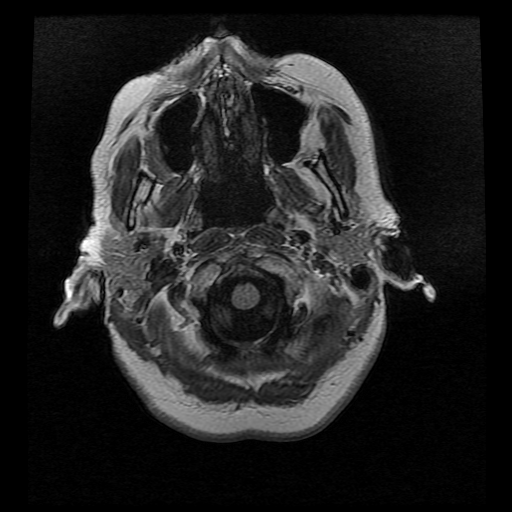

[Series 11: T2 · coronal · 5.0mm · 0.39mm/px · 2 of 24 slices shown (2 of 2)]
[im 1/24]
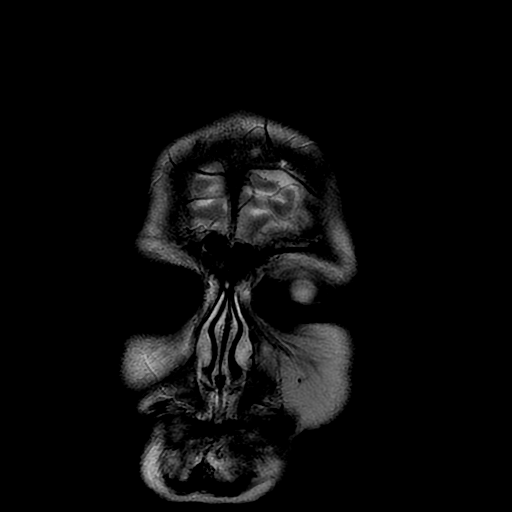
[im 24/24]
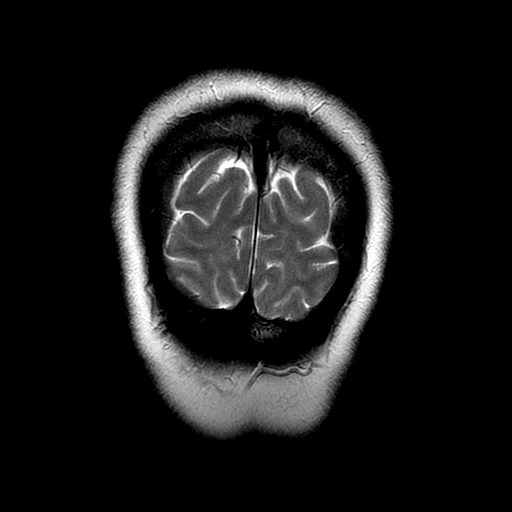

[Series 400: DWI · axial · 3.0mm · 1.09mm/px · z∈[-77,+86]mm · 5 of 57 slices shown (3 of 4)]
[im 1/57]
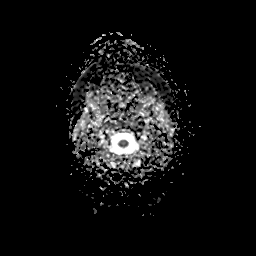
[im 15/57]
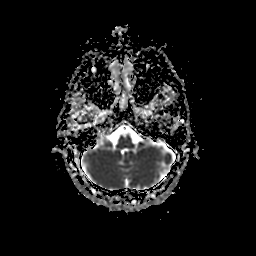
[im 29/57]
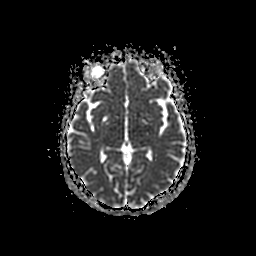
[im 43/57]
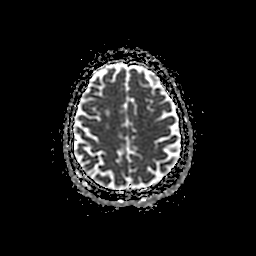
[im 57/57]
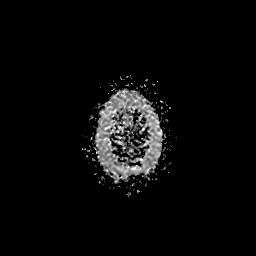

[Series 500: DWI · coronal · 5.0mm · 1.09mm/px · 4 of 39 slices shown (4 of 4)]
[im 1/39]
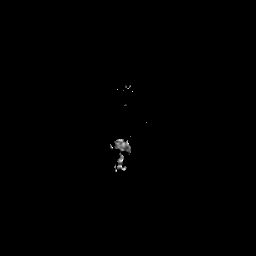
[im 13/39]
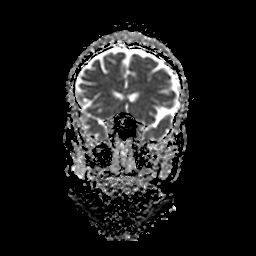
[im 26/39]
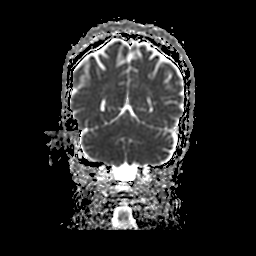
[im 39/39]
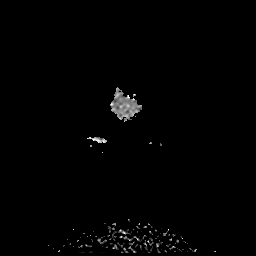

[35 of 48 positions shown; findings below may reference images not displayed]

FINDINGS: MRI HEAD FINDINGS

Brain:

Mild generalized cerebral and cerebellar atrophy.

Redemonstrated small chronic cortical/subcortical infarct within the
right middle frontal gyrus.

Mild multifocal T2/FLAIR hyperintensity elsewhere within the
cerebral white matter, nonspecific but compatible chronic small
vessel ischemic disease.

Three chronic lacunar infarcts within the left cerebellar
hemisphere.

There is no acute infarct.

No evidence of an intracranial mass.

No chronic intracranial blood products.

No extra-axial fluid collection.

No midline shift.

Vascular: Expected proximal arterial flow voids.

Skull and upper cervical spine: No focal marrow lesion. Visualized
orbits show no acute finding. Small fluid level within the left
sphenoid sinus.

Sinuses/Orbits: Fluid within the bilateral mastoid air cells.

MRA HEAD FINDINGS

Anterior circulation:

The intracranial internal carotid arteries are patent. The M1 middle
cerebral arteries are patent. No M2 proximal branch occlusion or
high-grade proximal stenosis is identified. The anterior cerebral
arteries are patent. 10 x 7 mm aneurysm arising from the
cavernous/paraclinoid right ICA (for instance as seen on series 3,
image 84). 1-2 mm medially projecting aneurysm arising from the
cavernous left ICA (series 3, image 76).

Posterior circulation:

The intracranial vertebral arteries are patent. The basilar artery
is patent. The posterior cerebral arteries are patent. Posterior
communicating arteries are present bilaterally.

Anatomic variants: None significant

MRA head impression #2 will be called to the ordering clinician or
representative by the Radiologist Assistant, and communication
documented in the PACS or [REDACTED].
IMPRESSION: MRI brain:

1. No evidence of acute intracranial abnormality.
2. Redemonstrated small chronic cortical/subcortical infarct within
the right middle frontal gyrus.
3. Mild chronic small vessel ischemic changes within the cerebral
white matter.
4. Three chronic lacunar infarcts within the right cerebellar
hemisphere.
5. Mild generalized parenchymal atrophy.
6. Left sphenoid sinusitis.
7. Trace fluid within the bilateral mastoid air cells.

MRA head:

1. No intracranial large vessel occlusion or proximal high-grade
arterial stenosis.
2. 10 x 7 mm aneurysm arising from the cavernous/paraclinoid right
internal carotid artery. Neuro-interventional consultation is
recommended.
3. 1-2 mm aneurysm arising from the cavernous left ICA.

## 2022-08-03 MED ORDER — ESCITALOPRAM OXALATE 20 MG PO TABS: 20.0000 mg | ORAL_TABLET | Freq: Every day | ORAL | 0 refills | Status: DC

## 2022-08-03 NOTE — Patient Instructions (Addendum)
We will call you with lab results and lung cancer screening   Increase Lexapro to 20mg  daily  Recommend counselling Follow up in 4 weeks to see the effect of changing dose of Lexapro.     Preventing Vitamin D Deficiency Vitamin D deficiency is when your body does not have enough vitamin D. Vitamin D is important because it helps your body maintain calcium and phosphorus levels. It plays a key role in the health of bones and teeth, reduces inflammation, and improves the body's defense system (immune system). Our bodies make vitamin D when our skin is exposed to direct sunlight. However, for many people, this may not be enough vitamin D to meet the body's needs. How can this condition affect me? If vitamin D deficiency is severe, it can cause a condition in which a person's bones become softer than normal. In adults, this condition is called osteomalacia. In children, this condition is called rickets. Vitamin D deficiency can also cause weak or thin bones (osteoporosis) in adults. What can increase my risk? You may be at risk for a vitamin D deficiency if you: Are pregnant. Are obese. Are an older adult. Have dark skin. Take certain medicines that affect the way vitamin D is absorbed. Have had a surgery in which a part of the stomach or a part of the small intestine was removed. Other risk factors include: Having a condition that limits your ability to absorb fat, such as Crohn's disease, long-term (chronic) pancreatitis, or cystic fibrosis. Having certain conditions that are passed from parent to child (inherited). Not having access to foods rich in vitamin D. Having limited ability to move and go outside safely. Living in areas that have fewer hours of sunlight. Spending most of your day indoors, or covering your skin all the time when you are outdoors. What actions can I take to reduce my risk of a vitamin D deficiency? Knowing the best sources of vitamin D You can meet your daily  vitamin D needs from: Foods. Dietary supplements. Direct exposure to natural sunlight. Infant formula, for infants. Knowing how much vitamin D you need General recommendations for daily vitamin D intake vary by these categories: Infants: 400 international units (IU). Children older than 1 year: 600 international units. Adults: 600 international units. Pregnant and breastfeeding women: 600 international units. Adults older than 70 years: 800 international units. These are minimum levels of recommended amounts. Your health care provider may recommend a different amount of vitamin D intake based on your specific needs and your overall health. Getting sun exposure Get regular, safe exposure to natural sunlight. Expose your skin to direct sunlight for at least 15 minutes every day. If you have dark skin, you may need to expose your skin for a longer period of time. Protect your skin from too much sun exposure. This helps to prevent skin cancer. Ask your health care provider if regular sun exposure is safe for you. Do not use a tanning bed. Eating and drinking  Eat foods that naturally contain vitamin D. These include: Beef liver. Eggs. The vitamin D is in the yolk. Fish, such as salmon or trout. Mushrooms that were treated with UV light. Eat or drink products that have vitamin D added to them (are fortified). These may include: Cereals. Milk, including plant-based alternatives such as almond, soy, or oat milks. Orange juice. Margarine. When choosing foods, check the food label on the package to see: How much vitamin D is in the item. If the food is fortified  with vitamin D. The items listed above may not be a complete list of foods and beverages you can eat and drink. Contact a dietitian for more information. Taking supplements and medicines If you are at risk for vitamin D deficiency, or if you have certain diseases, your health care provider may recommend that you take a vitamin D  supplement. Make sure you: Talk with your health care provider before you start taking any vitamin D supplements. You may be more sensitive to the side effects of vitamin D supplements if you are on certain medicines or have certain medical conditions. Tell your health care provider about all medicines you are taking, including vitamins, herbs, eye drops, creams, and over-the-counter medicines. Take over-the-counter and prescription medicines only as told by your health care provider. Take supplements only as told by your health care provider. To increase absorption of your supplement, take it with a meal or snack. Summary Vitamin D plays a key role in the health of bones and teeth, reduces inflammation, and improves the body's defense system (immune system). A vitamin D deficiency can put you at risk of developing conditions such as rickets or osteoporosis. Our bodies make vitamin D when our skin is exposed to direct sunlight. However, for many people, this may not be enough vitamin D to meet the body's needs. Some foods naturally contain vitamin D, including beef liver, egg yolk, and fish. Eat or drink products that have vitamin D added to them (are fortified). This information is not intended to replace advice given to you by your health care provider. Make sure you discuss any questions you have with your health care provider. Document Revised: 05/23/2021 Document Reviewed: 05/23/2021 Elsevier Patient Education  2023 [employer].

## 2022-08-03 NOTE — Progress Notes (Signed)
Subjective:  Patient ID: Leslie Franklin, female    DOB: Mar 20, 1961  Age: 61 y.o. MRN: [id number]  CHIEF COMPLAINT:  Hyperlipidemia  Anxiety  Vitamin D deficiency   HPI:       Patient presents for chronic follow up for hyperlipidemia. Anxiety and Vitamin D deficiency Lipid/Cholesterol, Follow-up  Last lipid panel Other pertinent labs  Lab Results  Component Value Date   CHOL 136 01/08/2022   HDL 58 01/08/2022   LDLCALC 65 01/08/2022   TRIG 59 01/08/2022   CHOLHDL 2.3 01/08/2022   Lab Results  Component Value Date   ALT 12 01/08/2022   AST 21 01/08/2022   PLT 241 06/04/2022   TSH 2.150 10/09/2021     She was last seen for this 6 months ago.  Management includes Crestor 20 mg OD.  She reports good compliance with treatment. She is not having side effects.  Current diet: well balanced Current exercise: walking   Vitamin D deficiency, follow-up  Lab Results  Component Value Date   VD25OH 32.1 01/08/2022   VD25OH 26.6 (L) 10/09/2021   CALCIUM 9.0 06/04/2022   CALCIUM 9.3 01/08/2022   Wt Readings from Last 3 Encounters:  06/04/22 103 lb (46.7 kg)  05/14/22 103 lb 9.6 oz (47 kg)  05/11/22 102 lb 12.8 oz (46.6 kg)    She was last seen for vitamin D deficiency 6 months ago.  Management since that visit includes None  -   Anxiety, Follow-up  She was last seen for anxiety 5 months ago. Current treatment includes Lexapro 10 mg OD.   She reports good compliance with treatment. She reports good tolerance of treatment. She is not having side effects.  She feels her anxiety is moderate and Worse since last visit.    GAD-7 Results    01/08/2022    8:20 AM 10/09/2021    9:07 AM 07/08/2021    9:41 AM  GAD-7 Generalized Anxiety Disorder Screening Tool  1. Feeling Nervous, Anxious, or on Edge 0 1 1  2. Not Being Able to Stop or Control Worrying 1 1 0  3. Worrying Too Much About Different Things 3 1 1   4. Trouble Relaxing 1 1 3   5. Being So Restless it's  Hard To Sit Still 1 0 0  6. Becoming Easily Annoyed or Irritable 1 1 3   7. Feeling Afraid As If Something Awful Might Happen 0 0 0  Total GAD-7 Score 7 5 8   Difficulty At Work, Home, or Getting  Along With Others? Not difficult at all Not difficult at all Somewhat difficult    PHQ-9 Scores    08/03/2022    7:43 AM 01/08/2022    8:10 AM 10/09/2021    9:06 AM  PHQ9 SCORE ONLY  PHQ-9 Total Score 13 6 3       Current Outpatient Medications on File Prior to Visit  Medication Sig Dispense Refill   aspirin 81 MG chewable tablet Chew 81 mg by mouth daily.     Budeson-Glycopyrrol-Formoterol (BREZTRI AEROSPHERE) 160-9-4.8 MCG/ACT AERO Inhale 2 puffs into the lungs 2 (two) times daily. (Patient taking differently: Inhale 1 puff into the lungs 2 (two) times daily as needed (shortness of breath).) 10.7 g 0   clopidogrel (PLAVIX) 75 MG tablet TAKE 1/2 TABLET BY MOUTH DAILY ON TUESDAYS, THURSDAYS AND SATURDAYS ONLY 36 tablet 0   escitalopram (LEXAPRO) 10 MG tablet TAKE 1 TABLET(10 MG) BY MOUTH DAILY 90 tablet 1   rosuvastatin (CRESTOR) 20 MG tablet TAKE 1 TABLET  BY MOUTH EVERY DAY 90 tablet 1   No current facility-administered medications on file prior to visit.   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   Screening mammogram for breast cancer 09/12/2020   Stroke (HCC)    Syncope 09/12/2020   Past Surgical History:  Procedure Laterality Date   CATARACT EXTRACTION  BILATERAL W/ ANTERIOR VITRECTOMY Bilateral 11/2021   IR ANGIO INTRA EXTRACRAN SEL COM CAROTID INNOMINATE BILAT MOD SED  04/07/2021   IR ANGIO INTRA EXTRACRAN SEL COM CAROTID INNOMINATE BILAT MOD SED  06/04/2022   IR ANGIO INTRA EXTRACRAN SEL INTERNAL CAROTID UNI R MOD SED  04/21/2021   IR ANGIO VERTEBRAL SEL SUBCLAVIAN INNOMINATE BILAT MOD SED  04/07/2021   IR ANGIO VERTEBRAL SEL VERTEBRAL BILAT MOD SED  06/04/2022   IR ANGIOGRAM FOLLOW UP STUDY  04/21/2021   IR ANGIOGRAM FOLLOW UP STUDY  04/21/2021   IR CT HEAD LTD  04/21/2021   IR RADIOLOGIST EVAL & MGMT  03/12/2021   IR RADIOLOGIST EVAL & MGMT  05/07/2021   IR TRANSCATH/EMBOLIZ  04/21/2021   NO PAST SURGERIES     PACEMAKER IMPLANT N/A 10/21/2020   Procedure: PACEMAKER IMPLANT;  Surgeon: Marinus Maw, MD;  Location: [facility];  Service: Cardiovascular;  Laterality: N/A;   RADIOLOGY WITH ANESTHESIA N/A 04/07/2021   Procedure: EMBOLIZATION;  Surgeon: Julieanne Cotton, MD;  Location: [facility];  Service: Radiology;  Laterality: N/A;   RADIOLOGY WITH ANESTHESIA N/A 04/21/2021   Procedure: RADIOLOGY WITH ANESTHESIA EMBOLIZATION;  Surgeon: Julieanne Cotton, MD;  Location: [facility];  Service: Radiology;  Laterality: N/A;    Family History  Problem Relation Age of Onset   Hypertension Mother    Diabetes Mother    Heart attack Father    Stroke Father    Emphysema Sister    Breast cancer Daughter    Stroke Brother    Social History   Socioeconomic History   Marital status: Single    Spouse name: Not on file   Number of children: 4   Years of education: Not on file   Highest education level: Not on file  Occupational History   Not on file  Tobacco Use   Smoking status: Former    Packs/day: 1.00    Years: 41.00    Total pack years: 41.00    Types: Cigarettes    Quit date: 08/31/2020    Years since quitting: 1.9   Smokeless tobacco: Never  Vaping Use   Vaping Use: Never used  Substance and Sexual Activity   Alcohol use:  Never   Drug use: Never   Sexual activity: Yes    Partners: Male    Birth control/protection: Post-menopausal  Other Topics Concern   Not on file  Social History Narrative   Not on file   Social Determinants of Health   Financial Resource Strain: Not on file  Food Insecurity: Not on file  Transportation Needs: Not on file  Physical Activity: Not on file  Stress: Not on file  Social Connections: Not on file    Review of Systems  Constitutional:  Positive for appetite change (decreased) and unexpected weight change (weight loss).  Psychiatric/Behavioral:  The patient is nervous/anxious.      Objective:      06/04/2022   12:44 PM 06/04/2022   12:29 PM 06/04/2022   12:14 PM  BP/Weight  Systolic BP 143 133 142  Diastolic BP 79 85 85    Physical Exam HENT:     Right Ear: Tympanic membrane normal.     Left Ear: Tympanic membrane normal.     Nose: Nose normal.     Mouth/Throat:     Mouth: Mucous membranes are moist.  Cardiovascular:     Rate and Rhythm: Normal rate and regular rhythm.     Heart sounds: Normal heart sounds.  Pulmonary:     Effort: Pulmonary effort is normal.  Abdominal:     General: Bowel sounds are normal.     Palpations: Abdomen is soft.  Skin:    General: Skin is warm.  Neurological:     Mental Status: She is alert and oriented to person, place, and time.  Psychiatric:        Mood and Affect: Mood normal.   BP 122/68   Pulse 77   Temp (!) 97.5 F (36.4 C)   Ht 5' (1.524 m)   Wt 100 lb 1.6 oz (45.4 kg)   SpO2 99%   BMI 19.55 kg/m    Diabetic Foot Exam - Simple   No data filed      Lab Results  Component Value Date   WBC 8.8 06/04/2022   HGB 15.0 06/04/2022   HCT 45.0 06/04/2022   PLT 241 06/04/2022   GLUCOSE 93 06/04/2022   CHOL 136 01/08/2022   TRIG 59 01/08/2022   HDL 58 01/08/2022   LDLCALC 65 01/08/2022   ALT 12 01/08/2022   AST 21 01/08/2022   NA 137 06/04/2022   K 4.2 06/04/2022   CL 106 06/04/2022   CREATININE  0.88 06/04/2022   BUN 12 06/04/2022   CO2 22 06/04/2022   TSH 2.150 10/09/2021   INR 1.0 06/04/2022      Assessment & Plan:    1. Mixed hyperlipidemia  - CBC with Differential/Platelet - Comprehensive metabolic panel - Lipid panel  2. Anxiety  - escitalopram (LEXAPRO) 20 MG tablet; Take 1 tablet (20 mg total) by mouth daily.  Dispense: 30 tablet; Refill: 0  3. Moderate episode of recurrent major depressive disorder (HCC)  - escitalopram (LEXAPRO) 20 MG tablet; Take 1 tablet (20 mg total) by mouth daily.  Dispense: 30 tablet; Refill: 0  4. Vitamin D deficiency  - Vitamin D, 25-hydroxy  5. Cigarette nicotine dependence in remission  - CT CHEST LUNG CANCER SCREENING LOW DOSE WO CONTRAST; Future  6. Vaccine refused by patient   Increase Lexapro to 20mg  daily  Recommend counselling Follow up in 4 weeks to see the effect of changing dose of Lexapro.  Follow-up:  in 4 weeks  An After Visit Summary was printed and given to the patient.   I, , FNP, have reviewed all documentation for this visit. The documentation on 08/03/22 for the exam, diagnosis, procedures, and orders are all accurate and complete.    14/04/23, FNP [facility] [phone]

## 2022-08-04 LAB — COMPREHENSIVE METABOLIC PANEL
ALT: 7 IU/L (ref 0–32)
AST: 16 IU/L (ref 0–40)
Albumin/Globulin Ratio: 2.2 (ref 1.2–2.2)
Albumin: 4.3 g/dL (ref 3.8–4.9)
Alkaline Phosphatase: 101 IU/L (ref 44–121)
BUN/Creatinine Ratio: 20 (ref 9–23)
BUN: 18 mg/dL (ref 6–24)
Bilirubin Total: 0.3 mg/dL (ref 0.0–1.2)
CO2: 21 mmol/L (ref 20–29)
Calcium: 9.6 mg/dL (ref 8.7–10.2)
Chloride: 106 mmol/L (ref 96–106)
Creatinine, Ser: 0.89 mg/dL (ref 0.57–1.00)
Globulin, Total: 2 g/dL (ref 1.5–4.5)
Glucose: 90 mg/dL (ref 70–99)
Potassium: 3.8 mmol/L (ref 3.5–5.2)
Sodium: 142 mmol/L (ref 134–144)
Total Protein: 6.3 g/dL (ref 6.0–8.5)
eGFR: 75 mL/min/{1.73_m2} (ref >59)

## 2022-08-04 LAB — CBC WITH DIFFERENTIAL/PLATELET
Basophils Absolute: 0.1 10*3/uL (ref 0.0–0.2)
Basos: 1 %
EOS (ABSOLUTE): 0.2 10*3/uL (ref 0.0–0.4)
Eos: 2 %
Hematocrit: 43.2 % (ref 34.0–46.6)
Hemoglobin: 13.9 g/dL (ref 11.1–15.9)
Immature Grans (Abs): 0 10*3/uL (ref 0.0–0.1)
Immature Granulocytes: 0 %
Lymphocytes Absolute: 2.6 10*3/uL (ref 0.7–3.1)
Lymphs: 31 %
MCH: 29.4 pg (ref 26.6–33.0)
MCHC: 32.2 g/dL (ref 31.5–35.7)
MCV: 92 fL (ref 79–97)
Monocytes Absolute: 0.5 10*3/uL (ref 0.1–0.9)
Monocytes: 6 %
Neutrophils Absolute: 5.1 10*3/uL (ref 1.4–7.0)
Neutrophils: 60 %
Platelets: 227 10*3/uL (ref 150–450)
RBC: 4.72 x10E6/uL (ref 3.77–5.28)
RDW: 12.7 % (ref 11.7–15.4)
WBC: 8.5 10*3/uL (ref 3.4–10.8)

## 2022-08-04 LAB — LIPID PANEL
Chol/HDL Ratio: 2.9 ratio (ref 0.0–4.4)
Cholesterol, Total: 156 mg/dL (ref 100–199)
HDL: 53 mg/dL (ref >39)
LDL Chol Calc (NIH): 89 mg/dL (ref 0–99)
Triglycerides: 74 mg/dL (ref 0–149)
VLDL Cholesterol Cal: 14 mg/dL (ref 5–40)

## 2022-08-04 LAB — VITAMIN D 25 HYDROXY (VIT D DEFICIENCY, FRACTURES): Vit D, 25-Hydroxy: 29.1 ng/mL — ABNORMAL LOW (ref 30.0–100.0)

## 2022-08-04 LAB — CARDIOVASCULAR RISK ASSESSMENT

## 2022-08-26 DIAGNOSIS — Z87891 Personal history of nicotine dependence: Secondary | ICD-10-CM | POA: Diagnosis not present

## 2022-08-26 DIAGNOSIS — J432 Centrilobular emphysema: Secondary | ICD-10-CM | POA: Diagnosis not present

## 2022-08-26 DIAGNOSIS — I2510 Atherosclerotic heart disease of native coronary artery without angina pectoris: Secondary | ICD-10-CM | POA: Diagnosis not present

## 2022-08-26 DIAGNOSIS — I700 Atherosclerosis of aorta: Secondary | ICD-10-CM | POA: Diagnosis not present

## 2022-08-26 DIAGNOSIS — Z122 Encounter for screening for malignant neoplasm of respiratory organs: Secondary | ICD-10-CM | POA: Diagnosis not present

## 2022-08-28 ENCOUNTER — Other Ambulatory Visit: Payer: Self-pay

## 2022-08-28 DIAGNOSIS — F17211 Nicotine dependence, cigarettes, in remission: Secondary | ICD-10-CM

## 2022-09-01 NOTE — Progress Notes (Signed)
Remote pacemaker transmission.   

## 2022-09-07 ENCOUNTER — Encounter: Payer: Self-pay | Admitting: Nurse Practitioner

## 2022-09-07 ENCOUNTER — Ambulatory Visit: Payer: Medicaid Other | Admitting: Nurse Practitioner

## 2022-09-07 VITALS — BP 118/72 | HR 84 | Temp 97.5°F | Ht 63.0 in | Wt 98.0 lb

## 2022-09-07 DIAGNOSIS — R519 Headache, unspecified: Secondary | ICD-10-CM

## 2022-09-07 DIAGNOSIS — F321 Major depressive disorder, single episode, moderate: Secondary | ICD-10-CM | POA: Diagnosis not present

## 2022-09-07 DIAGNOSIS — Z681 Body mass index (BMI) 19 or less, adult: Secondary | ICD-10-CM | POA: Diagnosis not present

## 2022-09-07 DIAGNOSIS — Z8679 Personal history of other diseases of the circulatory system: Secondary | ICD-10-CM | POA: Diagnosis not present

## 2022-09-07 DIAGNOSIS — G8929 Other chronic pain: Secondary | ICD-10-CM

## 2022-09-07 DIAGNOSIS — R634 Abnormal weight loss: Secondary | ICD-10-CM

## 2022-09-07 DIAGNOSIS — F411 Generalized anxiety disorder: Secondary | ICD-10-CM

## 2022-09-07 MED ORDER — ONDANSETRON HCL 4 MG PO TABS: 4.0000 mg | ORAL_TABLET | Freq: Three times a day (TID) | ORAL | 0 refills | Status: DC | PRN

## 2022-09-07 MED ORDER — BUSPIRONE HCL 10 MG PO TABS: 10.0000 mg | ORAL_TABLET | Freq: Two times a day (BID) | ORAL | 0 refills | Status: DC

## 2022-09-07 MED ORDER — LORAZEPAM 0.5 MG PO TABS: 0.5000 mg | ORAL_TABLET | Freq: Three times a day (TID) | ORAL | 0 refills | Status: DC

## 2022-09-07 NOTE — Patient Instructions (Addendum)
Resume Buspar 10 mg twice daily Begin Ativan 0.5 mg twice daily as needed for anxiety Take Zofran as needed for nausea Recommend counseling We will call you with CT of head appt and lab results Notify specialist of chronic headaches and weight loss Increase calorie intake (consider Ensure or similar product)  Managing Anxiety, Adult After being diagnosed with anxiety, you may be relieved to know why you have felt or behaved a certain way. You may also feel overwhelmed about the treatment ahead and what it will mean for your life. With care and support, you can manage this condition. How to manage lifestyle changes Managing stress and anxiety  Stress is your body's reaction to life changes and events, both good and bad. Most stress will last just a few hours, but stress can be ongoing and can lead to more than just stress. Although stress can play a major role in anxiety, it is not the same as anxiety. Stress is usually caused by something external, such as a deadline, test, or competition. Stress normally passes after the triggering event has ended.  Anxiety is caused by something internal, such as imagining a terrible outcome or worrying that something will go wrong that will devastate you. Anxiety often does not go away even after the triggering event is over, and it can become long-term (chronic) worry. It is important to understand the differences between stress and anxiety and to manage your stress effectively so that it does not lead to an anxious response. Talk with your health care provider or a counselor to learn more about reducing anxiety and stress. He or she may suggest tension reduction techniques, such as: Music therapy. Spend time creating or listening to music that you enjoy and that inspires you. Mindfulness-based meditation. Practice being aware of your normal breaths while not trying to control your breathing. It can be done while sitting or walking. Centering prayer. This  involves focusing on a word, phrase, or sacred image that means something to you and brings you peace. Deep breathing. To do this, expand your stomach and inhale slowly through your nose. Hold your breath for 3-5 seconds. Then exhale slowly, letting your stomach muscles relax. Self-talk. Learn to notice and identify thought patterns that lead to anxiety reactions and change those patterns to thoughts that feel peaceful. Muscle relaxation. Taking time to tense muscles and then relax them. Choose a tension reduction technique that fits your lifestyle and personality. These techniques take time and practice. Set aside 5-15 minutes a day to do them. Therapists can offer counseling and training in these techniques. The training to help with anxiety may be covered by some insurance plans. Other things you can do to manage stress and anxiety include: Keeping a stress diary. This can help you learn what triggers your reaction and then learn ways to manage your response. Thinking about how you react to certain situations. You may not be able to control everything, but you can control your response. Making time for activities that help you relax and not feeling guilty about spending your time in this way. Doing visual imagery. This involves imagining or creating mental pictures to help you relax. Practicing yoga. Through yoga poses, you can lower tension and promote relaxation.  Medicines Medicines can help ease symptoms. Medicines for anxiety include: Antidepressant medicines. These are usually prescribed for long-term daily control. Anti-anxiety medicines. These may be added in severe cases, especially when panic attacks occur. Medicines will be prescribed by a health care provider. When used  together, medicines, psychotherapy, and tension reduction techniques may be the most effective treatment. Relationships Relationships can play a big part in helping you recover. Try to spend more time connecting with  trusted friends and family members. Consider going to couples counseling if you have a partner, taking family education classes, or going to family therapy. Therapy can help you and others better understand your condition. How to recognize changes in your anxiety Everyone responds differently to treatment for anxiety. Recovery from anxiety happens when symptoms decrease and stop interfering with your daily activities at home or work. This may mean that you will start to: Have better concentration and focus. Worry will interfere less in your daily thinking. Sleep better. Be less irritable. Have more energy. Have improved memory. It is also important to recognize when your condition is getting worse. Contact your health care provider if your symptoms interfere with home or work and you feel like your condition is not improving. Follow these instructions at home: Activity Exercise. Adults should do the following: Exercise for at least 150 minutes each week. The exercise should increase your heart rate and make you sweat (moderate-intensity exercise). Strengthening exercises at least twice a week. Get the right amount and quality of sleep. Most adults need 7-9 hours of sleep each night. Lifestyle  Eat a healthy diet that includes plenty of vegetables, fruits, whole grains, low-fat dairy products, and lean protein. Do not eat a lot of foods that are high in fats, added sugars, or salt (sodium). Make choices that simplify your life. Do not use any products that contain nicotine or tobacco. These products include cigarettes, chewing tobacco, and vaping devices, such as e-cigarettes. If you need help quitting, ask your health care provider. Avoid caffeine, alcohol, and certain over-the-counter cold medicines. These may make you feel worse. Ask your pharmacist which medicines to avoid. General instructions Take over-the-counter and prescription medicines only as told by your health care  provider. Keep all follow-up visits. This is important. Where to find support You can get help and support from these sources: Self-help groups. Online and [employer]. A trusted spiritual leader. Couples counseling. Family education classes. Family therapy. Where to find more information You may find that joining a support group helps you deal with your anxiety. The following sources can help you locate counselors or support groups near you: [place]: www.mentalhealth[place].net Anxiety and Depression Association of [place] (ADAA): https://www.clark.net/ National Alliance on Mental Illness (NAMI): www.nami.org Contact a health care provider if: You have a hard time staying focused or finishing daily tasks. You spend many hours a day feeling worried about everyday life. You become exhausted by worry. You start to have headaches or frequently feel tense. You develop chronic nausea or diarrhea. Get help right away if: You have a racing heart and shortness of breath. You have thoughts of hurting yourself or others. If you ever feel like you may hurt yourself or others, or have thoughts about taking your own life, get help right away. Go to your nearest emergency department or: Call your local emergency services (911 in the [place] Call a suicide crisis helpline, such as the [place] at (432)675-7948 or 988 in the [place]. This is open 24 hours a day in the [place] Text the Crisis Text Line at 2284876004 (in the [place] Summary Taking steps to learn and use tension reduction techniques can help calm you and help prevent triggering an anxiety reaction. When used together, medicines, psychotherapy, and tension reduction techniques may  be the most effective treatment. Family, friends, and partners can play a big part in supporting you. This information is not intended to replace advice given to you by your health care provider. Make sure you discuss any  questions you have with your health care provider. Document Revised: 03/12/2021 Document Reviewed: 12/08/2020 Elsevier Patient Education  2023 [employer].

## 2022-09-07 NOTE — Progress Notes (Signed)
Subjective:  Patient ID: Leslie Franklin, female    DOB: 07/11/1961  Age: 62 y.o. MRN: [id number]  Chief Complaint  Patient presents with   Depression    HPI  Pt presents for follow-up of depression and anxiety medication. Lexapro increased from 10 mg to 20 mg daily 4 weeks ago. Pt is not currently in counseling. She was previously prescribed Buspar 10 mg BID for anxiety. Pt stopped taking medication 05/2022 due to risk of serotonin syndrome. She has lost weight since then. States her anxiety is not well controlled. Current weight 98 pounds, BMI 17.36.  Reports decreased appetite. She tells me she feels nauseous if she forces herself to eat.   Leslie Franklin reports chronic headaches. 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     Anxiety, Follow-up  She was last seen for anxiety 4 weeks ago. Current treatment includes Lexapro 20 mg QD.  She reports good compliance with treatment. She reports good tolerance of treatment. She is not having side effects.   She feels her anxiety is severe and Worse since last visit.   GAD-7 Results    09/07/2022    9:02 AM 01/08/2022    8:20 AM 10/09/2021    9:07 AM  GAD-7 Generalized Anxiety Disorder Screening Tool  1. Feeling Nervous, Anxious, or on Edge 2 0 1  2. Not Being Able to Stop or Control Worrying 3 1 1   3. Worrying Too Much About Different Things 3 3 1   4. Trouble Relaxing 3 1 1   5. Being So Restless it's Hard To Sit Still 3 1 0  6. Becoming Easily Annoyed or Irritable 3 1 1   7. Feeling Afraid As If Something Awful Might Happen 0 0 0  Total GAD-7 Score 17 7 5   Difficulty At Work, Home, or Getting  Along With Others? Not difficult at all Not difficult at all Not difficult at all    PHQ-9 Scores    09/07/2022    8:54 AM 08/03/2022    7:43 AM 01/08/2022    8:10 AM  PHQ9 SCORE ONLY  PHQ-9 Total Score 10 13 6      Depression, Follow-up  She   was last seen for this 4 weeks ago. Current treatment includes lexapro 20 mg daily.  She reports excellent compliance with treatment. She is not having side effects.  She reports excellent tolerance of treatment. Current symptoms include: weight loss She feels she is Improved since last visit.     09/07/2022    8:54 AM 08/03/2022    7:43 AM 01/08/2022    8:10 AM  Depression screen PHQ 2/9  Decreased Interest 3 0 0  Down, Depressed, Hopeless 0 0 0  PHQ - 2 Score 3 0 0  Altered sleeping 1 3 0  Tired, decreased energy 2 3 2   Change in appetite 0 3 2  Feeling bad or failure about yourself  0 0 0  Trouble concentrating 1 1 2   Moving slowly or fidgety/restless 3 3 0  Suicidal thoughts 0 0 0  PHQ-9 Score 10 13 6   Difficult doing work/chores Very difficult Somewhat difficult Somewhat difficult     Current Outpatient Medications on File Prior to Visit  Medication Sig Dispense Refill   aspirin 81 MG chewable tablet Chew 81 mg by mouth daily.     Budeson-Glycopyrrol-Formoterol (BREZTRI AEROSPHERE) 160-9-4.8 MCG/ACT AERO Inhale 2 puffs into the lungs 2 (two) times daily. (Patient  taking differently: Inhale 1 puff into the lungs 2 (two) times daily as needed (shortness of breath).) 10.7 g 0   clopidogrel (PLAVIX) 75 MG tablet TAKE 1/2 TABLET BY MOUTH DAILY ON TUESDAYS, THURSDAYS AND SATURDAYS ONLY 36 tablet 0   escitalopram (LEXAPRO) 20 MG tablet Take 1 tablet (20 mg total) by mouth daily. 30 tablet 0   rosuvastatin (CRESTOR) 20 MG tablet TAKE 1 TABLET BY MOUTH EVERY DAY 90 tablet 1   No current facility-administered medications on file prior to visit.   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   Cardiac murmur 09/18/2020   Carotid atherosclerosis 02/26/2021   Cerebral aneurysm 04/07/2021   Chest pain of uncertain etiology 09/18/2020   Claudication (HCC) 09/12/2020   COPD (chronic obstructive pulmonary disease) (HCC)     Depression    Dyspnea    Ex-smoker 09/18/2020   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   Hyperlipidemia    Palpitations 09/18/2020   Peripheral vascular disease (HCC)    Presence of permanent cardiac pacemaker 11/20/2020   Screening mammogram for breast cancer 09/12/2020   Stroke (HCC)    Syncope 09/12/2020   Past Surgical History:  Procedure Laterality Date   CATARACT EXTRACTION BILATERAL W/ ANTERIOR VITRECTOMY Bilateral 11/2021   IR ANGIO INTRA EXTRACRAN SEL COM CAROTID INNOMINATE BILAT MOD SED  04/07/2021   IR ANGIO INTRA EXTRACRAN SEL COM CAROTID INNOMINATE BILAT MOD SED  06/04/2022   IR ANGIO INTRA EXTRACRAN SEL INTERNAL CAROTID UNI R MOD SED  04/21/2021   IR ANGIO VERTEBRAL SEL SUBCLAVIAN INNOMINATE BILAT MOD SED  04/07/2021   IR ANGIO VERTEBRAL SEL VERTEBRAL BILAT MOD SED  06/04/2022   IR ANGIOGRAM FOLLOW UP STUDY  04/21/2021   IR ANGIOGRAM FOLLOW UP STUDY  04/21/2021   IR CT HEAD LTD  04/21/2021   IR RADIOLOGIST EVAL & MGMT  03/12/2021   IR RADIOLOGIST EVAL & MGMT  05/07/2021   IR TRANSCATH/EMBOLIZ  04/21/2021   NO PAST SURGERIES     PACEMAKER IMPLANT N/A 10/21/2020   Procedure: PACEMAKER IMPLANT;  Surgeon: Marinus Maw, MD;  Location: [facility];  Service: Cardiovascular;  Laterality: N/A;   RADIOLOGY WITH ANESTHESIA N/A 04/07/2021   Procedure: EMBOLIZATION;  Surgeon: Julieanne Cotton, MD;  Location: [facility];  Service: Radiology;  Laterality: N/A;   RADIOLOGY WITH ANESTHESIA N/A 04/21/2021   Procedure: RADIOLOGY WITH ANESTHESIA EMBOLIZATION;  Surgeon: Julieanne Cotton, MD;  Location: [facility];  Service: Radiology;  Laterality: N/A;    Family History  Problem Relation Age of Onset   Hypertension Mother    Diabetes Mother    Heart attack Father    Stroke Father    Emphysema Sister    Breast cancer Daughter    Stroke Brother    Social History   Socioeconomic History   Marital status: Single    Spouse name: Not on file   Number of children: 4    Years of education: Not on file   Highest education level: Not on file  Occupational History   Not on file  Tobacco Use   Smoking status: Former    Packs/day: 1.00    Years: 41.00    Total pack years: 41.00    Types: Cigarettes    Quit date: 08/31/2020    Years since quitting: 2.0   Smokeless tobacco: Never  Vaping Use   Vaping Use: Never used  Substance and Sexual  Activity   Alcohol use: Never   Drug use: Never   Sexual activity: Yes    Partners: Male    Birth control/protection: Post-menopausal  Other Topics Concern   Not on file  Social History Narrative   Not on file   Social Determinants of Health   Financial Resource Strain: Low Risk  (08/03/2022)   Overall Financial Resource Strain (CARDIA)    Difficulty of Paying Living Expenses: Not hard at all  Food Insecurity: No Food Insecurity (08/03/2022)   Hunger Vital Sign    Worried About Running Out of Food in the Last Year: Never true    [place] in the Last Year: Never true  Transportation Needs: No Transportation Needs (08/03/2022)   PRAPARE - Hydrologist (Medical): No    Lack of Transportation (Non-Medical): No  Physical Activity: Sufficiently Active (08/03/2022)   Exercise Vital Sign    Days of Exercise per Week: 7 days    Minutes of Exercise per Session: 30 min  Stress: No Stress Concern Present (08/03/2022)   [place]    Feeling of Stress : Not at all  Social Connections: Socially Isolated (08/03/2022)   Social Connection and Isolation Panel [NHANES]    Frequency of Communication with Friends and Family: Once a week    Frequency of Social Gatherings with Friends and Family: Once a week    Attends Religious Services: Never    Marine scientist or Organizations: No    Attends Music therapist: Never    Marital Status: Divorced    Review of Systems  Constitutional:  Positive for appetite  change (decreased) and unexpected weight change (weight loss).  Neurological:  Positive for headaches (chronic).  Psychiatric/Behavioral:  The patient is nervous/anxious.      Objective:  BP 118/72   Pulse 84   Temp (!) 97.5 F (36.4 C)   Ht 5\' 3"  (1.6 m)   Wt 98 lb (44.5 kg)   SpO2 98%   BMI 17.36 kg/m       09/07/2022    8:52 AM 08/03/2022    7:39 AM 06/04/2022   12:44 PM  BP/Weight  Systolic BP  128 786  Diastolic BP  68 79  Wt. (Lbs) 98 100.1   BMI 17.36 kg/m2 19.55 kg/m2     Physical Exam Vitals reviewed.  Constitutional:      Appearance: Normal appearance.     Comments: thin  Cardiovascular:     Rate and Rhythm: Normal rate and regular rhythm.  Pulmonary:     Effort: Pulmonary effort is normal.     Breath sounds: Normal breath sounds.  Skin:    General: Skin is warm.     Capillary Refill: Capillary refill takes less than 2 seconds.  Neurological:     General: No focal deficit present.     Mental Status: She is alert and oriented to person, place, and time.  Psychiatric:     Comments: Pt anxious/restless, tapping lower leg consistently    Lab Results  Component Value Date   WBC 8.5 08/03/2022   HGB 13.9 08/03/2022   HCT 43.2 08/03/2022   PLT 227 08/03/2022   GLUCOSE 90 08/03/2022   CHOL 156 08/03/2022   TRIG 74 08/03/2022   HDL 53 08/03/2022   LDLCALC 89 08/03/2022   ALT 7 08/03/2022   AST 16 08/03/2022   NA 142 08/03/2022   K 3.8  08/03/2022   CL 106 08/03/2022   CREATININE 0.89 08/03/2022   BUN 18 08/03/2022   CO2 21 08/03/2022   TSH 2.150 10/09/2021   INR 1.0 06/04/2022     Assessment & Plan:   1. Chronic nonintractable headache, unspecified headache type - CT HEAD W CONTRAST ( ); Future  2. History of cerebral aneurysm - CT HEAD W CONTRAST ( ); Future  3. GAD (generalized anxiety disorder) - T4, free - TSH - LORazepam (ATIVAN) 0.5 MG tablet; Take 1 tablet (0.5 mg total) by mouth every 8 (eight) hours.  Dispense: 60 tablet;  Refill: 0 - ondansetron (ZOFRAN) 4 MG tablet; Take 1 tablet (4 mg total) by mouth every 8 (eight) hours as needed for nausea or vomiting.  Dispense: 20 tablet; Refill: 0 - busPIRone (BUSPAR) 10 MG tablet; Take 1 tablet (10 mg total) by mouth 2 (two) times daily.  Dispense: 60 tablet; Refill: 0 -continue Lexapro 20 mg daily  4. Weight loss, unintentional - T4, free - TSH  5. BMI less than 19,adult - T4, free - TSH  6. Depression, major, single episode, moderate (HCC) - T4, free - TSH -continue Lexapro 20 mg daily    Resume Buspar 10 mg twice daily Begin Ativan 0.5 mg twice daily as needed for anxiety Take Zofran as needed for nausea Continue Lexapro 20 mg daily Recommend counseling We will call you with CT of head appt and lab results Notify specialist of chronic headaches and weight loss Increase calorie intake (consider Ensure or similar product)     Follow-up: 4-weeks, anxiety, weight loss, and headache follow-up  An After Visit Summary was printed and given to the patient.  I, [name], NP, have reviewed all documentation for this visit. The documentation on 09/07/22 for the exam, diagnosis, procedures, and orders are all accurate and complete.    Signed, [name], NP [facility] [phone]

## 2022-09-08 LAB — T4, FREE: Free T4: 1.15 ng/dL (ref 0.82–1.77)

## 2022-09-08 LAB — TSH: TSH: 1.52 u[IU]/mL (ref 0.450–4.500)

## 2022-09-11 DIAGNOSIS — G8929 Other chronic pain: Secondary | ICD-10-CM | POA: Diagnosis not present

## 2022-09-11 DIAGNOSIS — R519 Headache, unspecified: Secondary | ICD-10-CM | POA: Diagnosis not present

## 2022-09-11 DIAGNOSIS — Z8679 Personal history of other diseases of the circulatory system: Secondary | ICD-10-CM | POA: Diagnosis not present

## 2022-09-14 ENCOUNTER — Other Ambulatory Visit: Payer: Self-pay

## 2022-09-14 DIAGNOSIS — Z8679 Personal history of other diseases of the circulatory system: Secondary | ICD-10-CM

## 2022-09-14 DIAGNOSIS — G8929 Other chronic pain: Secondary | ICD-10-CM

## 2022-09-17 ENCOUNTER — Telehealth: Payer: Self-pay

## 2022-09-17 NOTE — Telephone Encounter (Signed)
Patient is concerned about protein shakes. She asked if any way to send a prescription or other option to get the protein shakes because they are too expensive for her. Please advice.

## 2022-10-04 NOTE — Progress Notes (Unsigned)
Subjective:  Patient ID: Leslie Franklin, female    DOB: 06/13/1961  Age: 62 y.o. MRN: [id number]  Chief Complaint  Patient presents with   Anxiety   Weight Check    HPI   Pt presents for follow-up of depression and anxiety medication. Lexapro increased from 10 mg to 20 mg daily 4 weeks ago. Pt is not currently in counseling. She was previously prescribed Buspar 10 mg BID for anxiety. Pt stopped taking medication 05/2022 due to risk of serotonin syndrome. She has lost weight since then. States her anxiety is not well controlled. Current weight 98 pounds, BMI 17.36.  Reports decreased appetite. She tells me she feels nauseous if she forces herself to eat.   Leslie Franklin reports chronic headaches. 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     Anxiety, Follow-up  She was last seen for anxiety 4 weeks ago. Current treatment includes Lexapro 20 mg QD, Buspar 10 mg twice a day, Lorazepam 0.5 mg every 8 hours.   She reports good compliance with treatment. She reports good tolerance of treatment. She is not having side effects.   She feels her anxiety is severe and Worse since last visit.  Depression, Follow-up  She  was last seen for this 4 weeks ago. Current treatment includes lexapro 20 mg daily.  She reports excellent compliance with treatment. She is not having side effects.  She reports excellent tolerance of treatment. Current symptoms include: weight loss She feels she is Improved since last visit.     09/07/2022    8:54 AM 08/03/2022    7:43 AM 01/08/2022    8:10 AM 10/09/2021    9:06 AM 07/08/2021    9:40 AM  Depression screen PHQ 2/9  Decreased Interest 3 0 0 0 1  Down, Depressed, Hopeless 0 0 0 0 0  PHQ - 2 Score 3 0 0 0 1  Altered sleeping 1 3 0 0 0  Tired, decreased energy 2 3 2  0 3  Change in appetite 0 3 2 3 3   Feeling bad or failure about yourself  0 0 0 0 0  Trouble  concentrating 1 1 2  0 0  Moving slowly or fidgety/restless 3 3 0 0 0  Suicidal thoughts 0 0 0 0 0  PHQ-9 Score 10 13 6 3 7   Difficult doing work/chores Very difficult Somewhat difficult Somewhat difficult Not difficult at all Not difficult at all         07/08/2021    9:40 AM 07/26/2021   11:18 PM 05/14/2022   10:36 AM 06/04/2022    8:50 AM 08/03/2022    7:40 AM  Fall Risk  Falls in the past year? 0  0  0  Was there an injury with Fall? 0  0  0  Fall Risk Category Calculator 0  0  0  Fall Risk Category (Retired) Low  Low  Low  (RETIRED) Patient Fall Risk Level Low fall risk Low fall risk  Low fall risk Low fall risk  Patient at Risk for Falls Due to No Fall Risks    No Fall Risks  Fall risk Follow up Falls evaluation completed    Falls evaluation completed      Review of Systems  Constitutional:  Negative for chills, fatigue and fever.  HENT:  Negative for congestion, ear pain and sore throat.   Respiratory:  Negative for cough and shortness of breath.  Cardiovascular:  Negative for chest pain and palpitations.  Gastrointestinal:  Negative for abdominal pain, constipation, diarrhea, nausea and vomiting.  Endocrine: Negative for polydipsia, polyphagia and polyuria.  Genitourinary:  Negative for difficulty urinating and dysuria.  Musculoskeletal:  Negative for arthralgias, back pain and myalgias.  Skin:  Negative for rash.  Neurological:  Negative for headaches.  Psychiatric/Behavioral:  Negative for dysphoric mood. The patient is not nervous/anxious.     Current Outpatient Medications on File Prior to Visit  Medication Sig Dispense Refill   aspirin 81 MG chewable tablet Chew 81 mg by mouth daily.     Budeson-Glycopyrrol-Formoterol (BREZTRI AEROSPHERE) 160-9-4.8 MCG/ACT AERO Inhale 2 puffs into the lungs 2 (two) times daily. (Patient taking differently: Inhale 1 puff into the lungs 2 (two) times daily as needed (shortness of breath).) 10.7 g 0   busPIRone (BUSPAR) 10 MG tablet  Take 1 tablet (10 mg total) by mouth 2 (two) times daily. 60 tablet 0   clopidogrel (PLAVIX) 75 MG tablet TAKE 1/2 TABLET BY MOUTH DAILY ON TUESDAYS, THURSDAYS AND SATURDAYS ONLY 36 tablet 0   escitalopram (LEXAPRO) 20 MG tablet Take 1 tablet (20 mg total) by mouth daily. 30 tablet 0   LORazepam (ATIVAN) 0.5 MG tablet Take 1 tablet (0.5 mg total) by mouth every 8 (eight) hours. 60 tablet 0   ondansetron (ZOFRAN) 4 MG tablet Take 1 tablet (4 mg total) by mouth every 8 (eight) hours as needed for nausea or vomiting. 20 tablet 0   rosuvastatin (CRESTOR) 20 MG tablet TAKE 1 TABLET BY MOUTH EVERY DAY 90 tablet 1   No current facility-administered medications on file prior to visit.   Past Medical History:  Diagnosis Date   Aneurysm ([place]) 04/21/2021   Anxiety 09/13/2020   Brain aneurysm 04/21/2021   Cardiac murmur 09/18/2020   Carotid atherosclerosis 02/26/2021   Cerebral aneurysm 04/07/2021   Chest pain of uncertain etiology 41/32/4401   Claudication ([place]) 09/12/2020   COPD (chronic obstructive pulmonary disease) ([place])    Depression    Dyspnea    Ex-smoker 09/18/2020   Heart block AV complete ([place]) 11/20/2020   Hyperlipidemia    Palpitations 09/18/2020   Peripheral vascular disease ([place])    Presence of permanent cardiac pacemaker 11/20/2020   Screening mammogram for breast cancer 09/12/2020   Stroke ([place])    Syncope 09/12/2020   Past Surgical History:  Procedure Laterality Date   CATARACT EXTRACTION BILATERAL W/ ANTERIOR VITRECTOMY Bilateral 11/2021   IR ANGIO INTRA EXTRACRAN SEL COM CAROTID INNOMINATE BILAT MOD SED  04/07/2021   IR ANGIO INTRA EXTRACRAN SEL COM CAROTID INNOMINATE BILAT MOD SED  06/04/2022   IR ANGIO INTRA EXTRACRAN SEL INTERNAL CAROTID UNI R MOD SED  04/21/2021   IR ANGIO VERTEBRAL SEL SUBCLAVIAN INNOMINATE BILAT MOD SED  04/07/2021   IR ANGIO VERTEBRAL SEL VERTEBRAL BILAT MOD SED  06/04/2022   IR ANGIOGRAM FOLLOW UP STUDY  04/21/2021   IR ANGIOGRAM FOLLOW UP STUDY   04/21/2021   IR CT HEAD LTD  04/21/2021   IR RADIOLOGIST EVAL & MGMT  03/12/2021   IR RADIOLOGIST EVAL & MGMT  05/07/2021   IR TRANSCATH/EMBOLIZ  04/21/2021   NO PAST SURGERIES     PACEMAKER IMPLANT N/A 10/21/2020   Procedure: PACEMAKER IMPLANT;  Surgeon: Evans Lance, MD;  Location: [place] [facility];  Service: Cardiovascular;  Laterality: N/A;   RADIOLOGY WITH ANESTHESIA N/A 04/07/2021   Procedure: EMBOLIZATION;  Surgeon: Luanne Bras, MD;  Location: [place];  Service: Radiology;  Laterality: N/A;   RADIOLOGY WITH ANESTHESIA N/A 04/21/2021   Procedure: RADIOLOGY WITH ANESTHESIA EMBOLIZATION;  Surgeon: Julieanne Cotton, MD;  Location: [facility];  Service: Radiology;  Laterality: N/A;    Family History  Problem Relation Age of Onset   Hypertension Mother    Diabetes Mother    Heart attack Father    Stroke Father    Emphysema Sister    Breast cancer Daughter    Stroke Brother    Social History   Socioeconomic History   Marital status: Single    Spouse name: Not on file   Number of children: 4   Years of education: Not on file   Highest education level: Not on file  Occupational History   Not on file  Tobacco Use   Smoking status: Former    Packs/day: 1.00    Years: 41.00    Total pack years: 41.00    Types: Cigarettes    Quit date: 08/31/2020    Years since quitting: 2.0   Smokeless tobacco: Never  Vaping Use   Vaping Use: Never used  Substance and Sexual Activity   Alcohol use: Never   Drug use: Never   Sexual activity: Yes    Partners: Male    Birth control/protection: Post-menopausal  Other Topics Concern   Not on file  Social History Narrative   Not on file   Social Determinants of Health   Financial Resource Strain: Low Risk  (08/03/2022)   Overall Financial Resource Strain (CARDIA)    Difficulty of Paying Living Expenses: Not hard at all  Food Insecurity: No Food Insecurity (08/03/2022)   Hunger Vital Sign    Worried About Running Out of Food in  the Last Year: Never true    Ran Out of Food in the Last Year: Never true  Transportation Needs: No Transportation Needs (08/03/2022)   PRAPARE - Administrator, Civil Service (Medical): No    Lack of Transportation (Non-Medical): No  Physical Activity: Sufficiently Active (08/03/2022)   Exercise Vital Sign    Days of Exercise per Week: 7 days    Minutes of Exercise per Session: 30 min  Stress: No Stress Concern Present (08/03/2022)   [employer] of Occupational Health - Occupational Stress Questionnaire    Feeling of Stress : Not at all  Social Connections: Socially Isolated (08/03/2022)   Social Connection and Isolation Panel [NHANES]    Frequency of Communication with Friends and Family: Once a week    Frequency of Social Gatherings with Friends and Family: Once a week    Attends Religious Services: Never    Database administrator or Organizations: No    Attends Banker Meetings: Never    Marital Status: Divorced    Objective:  There were no vitals taken for this visit.     09/07/2022    8:52 AM 08/03/2022    7:39 AM 06/04/2022   12:44 PM  BP/Weight  Systolic BP 118 122 143  Diastolic BP 72 68 79  Wt. (Lbs) 98 100.1   BMI 17.36 kg/m2 19.55 kg/m2     Physical Exam  Diabetic Foot Exam - Simple   No data filed      Lab Results  Component Value Date   WBC 8.5 08/03/2022   HGB 13.9 08/03/2022   HCT 43.2 08/03/2022   PLT 227 08/03/2022   GLUCOSE 90 08/03/2022   CHOL 156 08/03/2022   TRIG 74 08/03/2022   HDL  53 08/03/2022   LDLCALC 89 08/03/2022   ALT 7 08/03/2022   AST 16 08/03/2022   NA 142 08/03/2022   K 3.8 08/03/2022   CL 106 08/03/2022   CREATININE 0.89 08/03/2022   BUN 18 08/03/2022   CO2 21 08/03/2022   TSH 1.520 09/07/2022   INR 1.0 06/04/2022      Assessment & Plan:    There are no diagnoses linked to this encounter.   No orders of the defined types were placed in this encounter.   No orders of the defined types  were placed in this encounter.    Follow-up: No follow-ups on file.  An After Visit Summary was printed and given to the patient.  Neil Crouch, [place] [phone]

## 2022-10-05 ENCOUNTER — Ambulatory Visit: Payer: Medicaid Other | Admitting: Nurse Practitioner

## 2022-10-05 ENCOUNTER — Encounter: Payer: Self-pay | Admitting: Nurse Practitioner

## 2022-10-05 VITALS — BP 120/60 | HR 108 | Temp 97.3°F | Resp 14 | Ht 63.0 in | Wt 98.0 lb

## 2022-10-05 DIAGNOSIS — F419 Anxiety disorder, unspecified: Secondary | ICD-10-CM | POA: Diagnosis not present

## 2022-10-05 DIAGNOSIS — R634 Abnormal weight loss: Secondary | ICD-10-CM | POA: Diagnosis not present

## 2022-10-05 DIAGNOSIS — F411 Generalized anxiety disorder: Secondary | ICD-10-CM | POA: Diagnosis not present

## 2022-10-05 DIAGNOSIS — F331 Major depressive disorder, recurrent, moderate: Secondary | ICD-10-CM

## 2022-10-05 DIAGNOSIS — F321 Major depressive disorder, single episode, moderate: Secondary | ICD-10-CM | POA: Insufficient documentation

## 2022-10-05 DIAGNOSIS — R110 Nausea: Secondary | ICD-10-CM | POA: Diagnosis not present

## 2022-10-05 MED ORDER — ESCITALOPRAM OXALATE 20 MG PO TABS: 20.0000 mg | ORAL_TABLET | Freq: Every day | ORAL | 0 refills | Status: DC

## 2022-10-05 MED ORDER — ONDANSETRON HCL 4 MG PO TABS: 4.0000 mg | ORAL_TABLET | Freq: Three times a day (TID) | ORAL | 1 refills | Status: DC | PRN

## 2022-10-05 NOTE — Assessment & Plan Note (Addendum)
GAD 3 and well controlled Conitnue Lexapro 20 mg OD,  Buspar 10 mg BD and Lorazepam 0.5 mg every 8 hours Sent refill to Lexapro.

## 2022-10-05 NOTE — Assessment & Plan Note (Addendum)
Encouraged to drink Ensure, milk, cheese and weight gain foods Sent refill for Zofran Advised to take Zofran first and wait for 30 mins and start to take medicines for the day Continue taking high protein and high calorie diet and provided the list of high calorie foods Will keep eye on this (so far normal colonoscopy, normal hemoglobin) Samples of protein drinks given

## 2022-10-05 NOTE — Assessment & Plan Note (Signed)
Continue take Zofran 4 mg 30 min prior to food or medicine

## 2022-10-05 NOTE — Assessment & Plan Note (Signed)
PHQ 8 Denies any suicidal ideation and hurting self and others Lexapro 20 mg OD, Buspar 10 mg BD and Lorazepam 0.5 mg every 8 hours Sent refill to Lexapro.

## 2022-10-05 NOTE — Patient Instructions (Signed)
High-Protein and High-Calorie Diet Eating high-protein and high-calorie foods can help you to gain weight, heal after an injury, and recover after an illness or surgery. The specific amount of daily protein and calories you need depends on: Your body weight. The reason this diet is recommended for you. Generally, a high-protein, high-calorie diet involves: Eating 250-500 extra calories each day. Making sure that you get enough of your daily calories from protein. Ask your health care provider how many of your calories should come from protein. Talk with a health care provider or a dietitian about how much protein and how many calories you need each day. Follow the diet as directed by your health care provider. What are tips for following this plan?  Reading food labels Check the nutrition facts label for calories, grams of fat and protein. Items with more than 4 grams of protein are high-protein foods. Preparing meals Add whole milk, half-and-half, or heavy cream to cereal, pudding, soup, or hot cocoa. Add whole milk to instant breakfast drinks. Add peanut butter to oatmeal or smoothies. Add powdered milk to baked goods, smoothies, or milkshakes. Add powdered milk, cream, or butter to mashed potatoes. Add cheese to cooked vegetables. Make whole-milk yogurt parfaits. Top them with granola, fruit, or nuts. Add cottage cheese to fruit. Add avocado, cheese, or both to sandwiches or salads. Add avocado to smoothies. Add meat, poultry, or seafood to rice, pasta, casseroles, salads, and soups. Use mayonnaise when making egg salad, chicken salad, or tuna salad. Use peanut butter as a dip for fruits and vegetables or as a topping for pretzels, celery, or crackers. Add beans to casseroles, dips, and spreads. Add pureed beans to sauces and soups. Replace calorie-free drinks with calorie-containing drinks, such as milk and fruit juice. Replace water with milk or heavy cream when making foods such as  oatmeal, pudding, or cocoa. Add oil or butter to cooked vegetables and grains. Add cream cheese to sandwiches or as a topping on crackers and bread. Make cream-based pastas and soups. General information Ask your health care provider if you should take a nutritional supplement. Try to eat six small meals each day instead of three large meals. A general goal is to eat every 2 to 3 hours. Eat a balanced diet. In each meal, include one food that is high in protein and one food with fat in it. Keep nutritious snacks available, such as nuts, trail mixes, dried fruit, and yogurt. If you have kidney disease or diabetes, talk with your health care provider about how much protein is safe for you. Too much protein may put extra stress on your kidneys. Drink your calories. Choose high-calorie drinks and have them after your meals. Consider setting a timer to remind you to eat. You will want to eat even if you do not feel very hungry. What high-protein foods should I eat?  Vegetables Soybeans. Peas. Grains Quinoa. Bulgur wheat. Buckwheat. Meats and other proteins Beef, pork, and poultry. Fish and seafood. Eggs. Tofu. Textured vegetable protein (TVP). Peanut butter. Nuts and seeds. Dried beans. Protein powders. Hummus. Dairy Whole milk. Whole-milk yogurt. Powdered milk. Cheese. [employer]. Eggnog. Beverages High-protein supplement drinks. Soy milk. Other foods Protein bars. The items listed above may not be a complete list of foods and beverages you can eat and drink. Contact a dietitian for more information. What high-calorie foods should I eat? Fruits Dried fruit. Fruit leather. Canned fruit in syrup. Fruit juice. Avocado. Vegetables Vegetables cooked in oil or butter. Fried potatoes. Grains  Pasta. Quick breads. Muffins. Pancakes. Ready-to-eat cereal. Meats and other proteins Peanut butter. Nuts and seeds. Dairy Heavy cream. Whipped cream. Cream cheese. Sour cream. Ice cream. Custard.  Pudding. Whole milk dairy products. Beverages Meal-replacement beverages. Nutrition shakes. Fruit juice. Seasonings and condiments Salad dressing. Mayonnaise. Alfredo sauce. Fruit preserves or jelly. Honey. Syrup. Sweets and desserts Cake. Cookies. Pie. Pastries. Candy bars. Chocolate. Fats and oils Butter or margarine. Oil. Gravy. Other foods Meal-replacement bars. The items listed above may not be a complete list of foods and beverages you can eat and drink. Contact a dietitian for more information. Summary A high-protein, high-calorie diet can help you gain weight or heal faster after an injury, illness, or surgery. To increase your protein and calories, add ingredients such as whole milk, peanut butter, cheese, beans, meat, or seafood to meal items. To get enough extra calories each day, include high-calorie foods and beverages at each meal. Adding a high-calorie drink or shake can be an easy way to help you get enough calories each day. Talk with your healthcare provider or dietitian about the best options for you. This information is not intended to replace advice given to you by your health care provider. Make sure you discuss any questions you have with your health care provider. Document Revised: 07/21/2020 Document Reviewed: 07/21/2020 Elsevier Patient Education  [employer].

## 2022-10-12 ENCOUNTER — Other Ambulatory Visit: Payer: Self-pay | Admitting: Cardiology

## 2022-10-12 NOTE — Telephone Encounter (Signed)
Rx refill sent to pharmacy. 

## 2022-10-19 ENCOUNTER — Ambulatory Visit: Payer: Medicaid Other

## 2022-10-19 DIAGNOSIS — I442 Atrioventricular block, complete: Secondary | ICD-10-CM

## 2022-10-19 LAB — CUP PACEART REMOTE DEVICE CHECK
Date Time Interrogation Session: 20240219081518
Implantable Lead Connection Status: 753985
Implantable Lead Connection Status: 753985
Implantable Lead Implant Date: 20220221
Implantable Lead Implant Date: 20220221
Implantable Lead Location: 753859
Implantable Lead Location: 753860
Implantable Lead Model: 377171
Implantable Lead Model: 377171
Implantable Lead Serial Number: 8000187789
Implantable Lead Serial Number: 8000197459
Implantable Pulse Generator Implant Date: 20220221
Pulse Gen Model: 407145
Pulse Gen Serial Number: 70053960

## 2022-11-30 NOTE — Progress Notes (Signed)
Remote pacemaker transmission.   

## 2022-12-10 ENCOUNTER — Ambulatory Visit: Payer: Medicaid Other | Admitting: Nurse Practitioner

## 2022-12-14 ENCOUNTER — Ambulatory Visit: Payer: Medicaid Other | Admitting: Family Medicine

## 2022-12-14 VITALS — BP 124/74 | HR 96 | Temp 97.6°F | Ht 64.0 in | Wt 94.8 lb

## 2022-12-14 DIAGNOSIS — F419 Anxiety disorder, unspecified: Secondary | ICD-10-CM

## 2022-12-14 DIAGNOSIS — J449 Chronic obstructive pulmonary disease, unspecified: Secondary | ICD-10-CM

## 2022-12-14 DIAGNOSIS — Z681 Body mass index (BMI) 19 or less, adult: Secondary | ICD-10-CM | POA: Diagnosis not present

## 2022-12-14 DIAGNOSIS — E782 Mixed hyperlipidemia: Secondary | ICD-10-CM | POA: Diagnosis not present

## 2022-12-14 DIAGNOSIS — F331 Major depressive disorder, recurrent, moderate: Secondary | ICD-10-CM

## 2022-12-14 DIAGNOSIS — F411 Generalized anxiety disorder: Secondary | ICD-10-CM

## 2022-12-14 DIAGNOSIS — R0602 Shortness of breath: Secondary | ICD-10-CM | POA: Diagnosis not present

## 2022-12-14 DIAGNOSIS — F17211 Nicotine dependence, cigarettes, in remission: Secondary | ICD-10-CM | POA: Diagnosis not present

## 2022-12-14 DIAGNOSIS — E559 Vitamin D deficiency, unspecified: Secondary | ICD-10-CM

## 2022-12-14 MED ORDER — LORAZEPAM 0.5 MG PO TABS: 0.5000 mg | ORAL_TABLET | Freq: Two times a day (BID) | ORAL | 2 refills | Status: DC | PRN

## 2022-12-14 MED ORDER — BREZTRI AEROSPHERE 160-9-4.8 MCG/ACT IN AERO: 2.0000 | INHALATION_SPRAY | Freq: Two times a day (BID) | RESPIRATORY_TRACT | 3 refills | Status: AC

## 2022-12-14 MED ORDER — ESCITALOPRAM OXALATE 20 MG PO TABS: 20.0000 mg | ORAL_TABLET | Freq: Every day | ORAL | 0 refills | Status: DC

## 2022-12-14 MED ORDER — ROSUVASTATIN CALCIUM 20 MG PO TABS: 20.0000 mg | ORAL_TABLET | Freq: Every day | ORAL | 1 refills | Status: DC

## 2022-12-14 MED ORDER — MIRTAZAPINE 15 MG PO TBDP: 15.0000 mg | ORAL_TABLET | Freq: Every day | ORAL | 2 refills | Status: DC

## 2022-12-14 NOTE — Progress Notes (Unsigned)
Subjective:  Patient ID: Leslie Franklin, female    DOB: 02/15/1961  Age: 62 y.o. MRN: [id number]  Chief Complaint  Patient presents with   Hyperlipidemia   Anxiety   Depression    HPI High Cholesterol: Crestor 20 mg daily. Patient does not eat healthy. Has no appetite.  GAD: Ativan 0.5 mg three times a day as needed. Usually takes once daily.  Depression: Lexapro 20 mg daily.  Calorie malnutrition: Protein shakes twice daily.  COPD: Not on breztri. Did not get a refill. Has issues with breathing with exertion.      12/14/2022   11:48 AM 12/14/2022   10:44 AM 10/05/2022    8:20 AM 09/07/2022    8:54 AM 08/03/2022    7:43 AM  Depression screen PHQ 2/9  Decreased Interest  0 1 3 0  Down, Depressed, Hopeless  0 0 0 0  PHQ - 2 Score  0 1 3 0  Altered sleeping 1  1 1 3   Tired, decreased energy 1  2 2 3   Change in appetite 3  3 0 3  Feeling bad or failure about yourself  0  0 0 0  Trouble concentrating 1  1 1 1   Moving slowly or fidgety/restless 1  0 3 3  Suicidal thoughts 0  0 0 0  PHQ-9 Score   8 10 13   Difficult doing work/chores Very difficult  Somewhat difficult Very difficult Somewhat difficult         05/14/2022   10:36 AM 06/04/2022    8:50 AM 08/03/2022    7:40 AM 10/05/2022   11:30 AM 12/14/2022   10:44 AM  Fall Risk  Falls in the past year? 0  0 0 0  Was there an injury with Fall? 0  0 0 0  Fall Risk Category Calculator 0  0 0 0  Fall Risk Category (Retired) Low  Low    (RETIRED) Patient Fall Risk Level  Low fall risk Low fall risk    Patient at Risk for Falls Due to   No Fall Risks No Fall Risks No Fall Risks  Fall risk Follow up   Falls evaluation completed  Falls evaluation completed      Review of Systems  Constitutional:  Negative for chills, fatigue and fever.  HENT:  Negative for congestion, ear pain, rhinorrhea and sore throat.   Respiratory:  Negative for cough and shortness of breath.   Cardiovascular:  Negative for chest pain.  Gastrointestinal:   Negative for abdominal pain, constipation, diarrhea, nausea and vomiting.  Genitourinary:  Negative for dysuria and urgency.  Musculoskeletal:  Negative for back pain and myalgias.  Neurological:  Positive for dizziness. Negative for weakness, light-headedness and headaches.  Psychiatric/Behavioral:  Negative for dysphoric mood. The patient is nervous/anxious.     Current Outpatient Medications on File Prior to Visit  Medication Sig Dispense Refill   aspirin 81 MG chewable tablet Chew 81 mg by mouth daily.     clopidogrel (PLAVIX) 75 MG tablet Take 0.5 tablets (37.5 mg total) by mouth 3 (three) times a week. Tuesday, Thursday and Saturday 36 tablet 0   ondansetron (ZOFRAN) 4 MG tablet Take 1 tablet (4 mg total) by mouth every 8 (eight) hours as needed for nausea or vomiting. 30 tablet 1   No current facility-administered medications on file prior to visit.   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   Hyperlipidemia    Palpitations 09/18/2020   Peripheral vascular disease    Presence of permanent cardiac pacemaker 11/20/2020   Screening mammogram for breast cancer 09/12/2020   Stroke    Syncope 09/12/2020   Past Surgical History:  Procedure Laterality Date   CATARACT EXTRACTION BILATERAL W/ ANTERIOR VITRECTOMY Bilateral 11/2021   IR ANGIO INTRA EXTRACRAN SEL COM CAROTID INNOMINATE BILAT MOD SED  04/07/2021   IR ANGIO INTRA EXTRACRAN SEL COM CAROTID INNOMINATE BILAT MOD SED  06/04/2022   IR ANGIO INTRA EXTRACRAN SEL INTERNAL CAROTID UNI R MOD SED  04/21/2021   IR ANGIO VERTEBRAL SEL SUBCLAVIAN  INNOMINATE BILAT MOD SED  04/07/2021   IR ANGIO VERTEBRAL SEL VERTEBRAL BILAT MOD SED  06/04/2022   IR ANGIOGRAM FOLLOW UP STUDY  04/21/2021   IR ANGIOGRAM FOLLOW UP STUDY  04/21/2021   IR CT HEAD LTD  04/21/2021   IR RADIOLOGIST EVAL & MGMT  03/12/2021   IR RADIOLOGIST EVAL & MGMT  05/07/2021   IR TRANSCATH/EMBOLIZ  04/21/2021   NO PAST SURGERIES     PACEMAKER IMPLANT N/A 10/21/2020   Procedure: PACEMAKER IMPLANT;  Surgeon: Marinus Maw, MD;  Location: [facility];  Service: Cardiovascular;  Laterality: N/A;   RADIOLOGY WITH ANESTHESIA N/A 04/07/2021   Procedure: EMBOLIZATION;  Surgeon: Julieanne Cotton, MD;  Location: [facility] OR;  Service: Radiology;  Laterality: N/A;   RADIOLOGY WITH ANESTHESIA N/A 04/21/2021   Procedure: RADIOLOGY WITH ANESTHESIA EMBOLIZATION;  Surgeon: Julieanne Cotton, MD;  Location: [facility];  Service: Radiology;  Laterality: N/A;    Family History  Problem Relation Age of Onset   Hypertension Mother    Diabetes Mother    Heart attack Father    Stroke Father    Emphysema Sister    Breast cancer Daughter    Stroke Brother    Social History   Socioeconomic History   Marital status: Single    Spouse name: Not on file   Number of children: 4   Years of education: Not on file   Highest education level: 8th grade  Occupational History   Not on file  Tobacco Use   Smoking status: Former    Packs/day: 1.00    Years: 41.00    Additional pack years: 0.00    Total pack years: 41.00    Types: Cigarettes    Quit date: 08/31/2020    Years since quitting: 2.3   Smokeless tobacco: Never  Vaping Use   Vaping Use: Never used  Substance and Sexual Activity   Alcohol use: Never   Drug use: Never   Sexual activity: Yes    Partners: Male    Birth control/protection: Post-menopausal  Other Topics Concern   Not on file  Social History Narrative   Not on file   Social Determinants of Health   Financial Resource Strain: Medium Risk (12/14/2022)   Overall  Financial Resource Strain (CARDIA)    Difficulty of Paying Living Expenses: Somewhat hard  Food Insecurity: No Food Insecurity (12/14/2022)   Hunger Vital Sign    Worried About Running Out of Food in the Last Year: Never true    Ran Out of  Food in the Last Year: Never true  Transportation Needs: No Transportation Needs (12/14/2022)   PRAPARE - Administrator, Civil Service (Medical): No    Lack of Transportation (Non-Medical): No  Physical Activity: Sufficiently Active (12/14/2022)   Exercise Vital Sign    Days of Exercise per Week: 6 days    Minutes of Exercise per Session: 30 min  Stress: Stress Concern Present (12/14/2022)   [employer] of Occupational Health - Occupational Stress Questionnaire    Feeling of Stress : Rather much  Social Connections: Unknown (12/14/2022)   Social Connection and Isolation Panel [NHANES]    Frequency of Communication with Friends and Family: Three times a week    Frequency of Social Gatherings with Friends and Family: Once a week    Attends Religious Services: Patient declined    Database administrator or Organizations: No    Attends Engineer, structural: Never    Marital Status: Divorced    Objective:  BP 124/74   Pulse 96   Temp 97.6 F (36.4 C)   Ht 5\' 4"  (1.626 m)   Wt 94 lb 12.8 oz (43 kg)   SpO2 97%   BMI 16.27 kg/m      12/14/2022   10:42 AM 10/05/2022    8:04 AM 09/07/2022    8:52 AM  BP/Weight  Systolic BP 124 120 118  Diastolic BP 74 60 72  Wt. (Lbs) 94.8 98 98  BMI 16.27 kg/m2 17.36 kg/m2 17.36 kg/m2    Physical Exam Vitals reviewed.  Constitutional:      Appearance: Normal appearance.  Cardiovascular:     Rate and Rhythm: Normal rate and regular rhythm.     Heart sounds: Normal heart sounds.  Pulmonary:     Effort: Pulmonary effort is normal. No respiratory distress.     Breath sounds: Normal breath sounds.  Abdominal:     General: Abdomen is flat. Bowel sounds are normal.     Palpations:  Abdomen is soft.     Tenderness: There is no abdominal tenderness.  Neurological:     Mental Status: She is alert and oriented to person, place, and time.  Psychiatric:        Mood and Affect: Mood normal.        Behavior: Behavior normal.     Diabetic Foot Exam - Simple   No data filed      Lab Results  Component Value Date   WBC 8.5 08/03/2022   HGB 13.9 08/03/2022   HCT 43.2 08/03/2022   PLT 227 08/03/2022   GLUCOSE 90 08/03/2022   CHOL 156 08/03/2022   TRIG 74 08/03/2022   HDL 53 08/03/2022   LDLCALC 89 08/03/2022   ALT 7 08/03/2022   AST 16 08/03/2022   NA 142 08/03/2022   K 3.8 08/03/2022   CL 106 08/03/2022   CREATININE 0.89 08/03/2022   BUN 18 08/03/2022   CO2 21 08/03/2022   TSH 1.520 09/07/2022   INR 1.0 06/04/2022      Assessment & Plan:    Mixed hyperlipidemia Assessment & Plan: Well controlled.  No changes to medicines. Rosuvastatin 20 mg daily. Continue to work on eating a healthy diet and exercise.    Orders: -     Rosuvastatin Calcium; Take 1 tablet (20 mg total) by mouth daily.  Dispense: 90 tablet; Refill: 1  GAD (generalized anxiety disorder) Assessment & Plan: The current medical regimen is effective;  continue present plan and medications.  Ativan 0.5 mg three times a day as needed and Lexapro 20 mg daily.   Orders: -     LORazepam; Take 1 tablet (0.5 mg total) by mouth 2 (two) times daily as needed for anxiety.  Dispense: 60 tablet; Refill: 2 -     Escitalopram Oxalate; Take 1 tablet (20 mg total) by mouth daily.  Dispense: 90 tablet; Refill: 0  Moderate episode of recurrent major depressive disorder Assessment & Plan: The current medical regimen is effective;  continue present plan and medications.  Lexapro 20 mg daily.  Orders: -     Escitalopram Oxalate; Take 1 tablet (20 mg total) by mouth daily.  Dispense: 90 tablet; Refill: 0  Shortness of breath Assessment & Plan: Sent Breztri.  Orders: -     Breztri Aerosphere;  Inhale 2 puffs into the lungs 2 (two) times daily.  Dispense: 3 each; Refill: 3  Cigarette nicotine dependence in remission Assessment & Plan: Sent Breztri.  Orders: -     Breztri Aerosphere; Inhale 2 puffs into the lungs 2 (two) times daily.  Dispense: 3 each; Refill: 3  Chronic obstructive pulmonary disease, unspecified COPD type Assessment & Plan: The current medical regimen is effective;  continue present plan and medications.  Sent Breztri.  Orders: -     Breztri Aerosphere; Inhale 2 puffs into the lungs 2 (two) times daily.  Dispense: 3 each; Refill: 3  Body mass index (BMI) less than 16.5 Assessment & Plan: Start Mirtazapine 15 mg daily at bedtime.  Orders: -     Mirtazapine; Take 1 tablet (15 mg total) by mouth at bedtime.  Dispense: 30 tablet; Refill: 2     Meds ordered this encounter  Medications   mirtazapine (REMERON SOL-TAB) 15 MG disintegrating tablet    Sig: Take 1 tablet (15 mg total) by mouth at bedtime.    Dispense:  30 tablet    Refill:  2   Budeson-Glycopyrrol-Formoterol (BREZTRI AEROSPHERE) 160-9-4.8 MCG/ACT AERO    Sig: Inhale 2 puffs into the lungs 2 (two) times daily.    Dispense:  3 each    Refill:  3   LORazepam (ATIVAN) 0.5 MG tablet    Sig: Take 1 tablet (0.5 mg total) by mouth 2 (two) times daily as needed for anxiety.    Dispense:  60 tablet    Refill:  2   escitalopram (LEXAPRO) 20 MG tablet    Sig: Take 1 tablet (20 mg total) by mouth daily.    Dispense:  90 tablet    Refill:  0   rosuvastatin (CRESTOR) 20 MG tablet    Sig: Take 1 tablet (20 mg total) by mouth daily.    Dispense:  90 tablet    Refill:  1    No orders of the defined types were placed in this encounter.    Follow-up: Return in about 3 months (around 03/15/2023) for chronic fasting.  I,Marla I Leal-Borjas,acting as a scribe for Blane Ohara, MD.,have documented all relevant documentation on the behalf of Blane Ohara, MD,as directed by  Blane Ohara, MD while in the  presence of Blane Ohara, MD.    An After Visit Summary was printed and given to the patient.  Blane Ohara, MD [facility] [phone]

## 2022-12-18 DIAGNOSIS — F330 Major depressive disorder, recurrent, mild: Secondary | ICD-10-CM

## 2022-12-18 DIAGNOSIS — F331 Major depressive disorder, recurrent, moderate: Secondary | ICD-10-CM | POA: Insufficient documentation

## 2022-12-18 DIAGNOSIS — Z681 Body mass index (BMI) 19 or less, adult: Secondary | ICD-10-CM

## 2022-12-18 HISTORY — DX: Major depressive disorder, recurrent, mild: F33.0

## 2022-12-18 HISTORY — DX: Body mass index (BMI) 19.9 or less, adult: Z68.1

## 2022-12-18 NOTE — Assessment & Plan Note (Addendum)
The current medical regimen is effective;  continue present plan and medications.  Sent Breztri.

## 2022-12-18 NOTE — Assessment & Plan Note (Signed)
Well controlled.  No changes to medicines. Rosuvastatin 20 mg daily. Continue to work on eating a healthy diet and exercise.

## 2022-12-18 NOTE — Assessment & Plan Note (Signed)
Start Mirtazapine 15 mg daily at bedtime.

## 2022-12-18 NOTE — Assessment & Plan Note (Addendum)
The current medical regimen is effective;  continue present plan and medications.  Ativan 0.5 mg three times a day as needed and Lexapro 20 mg daily.

## 2022-12-18 NOTE — Assessment & Plan Note (Signed)
Sent Breztri

## 2022-12-18 NOTE — Assessment & Plan Note (Signed)
The current medical regimen is effective;  continue present plan and medications. Lexapro 20 mg daily  

## 2023-01-10 ENCOUNTER — Other Ambulatory Visit: Payer: Self-pay | Admitting: Cardiology

## 2023-01-11 ENCOUNTER — Other Ambulatory Visit: Payer: Self-pay | Admitting: Cardiology

## 2023-01-18 ENCOUNTER — Ambulatory Visit (INDEPENDENT_AMBULATORY_CARE_PROVIDER_SITE_OTHER): Payer: Medicaid Other

## 2023-01-18 DIAGNOSIS — I442 Atrioventricular block, complete: Secondary | ICD-10-CM | POA: Diagnosis not present

## 2023-01-18 LAB — CUP PACEART REMOTE DEVICE CHECK
Date Time Interrogation Session: 20240520082730
Implantable Lead Connection Status: 753985
Implantable Lead Connection Status: 753985
Implantable Lead Implant Date: 20220221
Implantable Lead Implant Date: 20220221
Implantable Lead Location: 753859
Implantable Lead Location: 753860
Implantable Lead Model: 377171
Implantable Lead Model: 377171
Implantable Lead Serial Number: 8000187789
Implantable Lead Serial Number: 8000197459
Implantable Pulse Generator Implant Date: 20220221
Pulse Gen Model: 407145
Pulse Gen Serial Number: 70053960

## 2023-02-05 ENCOUNTER — Other Ambulatory Visit: Payer: Self-pay | Admitting: Cardiology

## 2023-02-12 NOTE — Progress Notes (Signed)
Remote pacemaker transmission.   

## 2023-03-08 ENCOUNTER — Other Ambulatory Visit: Payer: Self-pay | Admitting: Family Medicine

## 2023-03-08 DIAGNOSIS — Z681 Body mass index (BMI) 19 or less, adult: Secondary | ICD-10-CM

## 2023-03-25 ENCOUNTER — Other Ambulatory Visit (HOSPITAL_COMMUNITY): Payer: Self-pay | Admitting: Interventional Radiology

## 2023-03-25 DIAGNOSIS — I771 Stricture of artery: Secondary | ICD-10-CM

## 2023-04-05 ENCOUNTER — Ambulatory Visit (HOSPITAL_COMMUNITY): Payer: Medicaid Other

## 2023-04-06 ENCOUNTER — Encounter: Payer: Self-pay | Admitting: Family Medicine

## 2023-04-06 ENCOUNTER — Ambulatory Visit (INDEPENDENT_AMBULATORY_CARE_PROVIDER_SITE_OTHER): Payer: Medicaid Other | Admitting: Family Medicine

## 2023-04-06 VITALS — BP 94/62 | HR 68 | Temp 97.1°F | Ht 64.0 in | Wt 94.0 lb

## 2023-04-06 DIAGNOSIS — Z0001 Encounter for general adult medical examination with abnormal findings: Secondary | ICD-10-CM

## 2023-04-06 DIAGNOSIS — E43 Unspecified severe protein-calorie malnutrition: Secondary | ICD-10-CM | POA: Diagnosis not present

## 2023-04-06 DIAGNOSIS — Z1231 Encounter for screening mammogram for malignant neoplasm of breast: Secondary | ICD-10-CM | POA: Diagnosis not present

## 2023-04-06 DIAGNOSIS — Z681 Body mass index (BMI) 19 or less, adult: Secondary | ICD-10-CM

## 2023-04-06 DIAGNOSIS — R634 Abnormal weight loss: Secondary | ICD-10-CM | POA: Diagnosis not present

## 2023-04-06 DIAGNOSIS — F411 Generalized anxiety disorder: Secondary | ICD-10-CM | POA: Diagnosis not present

## 2023-04-06 DIAGNOSIS — J449 Chronic obstructive pulmonary disease, unspecified: Secondary | ICD-10-CM | POA: Diagnosis not present

## 2023-04-06 DIAGNOSIS — F17219 Nicotine dependence, cigarettes, with unspecified nicotine-induced disorders: Secondary | ICD-10-CM | POA: Diagnosis not present

## 2023-04-06 DIAGNOSIS — F17211 Nicotine dependence, cigarettes, in remission: Secondary | ICD-10-CM

## 2023-04-06 DIAGNOSIS — Z122 Encounter for screening for malignant neoplasm of respiratory organs: Secondary | ICD-10-CM

## 2023-04-06 DIAGNOSIS — E559 Vitamin D deficiency, unspecified: Secondary | ICD-10-CM

## 2023-04-06 DIAGNOSIS — E782 Mixed hyperlipidemia: Secondary | ICD-10-CM | POA: Diagnosis not present

## 2023-04-06 DIAGNOSIS — F331 Major depressive disorder, recurrent, moderate: Secondary | ICD-10-CM

## 2023-04-06 MED ORDER — MIRTAZAPINE 30 MG PO TABS: 30.0000 mg | ORAL_TABLET | Freq: Every day | ORAL | 1 refills | Status: DC

## 2023-04-06 NOTE — Assessment & Plan Note (Signed)
Sent Breztri

## 2023-04-06 NOTE — Assessment & Plan Note (Signed)
The current medical regimen is effective;  continue present plan and medications.  Ativan 0.5 mg three times a day as needed and Lexapro 20 mg daily.

## 2023-04-06 NOTE — Assessment & Plan Note (Signed)
Increased mirtazapine 30 mg daily

## 2023-04-06 NOTE — Assessment & Plan Note (Signed)
The current medical regimen is effective;  continue present plan and medications.  Sent Breztri.

## 2023-04-06 NOTE — Patient Instructions (Signed)
Speak to pharmacy about getting Shingrix (Shingles) vaccine.

## 2023-04-06 NOTE — Progress Notes (Unsigned)
Subjective:  Patient ID: Leslie Franklin, female    DOB: 1960-12-19  Age: 62 y.o. MRN: [id number]  Chief Complaint  Patient presents with   CPE/3 month follow Up    HPI   Well Adult Physical: Patient here for a comprehensive physical exam.The patient reports no problems Do you take any herbs or supplements that were not prescribed by a doctor? no Are you taking calcium supplements? no Are you taking aspirin daily? No, but takes plavix 75 mg daily  Encounter for general adult medical examination without abnormal findings  Physical ("At Risk" items are starred): Patient's last physical exam was 1 year ago .  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 Patient practices appropriate gun safety. Patient does not wear sunscreen with extended sun exposure. Dental Care: trying to get dentures. Ophthalmology/Optometry: Annual visit.  Hearing loss: none Vision impairments: reading glasses  Menarche: 62 y.o Menstrual History: irregular LMP: S/P menopause Pregnancy history: 4 Safe at home: yes Self breast exams: yes  PAP due 10/2025. last PAP normal, HPV negative 10/01/2020  High Cholesterol: Crestor 20 mg daily. Patient does not eat healthy. Has no appetite.   GAD: Ativan 0.5 mg three times a day as needed. Usually takes once daily.   Depression: Lexapro 20 mg daily.   Calorie malnutrition: Protein shakes twice daily.   COPD: Not on breztri. Did not get a refill. Has issues with breathing with exertion.      04/06/2023    8:39 AM 12/14/2022   11:48 AM 12/14/2022   10:44 AM 10/05/2022    8:20 AM 09/07/2022    8:54 AM  Depression screen PHQ 2/9  Decreased Interest 2  0 1 3  Down, Depressed, Hopeless 2  0 0 0  PHQ - 2 Score 4  0 1 3  Altered sleeping 2 1  1 1   Tired, decreased energy 2 1  2 2   Change in appetite 3 3  3  0  Feeling bad or failure about yourself  0 0  0 0   Trouble concentrating 0 1  1 1   Moving slowly or fidgety/restless 0 1  0 3  Suicidal thoughts 0 0  0 0  PHQ-9 Score 11   8 10   Difficult doing work/chores Somewhat difficult Very difficult  Somewhat difficult Very difficult         06/04/2022    8:50 AM 08/03/2022    7:40 AM 10/05/2022   11:30 AM 12/14/2022   10:44 AM 04/06/2023    8:39 AM  Fall Risk  Falls in the past year?  0 0 0 1  Was there an injury with Fall?  0 0 0 0  Fall Risk Category Calculator  0 0 0 1  Fall Risk Category (Retired)  Low     (RETIRED) Patient Fall Risk Level Low fall risk Low fall risk     Patient at Risk for Falls Due to  No Fall Risks No Fall Risks No Fall Risks No Fall Risks  Fall risk Follow up  Falls evaluation completed  Falls evaluation completed Falls evaluation completed             Social Hx   Social History   Socioeconomic History   Marital status: Single    Spouse name: Not on file   Number of children: 4   Years of education: Not on file   Highest education  level: 8th grade  Occupational History   Not on file  Tobacco Use   Smoking status: Every Day    Current packs/day: 0.50    Average packs/day: 1 pack/day for 41.1 years (41.1 ttl pk-yrs)    Types: Cigarettes    Start date: 09/01/1979    Last attempt to quit: 08/31/2020   Smokeless tobacco: Never  Vaping Use   Vaping status: Never Used  Substance and Sexual Activity   Alcohol use: Never   Drug use: Never   Sexual activity: Yes    Partners: Male    Birth control/protection: Post-menopausal  Other Topics Concern   Not on file  Social History Narrative   Not on file   Social Determinants of Health   Financial Resource Strain: Medium Risk (12/14/2022)   Overall Financial Resource Strain (CARDIA)    Difficulty of Paying Living Expenses: Somewhat hard  Food Insecurity: No Food Insecurity (12/14/2022)   Hunger Vital Sign    Worried About Running Out of Food in the Last Year: Never true    Ran Out of Food in the Last Year:  Never true  Transportation Needs: No Transportation Needs (12/14/2022)   PRAPARE - Administrator, Civil Service (Medical): No    Lack of Transportation (Non-Medical): No  Physical Activity: Sufficiently Active (12/14/2022)   Exercise Vital Sign    Days of Exercise per Week: 6 days    Minutes of Exercise per Session: 30 min  Stress: Stress Concern Present (12/14/2022)   [employer] of Occupational Health - Occupational Stress Questionnaire    Feeling of Stress : Rather much  Social Connections: Moderately Isolated (04/06/2023)   Social Connection and Isolation Panel [NHANES]    Frequency of Communication with Friends and Family: Three times a week    Frequency of Social Gatherings with Friends and Family: Three times a week    Attends Religious Services: 1 to 4 times per year    Active Member of Clubs or Organizations: No    Attends Banker Meetings: Never    Marital Status: Divorced   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   Hyperlipidemia    Palpitations 09/18/2020   Peripheral vascular disease (HCC)    Presence of permanent cardiac pacemaker 11/20/2020   Screening mammogram for breast cancer 09/12/2020   Stroke (HCC)    Syncope 09/12/2020   Past Surgical History:  Procedure Laterality Date   CATARACT EXTRACTION BILATERAL W/ ANTERIOR VITRECTOMY Bilateral 11/2021   IR ANGIO INTRA EXTRACRAN SEL COM CAROTID INNOMINATE BILAT MOD SED  04/07/2021   IR ANGIO INTRA EXTRACRAN SEL COM CAROTID INNOMINATE BILAT MOD SED  06/04/2022   IR ANGIO INTRA  EXTRACRAN SEL INTERNAL CAROTID UNI R MOD SED  04/21/2021   IR ANGIO VERTEBRAL SEL SUBCLAVIAN INNOMINATE BILAT MOD SED  04/07/2021   IR ANGIO VERTEBRAL SEL VERTEBRAL BILAT MOD SED  06/04/2022   IR ANGIOGRAM FOLLOW UP STUDY  04/21/2021   IR ANGIOGRAM FOLLOW UP STUDY  04/21/2021   IR CT HEAD LTD  04/21/2021   IR  RADIOLOGIST EVAL & MGMT  03/12/2021   IR RADIOLOGIST EVAL & MGMT  05/07/2021   IR TRANSCATH/EMBOLIZ  04/21/2021   NO PAST SURGERIES     PACEMAKER IMPLANT N/A 10/21/2020   Procedure: PACEMAKER IMPLANT;  Surgeon: Marinus Maw, MD;  Location: [facility];  Service: Cardiovascular;  Laterality: N/A;   RADIOLOGY WITH ANESTHESIA N/A 04/07/2021   Procedure: EMBOLIZATION;  Surgeon: Julieanne Cotton, MD;  Location: [facility] OR;  Service: Radiology;  Laterality: N/A;   RADIOLOGY WITH ANESTHESIA N/A 04/21/2021   Procedure: RADIOLOGY WITH ANESTHESIA EMBOLIZATION;  Surgeon: Julieanne Cotton, MD;  Location: [facility] OR;  Service: Radiology;  Laterality: N/A;   TUBAL LIGATION      Family History  Problem Relation Age of Onset   Hypertension Mother    Diabetes Mother    Heart attack Father    Stroke Father    Emphysema Sister    Breast cancer Daughter    Stroke Brother     Review of Systems  Constitutional:  Negative for chills, fatigue and fever.  HENT:  Negative for congestion, ear pain, rhinorrhea and sore throat.   Respiratory:  Negative for cough and shortness of breath.   Cardiovascular:  Negative for chest pain.  Gastrointestinal:  Negative for abdominal pain, constipation, diarrhea, nausea and vomiting.  Genitourinary:  Negative for dysuria and urgency.  Musculoskeletal:  Negative for back pain and myalgias.  Neurological:  Negative for dizziness, weakness, light-headedness and headaches.  Psychiatric/Behavioral:  Negative for dysphoric mood. The patient is not nervous/anxious.      Objective:  BP 94/62   Pulse 68   Temp (!) 97.1 F (36.2 C)   Ht 5\' 4"  (1.626 m)   Wt 94  lb (42.6 kg)   SpO2 98%   BMI 16.14 kg/m      04/06/2023    8:58 AM 12/14/2022   10:42 AM 10/05/2022    8:04 AM  BP/Weight  Systolic BP 94 124 120  Diastolic BP 62 74 60  Wt. (Lbs) 94 94.8 98  BMI 16.14 kg/m2 16.27 kg/m2 17.36 kg/m2    Physical Exam Vitals reviewed.  Constitutional:      Appearance: Normal appearance.  HENT:     Right Ear: Tympanic membrane normal.     Left Ear: Tympanic membrane normal.     Nose: Nose normal.     Mouth/Throat:     Pharynx: No oropharyngeal exudate or posterior oropharyngeal erythema.  Eyes:     Conjunctiva/sclera: Conjunctivae normal.  Neck:     Vascular: No carotid bruit.  Cardiovascular:     Rate and Rhythm: Normal rate and regular rhythm.     Pulses: Normal pulses.     Heart sounds: Normal heart sounds.  Pulmonary:     Effort: Pulmonary effort is normal.     Breath sounds: Normal breath sounds.  Abdominal:     General: Bowel sounds are normal.     Palpations: There is no mass.     Tenderness: There is no abdominal tenderness.  Musculoskeletal:     Cervical back: Normal range of motion.  Skin:    Findings: No lesion.  Neurological:     Mental Status: She is alert and oriented to person, place, and time.  Psychiatric:        Mood and Affect: Mood normal.        Behavior: Behavior normal.     Lab Results  Component Value Date   WBC 11.2 (H) 04/06/2023   HGB 14.9  04/06/2023   HCT 45.4 04/06/2023   PLT 260 04/06/2023   GLUCOSE 90 04/06/2023   CHOL 156 08/03/2022   TRIG 74 08/03/2022   HDL 53 08/03/2022   LDLCALC 89 08/03/2022   ALT 20 04/06/2023   AST 29 04/06/2023   NA 142 04/06/2023   K 4.4 04/06/2023   CL 105 04/06/2023   CREATININE 0.82 04/06/2023   BUN 15 04/06/2023   CO2 21 04/06/2023   TSH 2.010 04/06/2023   INR 1.0 06/04/2022      Assessment & Plan:  Encounter for screening mammogram for malignant neoplasm of breast -     Digital Screening Mammogram, Left and Right; Future  Mixed  hyperlipidemia Assessment & Plan: Well controlled.  No changes to medicines. Rosuvastatin 20 mg daily. Continue to work on eating a healthy diet and exercise.    Orders: -     CBC with Differential/Platelet -     Comprehensive metabolic panel -     TSH  GAD (generalized anxiety disorder) Assessment & Plan: The current medical regimen is effective;  continue present plan and medications.  Ativan 0.5 mg three times a day as needed and Lexapro 20 mg daily.    Moderate episode of recurrent major depressive disorder [facility]) Assessment & Plan: The current medical regimen is effective;  continue present plan and medications.  Lexapro 20 mg daily.   Cigarette nicotine dependence in remission Assessment & Plan: Sent Breztri.   Chronic obstructive pulmonary disease, unspecified COPD type (HCC) Assessment & Plan: The current medical regimen is effective;  continue present plan and medications.  Sent Breztri.   Weight loss, unintentional Assessment & Plan: Encouraged to drink Ensure, milk, cheese and weight gain foods Advised to take Zofran first and wait for 30 mins and start to take medicines for the day Continue taking high protein and high calorie diet and provided the list of high calorie foods Will keep eye on this (so far normal colonoscopy, normal hemoglobin) Samples of protein drinks given   Orders: -     TSH  Screening for lung cancer  Body mass index (BMI) less than 16.5 Assessment & Plan: Increased mirtazapine 30 mg daily   Vitamin D deficiency -     VITAMIN D 25 Hydroxy (Vit-D Deficiency, Fractures)  Other orders -     Mirtazapine; Take 1 tablet (30 mg total) by mouth at bedtime.  Dispense: 90 tablet; Refill: 1     Body mass index is 16.14 kg/m.   These are the goals we discussed:  Goals   None      This is a list of the screening recommended for you and due dates:  Health Maintenance  Topic Date Due   Zoster (Shingles) Vaccine (1 of 2) Never  done   Flu Shot  04/01/2023   Screening for Lung Cancer  08/29/2023   Pap Smear  10/02/2023   Mammogram  02/26/2024   Colon Cancer Screening  01/14/2031   HPV Vaccine  Aged Out   DTaP/Tdap/Td vaccine  Discontinued   COVID-19 Vaccine  Discontinued     Meds ordered this encounter  Medications   mirtazapine (REMERON) 30 MG tablet    Sig: Take 1 tablet (30 mg total) by mouth at bedtime.    Dispense:  90 tablet    Refill:  1    Follow-up: Return in about 3 months (around 07/07/2023) for chronic.  An After Visit Summary was printed and given to the patient.  Blane Ohara, MD [facility] [phone]

## 2023-04-06 NOTE — Assessment & Plan Note (Signed)
The current medical regimen is fairly effective;  continue present plan and medications.  Lexapro 20 mg daily.  Increase mirtazapine to 30 mg nightly.  Hopefully this will help with depression, insomnia, and appetite.

## 2023-04-06 NOTE — Assessment & Plan Note (Signed)
Encouraged to drink Ensure, milk, cheese and weight gain foods Advised to take Zofran first and wait for 30 mins and start to take medicines for the day Continue taking high protein and high calorie diet and provided the list of high calorie foods Will keep eye on this (so far normal colonoscopy, normal hemoglobin) Samples of protein drinks given

## 2023-04-06 NOTE — Assessment & Plan Note (Signed)
Well controlled.  No changes to medicines. Rosuvastatin 20 mg daily. Continue to work on eating a healthy diet and exercise.   

## 2023-04-08 ENCOUNTER — Other Ambulatory Visit: Payer: Self-pay | Admitting: Family Medicine

## 2023-04-08 DIAGNOSIS — Z0001 Encounter for general adult medical examination with abnormal findings: Secondary | ICD-10-CM | POA: Insufficient documentation

## 2023-04-08 DIAGNOSIS — Z1231 Encounter for screening mammogram for malignant neoplasm of breast: Secondary | ICD-10-CM | POA: Insufficient documentation

## 2023-04-08 DIAGNOSIS — Z122 Encounter for screening for malignant neoplasm of respiratory organs: Secondary | ICD-10-CM | POA: Insufficient documentation

## 2023-04-08 DIAGNOSIS — R7989 Other specified abnormal findings of blood chemistry: Secondary | ICD-10-CM

## 2023-04-08 HISTORY — DX: Encounter for screening for malignant neoplasm of respiratory organs: Z12.2

## 2023-04-08 NOTE — Assessment & Plan Note (Signed)
Check vitamin D. 

## 2023-04-08 NOTE — Assessment & Plan Note (Signed)
Check mammogram 

## 2023-04-08 NOTE — Assessment & Plan Note (Signed)
Patient is not very healthy.  She is continuing to lose weight.  She is not eating.  Continues to smoke. Education given.  Strongly recommend she quit smoking.

## 2023-04-08 NOTE — Assessment & Plan Note (Addendum)
Check lung CT scan low contrast

## 2023-04-14 ENCOUNTER — Ambulatory Visit (HOSPITAL_COMMUNITY): Payer: Medicaid Other

## 2023-04-16 ENCOUNTER — Other Ambulatory Visit: Payer: Medicaid Other

## 2023-04-16 ENCOUNTER — Other Ambulatory Visit: Payer: Self-pay

## 2023-04-16 ENCOUNTER — Ambulatory Visit (HOSPITAL_COMMUNITY)
Admission: RE | Admit: 2023-04-16 | Discharge: 2023-04-16 | Disposition: A | Discharge status: Home / Self Care | Payer: Medicaid Other | Source: Ambulatory Visit | Attending: Interventional Radiology | Admitting: Interventional Radiology

## 2023-04-16 DIAGNOSIS — I771 Stricture of artery: Secondary | ICD-10-CM

## 2023-04-16 DIAGNOSIS — R7989 Other specified abnormal findings of blood chemistry: Secondary | ICD-10-CM

## 2023-04-16 LAB — CBC WITH DIFFERENTIAL/PLATELET
Basophils Absolute: 0.1 10*3/uL (ref 0.0–0.2)
Basos: 1 %
EOS (ABSOLUTE): 0.3 10*3/uL (ref 0.0–0.4)
Eos: 3 %
Hematocrit: 42.9 % (ref 34.0–46.6)
Hemoglobin: 14.1 g/dL (ref 11.1–15.9)
Immature Grans (Abs): 0 10*3/uL (ref 0.0–0.1)
Immature Granulocytes: 0 %
Lymphocytes Absolute: 3.2 10*3/uL — ABNORMAL HIGH (ref 0.7–3.1)
Lymphs: 29 %
MCH: 30.7 pg (ref 26.6–33.0)
MCHC: 32.9 g/dL (ref 31.5–35.7)
MCV: 94 fL (ref 79–97)
Monocytes Absolute: 0.8 10*3/uL (ref 0.1–0.9)
Monocytes: 7 %
Neutrophils Absolute: 6.6 10*3/uL (ref 1.4–7.0)
Neutrophils: 60 %
Platelets: 275 10*3/uL (ref 150–450)
RBC: 4.59 x10E6/uL (ref 3.77–5.28)
RDW: 11.9 % (ref 11.7–15.4)
WBC: 11 10*3/uL — ABNORMAL HIGH (ref 3.4–10.8)

## 2023-04-19 ENCOUNTER — Ambulatory Visit (INDEPENDENT_AMBULATORY_CARE_PROVIDER_SITE_OTHER): Payer: Medicaid Other

## 2023-04-19 DIAGNOSIS — I442 Atrioventricular block, complete: Secondary | ICD-10-CM

## 2023-04-20 LAB — CUP PACEART REMOTE DEVICE CHECK
Battery Voltage: 80
Date Time Interrogation Session: 20240820085150
Implantable Lead Connection Status: 753985
Implantable Lead Connection Status: 753985
Implantable Lead Implant Date: 20220221
Implantable Lead Implant Date: 20220221
Implantable Lead Location: 753859
Implantable Lead Location: 753860
Implantable Lead Model: 377171
Implantable Lead Model: 377171
Implantable Lead Serial Number: 8000187789
Implantable Lead Serial Number: 8000197459
Implantable Pulse Generator Implant Date: 20220221
Pulse Gen Model: 407145
Pulse Gen Serial Number: 70053960

## 2023-04-22 ENCOUNTER — Telehealth (HOSPITAL_COMMUNITY): Payer: Self-pay

## 2023-04-22 NOTE — Telephone Encounter (Signed)
Called regarding recent imaging, no answer, left vm. AB

## 2023-04-26 ENCOUNTER — Other Ambulatory Visit: Payer: Self-pay | Admitting: Family Medicine

## 2023-04-26 DIAGNOSIS — F331 Major depressive disorder, recurrent, moderate: Secondary | ICD-10-CM

## 2023-04-26 DIAGNOSIS — F411 Generalized anxiety disorder: Secondary | ICD-10-CM

## 2023-04-28 NOTE — Progress Notes (Unsigned)
NEUROLOGY FOLLOW UP OFFICE NOTE  Leslie Franklin [id number]  Assessment/Plan:   1.  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  Migraine without aura, without status migrainosus, not intractable, onset with discovery of aneurysm 3.  Cerebrovascular disease    1.  Advised to contact Dr. Fatima Sanger office to follow up.  I sent a staff message to him myself.   2.  Continue current medications for secondary stroke prevention as managed by PCP:  Plavix, Crestor 3. Follow up 6 months.     Subjective:  Leslie Franklin is a 62 year old right-handed female with COPD, HLD, depression and history of complete heart block s/p PPM and essential tremor who follows up for cerebral aneurysm.  UPDATE: She had a repeat cerebral angiogram on 06/05/2022 which continued to show endovascular obliteration of the large right paraophthalmic region aneurysm with coil assisted flow diversion and no intra stent stenosis.  Recommended follow up carotid ultrasound in 6 months.  Bilateral carotid Doppler on 04/16/2023 revealed 40-59% stenosis in the right ICA but other vessels normal, including left ICA, vertebral arteries and subclavians.     HISTORY: She began experiencing recurrent dizziness and syncope for maybe 10 years but started becoming worse since 2018.  The dizziness feels like "getting up too fast".  It occurs when standing, moving or sitting.  It lasts a couple of minutes.  Usually will lay down.  It is associated with tunnel vision, palpitations, diaphoresis and feeling hot.  No nausea.  She sometimes feels like she is going to pass out and has actually passed out at least three times.  No postictal confusion, tongue/cheek laceration or incontinence.  She has been evaluated by cardiology.  Echocardiogram on 10/14/2020 was unremarkable with EF 60-65% with no valvular abnormalities or PFO/IAS.  Cardiac monitor has recorded episodes of daytime complete heart block.  She had a  pacemaker implanted on 10/21/2020.  To further evaluate symptoms, she had a CT head on 11/12/2020 which demonstrated age indeterminate small lacunar infarcts in the right frontal cortical regiion and right cerebellar hemisphere.  She was started on ASA 81mg  daily.  Carotid ultrasound on 01/06/2021 showed less than 50% stenosis in the bilateral carotid arteries and antegrade flow in the vertebral arteries bilaterally.  She was started on Crestor.  LDL from 12/25/2020 was 110 (down from 218 in January).  TSH from 09/13/2020 was 1.220.  Since the pacemaker, she has less dizzy spells but they still occurred daily.  Underwent imaging on 03/06/2021.  MRI of brain without contrast showed mild chronic small vessel ischemic changes and redemonstrated small chronic cortical/subcortical infarct within the right middle frontal gyrus and three chronic lacunar nfarcts within the right cerebellar hemisphere but no acute abnormalities.  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  She was referred to interventional radiology and underwent endovascular treatment using pipeline flow diverter device  Dizziness has improved after the surgery.    Since the surgery, still with headache but overall improved.  Described as a left sided supraorbital/ear pressure headache still occurs but improved.  May occur off and on for up to 4 days.  It occurs infrequently.  Last one occurred 2 weeks ago.  She doesn't take anything for it.  Tylenol ineffective.  Will just lay down.  Associated dizziness, nausea, photophobia and phonophobia.  PAST MEDICAL HISTORY: 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   Cardiac murmur 09/18/2020   Carotid atherosclerosis 02/26/2021   Cerebral aneurysm 04/07/2021    Chest pain of uncertain etiology 09/18/2020   Claudication (HCC) 09/12/2020   COPD (chronic obstructive pulmonary disease) (HCC)    Depression    Depression    Dyspnea    Ex-smoker 09/18/2020   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   Hyperlipidemia    Palpitations 09/18/2020   Peripheral vascular disease (HCC)    Presence of permanent cardiac pacemaker 11/20/2020   Screening mammogram for breast cancer 09/12/2020   Stroke [facility])    Syncope 09/12/2020    MEDICATIONS: Current Outpatient Medications on File Prior to Visit  Medication Sig Dispense Refill   aspirin 81 MG chewable tablet Chew 81 mg by mouth daily.     Budeson-Glycopyrrol-Formoterol (BREZTRI AEROSPHERE) 160-9-4.8 MCG/ACT AERO Inhale 2 puffs into the lungs 2 (two) times daily. 3 each 3   clopidogrel (PLAVIX) 75 MG tablet Take 0.5 tablets (37.5 mg total) by mouth 3 (three) times a week. Patient needs an appointment for further refills. 2 nd attempt 4 tablet 0   escitalopram (LEXAPRO) 20 MG tablet TAKE 1 TABLET(20 MG) BY MOUTH DAILY 90 tablet 0   LORazepam (ATIVAN) 0.5 MG tablet Take 1 tablet (0.5 mg total) by mouth 2 (two) times daily as needed for anxiety. 60 tablet 2   mirtazapine (REMERON) 30 MG tablet Take 1 tablet (30 mg total) by mouth at bedtime. 90 tablet 1   ondansetron (ZOFRAN) 4 MG tablet Take 1 tablet (4 mg total) by mouth every 8 (eight) hours as needed for nausea or vomiting. 30 tablet 1   rosuvastatin (CRESTOR) 20 MG tablet Take 1 tablet (20 mg total) by mouth daily. 90 tablet 1   No current facility-administered medications on file prior to visit.    ALLERGIES: Allergies  Allergen Reactions   Buspirone Nausea Only    FAMILY HISTORY: Family History  Problem Relation Age of Onset   Hypertension Mother    Diabetes Mother    Heart attack Father    Stroke Father    Emphysema Sister    Breast cancer Daughter    Stroke Brother       *** General: No acute distress.  Patient appears well-groomed.    Head:  Normocephalic/atraumatic Eyes:  Fundi examined but not visualized Heart:  RRR Neurological Exam: alert and oriented.  Speech fluent and not dysarthric, language intact.  CN II-XII intact. Bulk and tone normal, muscle strength 5/5 throughout.  Mild kinetic tremor in hands.  Sensation to light touch intact.  Deep tendon reflexes 2+ throughout.  Finger to nose testing intact.  Gait normal, Romberg negative.   Shon Millet, DO  CC: Flonnie Hailstone, NP

## 2023-04-29 ENCOUNTER — Ambulatory Visit: Payer: Medicaid Other | Admitting: Neurology

## 2023-04-29 ENCOUNTER — Encounter: Payer: Self-pay | Admitting: Neurology

## 2023-04-29 VITALS — BP 112/63 | HR 93 | Ht 63.0 in | Wt 99.0 lb

## 2023-04-29 DIAGNOSIS — Z8679 Personal history of other diseases of the circulatory system: Secondary | ICD-10-CM | POA: Diagnosis not present

## 2023-04-29 DIAGNOSIS — Z9889 Other specified postprocedural states: Secondary | ICD-10-CM

## 2023-04-29 DIAGNOSIS — I6521 Occlusion and stenosis of right carotid artery: Secondary | ICD-10-CM | POA: Diagnosis not present

## 2023-04-29 DIAGNOSIS — I679 Cerebrovascular disease, unspecified: Secondary | ICD-10-CM

## 2023-04-29 DIAGNOSIS — G43009 Migraine without aura, not intractable, without status migrainosus: Secondary | ICD-10-CM

## 2023-04-29 NOTE — Patient Instructions (Addendum)
Continue aspirin, Crestor  Repeat bilateral carotid ultrasound in one year Follow up with me after repeat carotid ultrasound in one year

## 2023-04-29 NOTE — Progress Notes (Signed)
Remote pacemaker transmission.   

## 2023-05-04 ENCOUNTER — Encounter: Payer: Self-pay | Admitting: Family Medicine

## 2023-05-04 ENCOUNTER — Ambulatory Visit: Payer: Medicaid Other | Admitting: Family Medicine

## 2023-05-04 VITALS — BP 124/78 | HR 85 | Temp 97.2°F | Ht 63.0 in | Wt 99.0 lb

## 2023-05-04 DIAGNOSIS — F4321 Adjustment disorder with depressed mood: Secondary | ICD-10-CM | POA: Diagnosis not present

## 2023-05-04 DIAGNOSIS — D72829 Elevated white blood cell count, unspecified: Secondary | ICD-10-CM

## 2023-05-04 DIAGNOSIS — E43 Unspecified severe protein-calorie malnutrition: Secondary | ICD-10-CM

## 2023-05-04 DIAGNOSIS — F5104 Psychophysiologic insomnia: Secondary | ICD-10-CM

## 2023-05-04 DIAGNOSIS — F331 Major depressive disorder, recurrent, moderate: Secondary | ICD-10-CM

## 2023-05-04 DIAGNOSIS — D72828 Other elevated white blood cell count: Secondary | ICD-10-CM | POA: Diagnosis not present

## 2023-05-04 DIAGNOSIS — F4320 Adjustment disorder, unspecified: Secondary | ICD-10-CM

## 2023-05-04 HISTORY — DX: Psychophysiologic insomnia: F51.04

## 2023-05-04 HISTORY — DX: Elevated white blood cell count, unspecified: D72.829

## 2023-05-04 HISTORY — DX: Unspecified severe protein-calorie malnutrition: E43

## 2023-05-04 HISTORY — DX: Adjustment disorder, unspecified: F43.20

## 2023-05-04 LAB — CBC WITH DIFFERENTIAL/PLATELET
Basophils Absolute: 0.1 10*3/uL (ref 0.0–0.2)
Basos: 1 %
EOS (ABSOLUTE): 0.2 10*3/uL (ref 0.0–0.4)
Eos: 2 %
Hematocrit: 44.2 % (ref 34.0–46.6)
Hemoglobin: 14.3 g/dL (ref 11.1–15.9)
Immature Grans (Abs): 0 10*3/uL (ref 0.0–0.1)
Immature Granulocytes: 0 %
Lymphocytes Absolute: 2.2 10*3/uL (ref 0.7–3.1)
Lymphs: 24 %
MCH: 31.2 pg (ref 26.6–33.0)
MCHC: 32.4 g/dL (ref 31.5–35.7)
MCV: 97 fL (ref 79–97)
Monocytes Absolute: 0.6 10*3/uL (ref 0.1–0.9)
Monocytes: 6 %
Neutrophils Absolute: 6.2 10*3/uL (ref 1.4–7.0)
Neutrophils: 67 %
Platelets: 256 10*3/uL (ref 150–450)
RBC: 4.58 x10E6/uL (ref 3.77–5.28)
RDW: 11.8 % (ref 11.7–15.4)
WBC: 9.3 10*3/uL (ref 3.4–10.8)

## 2023-05-04 MED ORDER — MIRTAZAPINE 45 MG PO TABS: 45.0000 mg | ORAL_TABLET | Freq: Every day | ORAL | 0 refills | Status: DC

## 2023-05-04 NOTE — Progress Notes (Signed)
Subjective:  Patient ID: Leslie Franklin, female    DOB: 1960/10/21  Age: 62 y.o. MRN: [id number]  Chief Complaint  Patient presents with   Weight Management Screening    HPI Patient presents for 4 week follow up for weight management. During last visit was  Encouraged to drink Ensure (2 per day), milk, cheese and weight gain foods has continued eating high protein and high calorie diet. Poor sleep.  Patients daughter passed 04/22/23. Breast cancer.     04/06/2023    8:39 AM 12/14/2022   11:48 AM 12/14/2022   10:44 AM 10/05/2022    8:20 AM 09/07/2022    8:54 AM  Depression screen PHQ 2/9  Decreased Interest 2  0 1 3  Down, Depressed, Hopeless 2  0 0 0  PHQ - 2 Score 4  0 1 3  Altered sleeping 2 1  1 1   Tired, decreased energy 2 1  2 2   Change in appetite 3 3  3  0  Feeling bad or failure about yourself  0 0  0 0  Trouble concentrating 0 1  1 1   Moving slowly or fidgety/restless 0 1  0 3  Suicidal thoughts 0 0  0 0  PHQ-9 Score 11   8 10   Difficult doing work/chores Somewhat difficult Very difficult  Somewhat difficult Very difficult        04/29/2023    9:45 AM  Fall Risk   Falls in the past year? 0  Number falls in past yr: 0  Injury with Fall? 0  Follow up Falls evaluation completed    Patient Care Team: Blane Ohara, MD as PCP - General (Family Medicine) Regan Lemming, MD as PCP - Electrophysiology (Cardiology) Blane Ohara, MD as Referring Physician (Family Medicine) Drema Dallas, DO as Consulting Physician (Neurology)   Review of Systems  Constitutional:  Negative for chills, fatigue and fever.  HENT:  Negative for congestion, ear pain, rhinorrhea and sore throat.   Respiratory:  Negative for cough and shortness of breath.   Cardiovascular:  Negative for chest pain.  Gastrointestinal:  Negative for abdominal pain, constipation, diarrhea, nausea and vomiting.  Genitourinary:  Negative for dysuria and urgency.  Musculoskeletal:  Negative for back pain and  myalgias.  Neurological:  Negative for dizziness, weakness, light-headedness and headaches.  Psychiatric/Behavioral:  Positive for dysphoric mood (Grieving). The patient is not nervous/anxious.     Current Outpatient Medications on File Prior to Visit  Medication Sig Dispense Refill   aspirin 81 MG chewable tablet Chew 81 mg by mouth daily.     Budeson-Glycopyrrol-Formoterol (BREZTRI AEROSPHERE) 160-9-4.8 MCG/ACT AERO Inhale 2 puffs into the lungs 2 (two) times daily. 3 each 3   escitalopram (LEXAPRO) 20 MG tablet TAKE 1 TABLET(20 MG) BY MOUTH DAILY 90 tablet 0   LORazepam (ATIVAN) 0.5 MG tablet Take 1 tablet (0.5 mg total) by mouth 2 (two) times daily as needed for anxiety. 60 tablet 2   ondansetron (ZOFRAN) 4 MG tablet Take 1 tablet (4 mg total) by mouth every 8 (eight) hours as needed for nausea or vomiting. 30 tablet 1   rosuvastatin (CRESTOR) 20 MG tablet Take 1 tablet (20 mg total) by mouth daily. 90 tablet 1   No current facility-administered medications on file prior to visit.   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   Cardiac murmur 09/18/2020   Carotid atherosclerosis 02/26/2021   Cerebral  aneurysm 04/07/2021   Chest pain of uncertain etiology 09/18/2020   Claudication (HCC) 09/12/2020   COPD (chronic obstructive pulmonary disease) (HCC)    Depression    Depression    Dyspnea    Ex-smoker 09/18/2020   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   Hyperlipidemia    Palpitations 09/18/2020   Peripheral vascular disease (HCC)    Presence of permanent cardiac pacemaker 11/20/2020   Screening mammogram for breast cancer 09/12/2020   Stroke (HCC)    Syncope 09/12/2020   Past Surgical History:  Procedure Laterality Date   CATARACT EXTRACTION BILATERAL W/ ANTERIOR VITRECTOMY Bilateral 11/2021   IR ANGIO INTRA EXTRACRAN SEL COM CAROTID INNOMINATE BILAT MOD SED  04/07/2021   IR ANGIO INTRA EXTRACRAN  SEL COM CAROTID INNOMINATE BILAT MOD SED  06/04/2022   IR ANGIO INTRA EXTRACRAN SEL INTERNAL CAROTID UNI R MOD SED  04/21/2021   IR ANGIO VERTEBRAL SEL SUBCLAVIAN INNOMINATE BILAT MOD SED  04/07/2021   IR ANGIO VERTEBRAL SEL VERTEBRAL BILAT MOD SED  06/04/2022   IR ANGIOGRAM FOLLOW UP STUDY  04/21/2021   IR ANGIOGRAM FOLLOW UP STUDY  04/21/2021   IR CT HEAD LTD  04/21/2021   IR RADIOLOGIST EVAL & MGMT  03/12/2021   IR RADIOLOGIST EVAL & MGMT  05/07/2021   IR TRANSCATH/EMBOLIZ  04/21/2021   NO PAST SURGERIES     PACEMAKER IMPLANT N/A 10/21/2020   Procedure: PACEMAKER IMPLANT;  Surgeon: Marinus Maw, MD;  [facility];  Service: Cardiovascular;  Laterality: N/A;   RADIOLOGY WITH ANESTHESIA N/A 04/07/2021   Procedure: EMBOLIZATION;  Surgeon: Julieanne Cotton, MD;  Location: [facility];  Service: Radiology;  Laterality: N/A;   RADIOLOGY WITH ANESTHESIA N/A 04/21/2021   Procedure: RADIOLOGY WITH ANESTHESIA EMBOLIZATION;  Surgeon: Julieanne Cotton, MD;  Location: [facility];  Service: Radiology;  Laterality: N/A;   TUBAL LIGATION      Family History  Problem Relation Age of Onset   Hypertension Mother    Diabetes Mother    Heart attack Father    Stroke Father    Emphysema Sister    Stroke Brother    Cancer Daughter        Breast (Braca 2 gene)   Breast cancer Daughter    Social History   Socioeconomic History   Marital status: Single    Spouse name: Not on file   Number of children: 4   Years of education: Not on file   Highest education level: 8th grade  Occupational History   Not on file  Tobacco Use   Smoking status: Every Day    Current packs/day: 0.50    Average packs/day: 1 pack/day for 41.2 years (41.1 ttl pk-yrs)    Types: Cigarettes    Start date: 09/01/1979    Last attempt to quit: 08/31/2020   Smokeless tobacco: Never  Vaping Use   Vaping status: Never Used  Substance and Sexual Activity   Alcohol use: Never   Drug use: Never   Sexual activity: Yes     Partners: Male    Birth control/protection: Post-menopausal  Other Topics Concern   Not on file  Social History Narrative   Not on file   Social Determinants of Health   Financial Resource Strain: Medium Risk (12/14/2022)   Overall Financial Resource Strain (CARDIA)    Difficulty of Paying Living Expenses: Somewhat hard  Food Insecurity: No Food Insecurity (12/14/2022)   Hunger Vital Sign    Worried About Running Out of  Food in the Last Year: Never true    Ran Out of Food in the Last Year: Never true  Transportation Needs: No Transportation Needs (12/14/2022)   PRAPARE - Administrator, Civil Service (Medical): No    Lack of Transportation (Non-Medical): No  Physical Activity: Sufficiently Active (12/14/2022)   Exercise Vital Sign    Days of Exercise per Week: 6 days    Minutes of Exercise per Session: 30 min  Stress: Stress Concern Present (12/14/2022)   [employer] of Occupational Health - Occupational Stress Questionnaire    Feeling of Stress : Rather much  Social Connections: Moderately Isolated (04/06/2023)   Social Connection and Isolation Panel [NHANES]    Frequency of Communication with Friends and Family: Three times a week    Frequency of Social Gatherings with Friends and Family: Three times a week    Attends Religious Services: 1 to 4 times per year    Active Member of Clubs or Organizations: No    Attends Banker Meetings: Never    Marital Status: Divorced    Objective:  BP 124/78   Pulse 85   Temp (!) 97.2 F (36.2 C)   Ht 5\' 3"  (1.6 m)   Wt 99 lb (44.9 kg)   SpO2 98%   BMI 17.54 kg/m      05/04/2023    3:32 PM 04/29/2023    9:59 AM 04/06/2023    8:58 AM  BP/Weight  Systolic BP 124 112 94  Diastolic BP 78 63 62  Wt. (Lbs) 99 99 94  BMI 17.54 kg/m2 17.54 kg/m2 16.14 kg/m2    Physical Exam Vitals reviewed.  Constitutional:      Appearance: Normal appearance.     Comments: thin  Neck:     Vascular: No carotid bruit.   Cardiovascular:     Rate and Rhythm: Normal rate and regular rhythm.     Heart sounds: Normal heart sounds.  Pulmonary:     Effort: Pulmonary effort is normal. No respiratory distress.     Breath sounds: Normal breath sounds.  Abdominal:     General: Abdomen is flat. Bowel sounds are normal.     Palpations: Abdomen is soft.     Tenderness: There is no abdominal tenderness.  Neurological:     Mental Status: She is alert and oriented to person, place, and time.  Psychiatric:        Behavior: Behavior normal.     Comments: crying    Diabetic Foot Exam - Simple   No data filed      Lab Results  Component Value Date   WBC 11.0 (H) 04/16/2023   HGB 14.1 04/16/2023   HCT 42.9 04/16/2023   PLT 275 04/16/2023   GLUCOSE 90 04/06/2023   CHOL 156 08/03/2022   TRIG 74 08/03/2022   HDL 53 08/03/2022   LDLCALC 89 08/03/2022   ALT 20 04/06/2023   AST 29 04/06/2023   NA 142 04/06/2023   K 4.4 04/06/2023   CL 105 04/06/2023   CREATININE 0.82 04/06/2023   BUN 15 04/06/2023   CO2 21 04/06/2023   TSH 2.010 04/06/2023   INR 1.0 06/04/2022      Assessment & Plan:    Other elevated white blood cell (WBC) count Assessment & Plan: Check cbc  Orders: -     CBC with Differential/Platelet  Grief reaction Assessment & Plan: Reassured her feelings are appropriate.    Moderate episode of recurrent major depressive disorder (  HCC) Assessment & Plan: Compounded by grief. Continue lexapro and lorazepam at current doses. Increase mirtazepine to 45 mg before bed.    Severe protein-calorie malnutrition (HCC) Assessment & Plan: The current medical regimen is helping;  continue present plan and medications. I did increase mirtazepine 45 mg once daily at night.     Psychophysiological insomnia Assessment & Plan: Increase mirtazepine to 45 mg before bed.    Other orders -     Mirtazapine; Take 1 tablet (45 mg total) by mouth at bedtime.  Dispense: 90 tablet; Refill: 0      Meds ordered this encounter  Medications   mirtazapine (REMERON) 45 MG tablet    Sig: Take 1 tablet (45 mg total) by mouth at bedtime.    Dispense:  90 tablet    Refill:  0    Orders Placed This Encounter  Procedures   CBC with Differential/Platelet     Follow-up: Return in about 3 months (around 08/03/2023) for chronic follow up.   I,Katherina A Bramblett,acting as a scribe for Blane Ohara, MD.,have documented all relevant documentation on the behalf of Blane Ohara, MD,as directed by  Blane Ohara, MD while in the presence of Blane Ohara, MD.   An After Visit Summary was printed and given to the patient.  Blane Ohara, MD [facility] [phone]

## 2023-05-04 NOTE — Assessment & Plan Note (Signed)
Compounded by grief. Continue lexapro and lorazepam at current doses. Increase mirtazepine to 45 mg before bed.

## 2023-05-04 NOTE — Assessment & Plan Note (Signed)
Check cbc 

## 2023-05-04 NOTE — Assessment & Plan Note (Signed)
The current medical regimen is helping;  continue present plan and medications. I did increase mirtazepine 45 mg once daily at night.

## 2023-05-04 NOTE — Assessment & Plan Note (Signed)
Reassured her feelings are appropriate.

## 2023-05-04 NOTE — Assessment & Plan Note (Signed)
Increase mirtazepine to 45 mg before bed.

## 2023-05-05 ENCOUNTER — Telehealth (HOSPITAL_COMMUNITY): Payer: Self-pay

## 2023-05-05 NOTE — Telephone Encounter (Signed)
-----   Message from Hoyt Koch sent at 04/21/2023  3:56 PM EDT ----- Hi all,   Dr Corliss Skains has reviewed recent imaging and recommends repeat CTA WITH ARTERIAL AND VENOUS PHASES in 6 months.   Thanks, Lannette Donath

## 2023-05-12 ENCOUNTER — Ambulatory Visit
Admission: RE | Admit: 2023-05-12 | Discharge: 2023-05-12 | Disposition: A | Discharge status: Home / Self Care | Payer: Medicaid Other | Source: Ambulatory Visit | Attending: Family Medicine | Admitting: Family Medicine

## 2023-05-12 DIAGNOSIS — Z1231 Encounter for screening mammogram for malignant neoplasm of breast: Secondary | ICD-10-CM | POA: Diagnosis not present

## 2023-06-03 ENCOUNTER — Ambulatory Visit: Payer: Medicaid Other | Attending: Cardiology | Admitting: Cardiology

## 2023-06-03 ENCOUNTER — Encounter: Payer: Self-pay | Admitting: Cardiology

## 2023-06-03 VITALS — BP 102/60 | HR 89 | Ht 63.0 in | Wt 96.0 lb

## 2023-06-03 DIAGNOSIS — I442 Atrioventricular block, complete: Secondary | ICD-10-CM

## 2023-06-03 DIAGNOSIS — E782 Mixed hyperlipidemia: Secondary | ICD-10-CM | POA: Diagnosis not present

## 2023-06-03 DIAGNOSIS — Z950 Presence of cardiac pacemaker: Secondary | ICD-10-CM

## 2023-06-03 DIAGNOSIS — F17219 Nicotine dependence, cigarettes, with unspecified nicotine-induced disorders: Secondary | ICD-10-CM | POA: Diagnosis not present

## 2023-06-03 DIAGNOSIS — I6529 Occlusion and stenosis of unspecified carotid artery: Secondary | ICD-10-CM | POA: Diagnosis not present

## 2023-06-03 DIAGNOSIS — J431 Panlobular emphysema: Secondary | ICD-10-CM | POA: Diagnosis not present

## 2023-06-03 NOTE — Progress Notes (Signed)
Cardiology Office Note:    Date:  06/03/2023   ID:  Leslie Franklin, DOB 05-24-1961, MRN [id number]  PCP:  Blane Ohara, MD  Cardiologist:  Garwin Brothers, MD   Referring MD: No ref. provider found    ASSESSMENT:    1. Complete AV block (HCC)   2. Carotid atherosclerosis, unspecified laterality   3. Presence of permanent cardiac pacemaker   4. Cigarette nicotine dependence with nicotine-induced disorder   5. Panlobular emphysema (HCC)    PLAN:    In order of problems listed above:  Primary prevention stressed with the patient.  Importance of compliance with diet medication stressed and patient verbalized standing. Carotid atherosclerosis: Secondary prevention stressed.  Importance of compliance with diet medication stressed and she vocalized understanding.  Carotid ultrasound report discussed with the patient. Cigarette smoker: I spent 5 minutes with the patient discussing solely about smoking. Smoking cessation was counseled. I suggested to the patient also different medications and pharmacological interventions. Patient is keen to try stopping on its own at this time. He will get back to me if he needs any further assistance in this matter. Permanent pacemaker: Electrophysiology notes reviewed and discussed with the patient. COPD: Managed by primary care.  Smoking cessation counseled.  She is advised to walk to the best of her ability on a regular basis. Patient will be seen in follow-up appointment in 6 months or earlier if the patient has any concerns.    Medication Adjustments/Labs and Tests Ordered: Current medicines are reviewed at length with the patient today.  Concerns regarding medicines are outlined above.  Orders Placed This Encounter  Procedures   EKG 12-Lead   No orders of the defined types were placed in this encounter.    Chief Complaint  Patient presents with   Follow-up     History of Present Illness:    Leslie Franklin is a 62 y.o. female.  Patient has  past medical history of complete heart block post permanent pacemaker, dyslipidemia, cerebral atherosclerosis and unfortunately she continues to smoke.  She lost her daughter to cancer few weeks ago.  She is grieving but seems to be handling it well.  She is leading a sedentary lifestyle in the past several weeks.  No chest pain orthopnea or PND.  At the time of my evaluation, the patient is alert awake oriented and in no distress.  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   Hyperlipidemia    Palpitations 09/18/2020   Peripheral vascular disease (HCC)    Presence of permanent cardiac pacemaker 11/20/2020   Screening mammogram for breast cancer 09/12/2020   Stroke (HCC)    Syncope 09/12/2020    Past Surgical History:  Procedure Laterality Date   CATARACT EXTRACTION BILATERAL W/ ANTERIOR VITRECTOMY Bilateral 11/2021   IR ANGIO INTRA EXTRACRAN SEL COM CAROTID INNOMINATE BILAT MOD SED  04/07/2021   IR ANGIO INTRA EXTRACRAN SEL COM CAROTID INNOMINATE BILAT MOD SED  06/04/2022   IR ANGIO INTRA EXTRACRAN SEL INTERNAL CAROTID UNI R MOD SED  04/21/2021   IR ANGIO VERTEBRAL SEL SUBCLAVIAN  INNOMINATE BILAT MOD SED  04/07/2021   IR ANGIO VERTEBRAL SEL VERTEBRAL BILAT MOD SED  06/04/2022   IR ANGIOGRAM FOLLOW UP STUDY  04/21/2021   IR ANGIOGRAM FOLLOW UP STUDY  04/21/2021   IR CT HEAD LTD  04/21/2021   IR RADIOLOGIST EVAL & MGMT  03/12/2021   IR RADIOLOGIST EVAL & MGMT  05/07/2021   IR TRANSCATH/EMBOLIZ  04/21/2021   NO PAST SURGERIES     PACEMAKER IMPLANT N/A 10/21/2020   Procedure:  PACEMAKER IMPLANT;  Surgeon: Marinus Maw, MD;  [facility]: [facility];  Service: Cardiovascular;  Laterality: N/A;   RADIOLOGY WITH ANESTHESIA N/A 04/07/2021   Procedure: EMBOLIZATION;  Surgeon: Julieanne Cotton, MD;  Location: [facility];  Service: Radiology;  Laterality: N/A;   RADIOLOGY WITH ANESTHESIA N/A 04/21/2021   Procedure: RADIOLOGY WITH ANESTHESIA EMBOLIZATION;  Surgeon: Julieanne Cotton, MD;  Location: [facility];  Service: Radiology;  Laterality: N/A;   TUBAL LIGATION      Current Medications: Current Meds  Medication Sig   aspirin 81 MG chewable tablet Chew 81 mg by mouth daily.   Budeson-Glycopyrrol-Formoterol (BREZTRI AEROSPHERE) 160-9-4.8 MCG/ACT AERO Inhale 2 puffs into the lungs 2 (two) times daily.   escitalopram (LEXAPRO) 20 MG tablet TAKE 1 TABLET(20 MG) BY MOUTH DAILY (Patient taking differently: Take 20 mg by mouth daily.)   LORazepam (ATIVAN) 0.5 MG tablet Take 1 tablet (0.5 mg total) by mouth 2 (two) times daily as needed for anxiety.   mirtazapine (REMERON) 45 MG tablet Take 1 tablet (45 mg total) by mouth at bedtime.   ondansetron (ZOFRAN) 4 MG tablet Take 1 tablet (4 mg total) by mouth every 8 (eight) hours as needed for nausea or vomiting.   rosuvastatin (CRESTOR) 20 MG tablet Take 1 tablet (20 mg total) by mouth daily.     Allergies:   Buspirone   Social History   Socioeconomic History   Marital status: Single    Spouse name: Not on file   Number of children: 4   Years of education: Not on file   Highest education level: 8th grade  Occupational History   Not on file  Tobacco Use   Smoking status: Every Day    Current packs/day: 0.50    Average packs/day: 1 pack/day for 41.3 years (41.1 ttl pk-yrs)    Types: Cigarettes    Start date: 09/01/1979    Last attempt to quit: 08/31/2020   Smokeless tobacco: Never  Vaping Use   Vaping status: Never Used  Substance and Sexual Activity   Alcohol use: Never   Drug use: Never   Sexual activity: Yes     Partners: Male    Birth control/protection: Post-menopausal  Other Topics Concern   Not on file  Social History Narrative   Not on file   Social Determinants of Health   Financial Resource Strain: Medium Risk (12/14/2022)   Overall Financial Resource Strain (CARDIA)    Difficulty of Paying Living Expenses: Somewhat hard  Food Insecurity: No Food Insecurity (12/14/2022)   Hunger Vital Sign    Worried About Running Out of Food in the Last Year: Never true    Ran Out of Food in the Last Year: Never true  Transportation Needs: No Transportation Needs (12/14/2022)   PRAPARE - Administrator, Civil Service (Medical): No    Lack of Transportation (Non-Medical): No  Physical Activity: Sufficiently Active (12/14/2022)   Exercise Vital Sign    Days of Exercise per Week: 6 days  Minutes of Exercise per Session: 30 min  Stress: Stress Concern Present (12/14/2022)   [employer] of Occupational Health - Occupational Stress Questionnaire    Feeling of Stress : Rather much  Social Connections: Moderately Isolated (04/06/2023)   Social Connection and Isolation Panel [NHANES]    Frequency of Communication with Friends and Family: Three times a week    Frequency of Social Gatherings with Friends and Family: Three times a week    Attends Religious Services: 1 to 4 times per year    Active Member of Clubs or Organizations: No    Attends Banker Meetings: Never    Marital Status: Divorced     Family History: The patient's family history includes Breast cancer in her daughter; Cancer in her daughter; Diabetes in her mother; Emphysema in her sister; Heart attack in her father; Hypertension in her mother; Stroke in her brother and father.  ROS:   Please see the history of present illness.    All other systems reviewed and are negative.  EKGs/Labs/Other Studies Reviewed:    The following studies were reviewed today: .Marland KitchenEKG Interpretation Date/Time:  Thursday June 03 2023 14:28:06 EDT Ventricular Rate:  89 PR Interval:  152 QRS Duration:  76 QT Interval:  370 QTC Calculation: 450 R Axis:   114  Text Interpretation: Normal sinus rhythm Anterolateral infarct (cited on or before 21-Oct-2020) Abnormal ECG When compared with ECG of 21-Oct-2020 15:29, PR interval has decreased QRS axis Shifted right Questionable change in initial forces of Lateral leads Confirmed by Belva Crome [phone]) on 06/03/2023 2:32:41 PM     Recent Labs: 04/06/2023: ALT 20; BUN 15; Creatinine, Ser 0.82; Potassium 4.4; Sodium 142; TSH 2.010 05/04/2023: Hemoglobin 14.3; Platelets 256  Recent Lipid Panel    Component Value Date/Time   CHOL 156 08/03/2022 0757   TRIG 74 08/03/2022 0757   HDL 53 08/03/2022 0757   CHOLHDL 2.9 08/03/2022 0757   LDLCALC 89 08/03/2022 0757    Physical Exam:    VS:  BP 102/60 (BP Location: Left Arm, Patient Position: Sitting)   Pulse 89   Ht 5\' 3"  (1.6 m)   Wt 96 lb (43.5 kg)   SpO2 94%   BMI 17.01 kg/m     Wt Readings from Last 3 Encounters:  06/03/23 96 lb (43.5 kg)  05/04/23 99 lb (44.9 kg)  04/29/23 99 lb (44.9 kg)     GEN: Patient is in no acute distress HEENT: Normal NECK: No JVD; No carotid bruits LYMPHATICS: No lymphadenopathy CARDIAC: Hear sounds regular, 2/6 systolic murmur at the apex. RESPIRATORY:  Clear to auscultation without rales, wheezing or rhonchi  ABDOMEN: Soft, non-tender, non-distended MUSCULOSKELETAL:  No edema; No deformity  SKIN: Warm and dry NEUROLOGIC:  Alert and oriented x 3 PSYCHIATRIC:  Normal affect   Signed, Garwin Brothers, MD  06/03/2023 2:49 PM    [facility]

## 2023-06-03 NOTE — Patient Instructions (Signed)

## 2023-07-12 ENCOUNTER — Ambulatory Visit: Payer: Medicaid Other | Attending: Cardiology | Admitting: Cardiology

## 2023-07-12 NOTE — Progress Notes (Deleted)
  Electrophysiology Office Note:   Date:  07/12/2023  ID:  Leslie Franklin, DOB Oct 31, 1960, MRN [id number]  Primary Cardiologist: None Electrophysiologist: Annetta Deiss [name], MD  {Click to update primary MD,subspecialty MD or APP then REFRESH:1}    History of Present Illness:   Leslie Franklin is a 62 y.o. female with h/o stroke, heart block, syncope seen today for routine electrophysiology followup.   Since last being seen in our clinic the patient reports doing ***.  she denies chest pain, palpitations, dyspnea, PND, orthopnea, nausea, vomiting, dizziness, syncope, edema, weight gain, or early satiety.   Review of systems complete and found to be negative unless listed in HPI.      EP Information / Studies Reviewed:    {EKGtoday:28818}      PPM Interrogation-  reviewed in detail today,  See PACEART report.  Device History: Biotronik Dual Chamber PPM implanted 10/21/2020 for CHB  Risk Assessment/Calculations:   {Does this patient have ATRIAL FIBRILLATION?:[id number]} No BP recorded.  {Refresh Note OR Click here to enter BP  :1}***        Physical Exam:   VS:  There were no vitals taken for this visit.   Wt Readings from Last 3 Encounters:  06/03/23 96 lb (43.5 kg)  05/04/23 99 lb (44.9 kg)  04/29/23 99 lb (44.9 kg)     GEN: Well nourished, well developed in no acute distress NECK: No JVD; No carotid bruits CARDIAC: {EPRHYTHM:28826}, no murmurs, rubs, gallops RESPIRATORY:  Clear to auscultation without rales, wheezing or rhonchi  ABDOMEN: Soft, non-tender, non-distended EXTREMITIES:  No edema; No deformity   ASSESSMENT AND PLAN:    CHB s/p Biotronik PPM  Normal PPM function See Pace Art report No changes today  2.  Atrial tachycardia: Has had seconds to minutes.  Currently watchful waiting.  Disposition:   Follow up with {EPPROVIDERS:28135} {EPFOLLOW WU:98119}  Signed, Taysean Wager [name], MD

## 2023-07-16 NOTE — Progress Notes (Deleted)
  Electrophysiology Office Note:   Date:  07/16/2023  ID:  Naimah Penner, DOB 06/01/1961, MRN [id number]  Primary Cardiologist: None Electrophysiologist: Will [name], MD  {Click to update primary MD,subspecialty MD or APP then REFRESH:1}    History of Present Illness:   Leslie Franklin is a 62 y.o. female with h/o stroke, heart block, syncope seen today for routine electrophysiology followup.   Since last being seen in our clinic the patient reports doing ***.  she denies chest pain, palpitations, dyspnea, PND, orthopnea, nausea, vomiting, dizziness, syncope, edema, weight gain, or early satiety.   Review of systems complete and found to be negative unless listed in HPI.      EP Information / Studies Reviewed:    EKG is not ordered today. EKG from 06/03/2023 reviewed which showed NSR at 89 bpm      PPM Interrogation-  reviewed in detail today,  See PACEART report.  Device History: Biotronik Dual Chamber PPM implanted 10/21/2020 for CHB  Risk Assessment/Calculations:   {Does this patient have ATRIAL FIBRILLATION?[phone]} No BP recorded.  {Refresh Note OR Click here to enter BP  :1}***        Physical Exam:   VS:  There were no vitals taken for this visit.   Wt Readings from Last 3 Encounters:  06/03/23 96 lb (43.5 kg)  05/04/23 99 lb (44.9 kg)  04/29/23 99 lb (44.9 kg)     GEN: Well nourished, well developed in no acute distress NECK: No JVD; No carotid bruits CARDIAC: {EPRHYTHM:28826}, no murmurs, rubs, gallops RESPIRATORY:  Clear to auscultation without rales, wheezing or rhonchi  ABDOMEN: Soft, non-tender, non-distended EXTREMITIES:  No edema; No deformity   ASSESSMENT AND PLAN:    CHB s/p Biotronik PPM  Normal PPM function See Pace Art report No changes today  Atrial tachycardia:  Short episodes, no clear symptoms.  Continue watchful waiting.   Disposition:   Follow up with {EPPROVIDERS:28135} {EPFOLLOW UP:28173}  Signed, Graciella Freer,  PA-C

## 2023-07-19 ENCOUNTER — Ambulatory Visit: Payer: Medicaid Other | Admitting: Student

## 2023-07-19 ENCOUNTER — Ambulatory Visit: Payer: Medicaid Other

## 2023-07-19 DIAGNOSIS — I442 Atrioventricular block, complete: Secondary | ICD-10-CM | POA: Diagnosis not present

## 2023-07-19 DIAGNOSIS — I4719 Other supraventricular tachycardia: Secondary | ICD-10-CM

## 2023-07-20 LAB — CUP PACEART REMOTE DEVICE CHECK
Battery Voltage: 80
Date Time Interrogation Session: 20241118105113
Implantable Lead Connection Status: 753985
Implantable Lead Connection Status: 753985
Implantable Lead Implant Date: 20220221
Implantable Lead Implant Date: 20220221
Implantable Lead Location: 753859
Implantable Lead Location: 753860
Implantable Lead Model: 377171
Implantable Lead Model: 377171
Implantable Lead Serial Number: 8000187789
Implantable Lead Serial Number: 8000197459
Implantable Pulse Generator Implant Date: 20220221
Pulse Gen Model: 407145
Pulse Gen Serial Number: 70053960

## 2023-07-22 ENCOUNTER — Other Ambulatory Visit: Payer: Self-pay | Admitting: Family Medicine

## 2023-07-22 DIAGNOSIS — E782 Mixed hyperlipidemia: Secondary | ICD-10-CM

## 2023-08-05 ENCOUNTER — Ambulatory Visit: Payer: Medicaid Other | Admitting: Family Medicine

## 2023-08-05 VITALS — BP 120/82 | HR 85 | Temp 97.5°F | Resp 12 | Ht 63.0 in | Wt 102.0 lb

## 2023-08-05 DIAGNOSIS — Z23 Encounter for immunization: Secondary | ICD-10-CM

## 2023-08-05 DIAGNOSIS — E782 Mixed hyperlipidemia: Secondary | ICD-10-CM

## 2023-08-05 DIAGNOSIS — J410 Simple chronic bronchitis: Secondary | ICD-10-CM

## 2023-08-05 DIAGNOSIS — F331 Major depressive disorder, recurrent, moderate: Secondary | ICD-10-CM | POA: Diagnosis not present

## 2023-08-05 DIAGNOSIS — E43 Unspecified severe protein-calorie malnutrition: Secondary | ICD-10-CM | POA: Diagnosis not present

## 2023-08-05 DIAGNOSIS — F411 Generalized anxiety disorder: Secondary | ICD-10-CM

## 2023-08-05 DIAGNOSIS — E559 Vitamin D deficiency, unspecified: Secondary | ICD-10-CM

## 2023-08-05 MED ORDER — MIRTAZAPINE 45 MG PO TABS
45.0000 mg | ORAL_TABLET | Freq: Every day | ORAL | 0 refills | Status: DC
Start: 2023-08-05 — End: 2023-12-28

## 2023-08-05 MED ORDER — LORAZEPAM 0.5 MG PO TABS
0.5000 mg | ORAL_TABLET | Freq: Two times a day (BID) | ORAL | 2 refills | Status: DC | PRN
Start: 2023-08-05 — End: 2024-04-12

## 2023-08-05 MED ORDER — ESCITALOPRAM OXALATE 20 MG PO TABS
20.0000 mg | ORAL_TABLET | Freq: Every day | ORAL | 1 refills | Status: DC
Start: 2023-08-05 — End: 2024-07-13

## 2023-08-05 NOTE — Progress Notes (Unsigned)
Subjective:  Patient ID: Leslie Franklin, female    DOB: 1961-01-05  Age: 62 y.o. MRN: [id number]  Chief Complaint  Patient presents with   Medical Management of Chronic Issues    HPI   The patient, with a history of hyperlipidemia, depression, anxiety, and COPD, presents for a routine follow-up. She reports a recent weight gain of six pounds, which was intentional due to previous weight loss. She attributes the weight gain to improved appetite, possibly due to one of her medications. She is currently taking Crestor for hyperlipidemia, Lexapro and lorazepam for depression and anxiety, and Breztri inhaler for COPD. She also takes mirtazapine, which was initially prescribed for weight loss but also helps with sleep and depression.  The patient reports ongoing feelings of depression and sadness, which have been present every day for the past two weeks. She attributes these feelings to the recent loss of her daughter and her father. She has lost interest in activities she used to enjoy and spends most of her time sitting at home. Despite these feelings, she is able to function during the day, keep her home clean, and interact with others. She has not sought professional counseling but has been talking to her family about her feelings.  The patient also reports occasional headaches and feeling nervous all the time. She has not made any attempts to quit smoking and is currently smoking half a pack a day, down from one pack a day in 2022.      08/05/2023    1:51 PM 04/06/2023    8:39 AM 12/14/2022   11:48 AM 12/14/2022   10:44 AM 10/05/2022    8:20 AM  Depression screen PHQ 2/9  Decreased Interest 3 2  0 1  Down, Depressed, Hopeless 2 2  0 0  PHQ - 2 Score 5 4  0 1  Altered sleeping 0 2 1  1   Tired, decreased energy 0 2 1  2   Change in appetite 0 3 3  3   Feeling bad or failure about yourself  0 0 0  0  Trouble concentrating 3 0 1  1  Moving slowly or fidgety/restless 2 0 1  0  Suicidal thoughts 0 0  0  0  PHQ-9 Score 10 11   8   Difficult doing work/chores Not difficult at all Somewhat difficult Very difficult  Somewhat difficult        04/29/2023    9:45 AM  Fall Risk   Falls in the past year? 0  Number falls in past yr: 0  Injury with Fall? 0  Follow up Falls evaluation completed    Patient Care Team: Blane Ohara, MD as PCP - General (Family Medicine) Regan Lemming, MD as PCP - Electrophysiology (Cardiology) Blane Ohara, MD as Referring Physician (Family Medicine) Drema Dallas, DO as Consulting Physician (Neurology)   Review of Systems  Constitutional:  Negative for chills, fatigue and fever.  HENT:  Negative for congestion, ear pain and sore throat.   Respiratory:  Negative for cough and shortness of breath.   Cardiovascular:  Negative for chest pain and palpitations.  Gastrointestinal:  Negative for abdominal pain, constipation, diarrhea, nausea and vomiting.  Endocrine: Negative for polydipsia, polyphagia and polyuria.  Genitourinary:  Negative for difficulty urinating and dysuria.  Musculoskeletal:  Negative for arthralgias, back pain and myalgias.  Skin:  Negative for rash.  Neurological:  Positive for headaches.  Psychiatric/Behavioral:  Positive for dysphoric mood. The patient is nervous/anxious.  Current Outpatient Medications on File Prior to Visit  Medication Sig Dispense Refill   aspirin 81 MG chewable tablet Chew 81 mg by mouth daily.     Budeson-Glycopyrrol-Formoterol (BREZTRI AEROSPHERE) 160-9-4.8 MCG/ACT AERO Inhale 2 puffs into the lungs 2 (two) times daily. 3 each 3   ondansetron (ZOFRAN) 4 MG tablet Take 1 tablet (4 mg total) by mouth every 8 (eight) hours as needed for nausea or vomiting. 30 tablet 1   rosuvastatin (CRESTOR) 20 MG tablet TAKE 1 TABLET(20 MG) BY MOUTH DAILY 90 tablet 1   No current facility-administered medications on file prior to visit.   Past Medical History:  Diagnosis Date   Aneurysm (HCC) 04/21/2021   Anxiety  09/13/2020   Brain aneurysm 04/21/2021   Cardiac murmur 09/18/2020   Carotid atherosclerosis 02/26/2021   Cerebral aneurysm 04/07/2021   Chest pain of uncertain etiology 09/18/2020   Claudication (HCC) 09/12/2020   COPD (chronic obstructive pulmonary disease) (HCC)    Depression    Depression    Dyspnea    Ex-smoker 09/18/2020   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   Hyperlipidemia    Palpitations 09/18/2020   Peripheral vascular disease (HCC)    Presence of permanent cardiac pacemaker 11/20/2020   Screening mammogram for breast cancer 09/12/2020   Stroke (HCC)    Syncope 09/12/2020   Past Surgical History:  Procedure Laterality Date   CATARACT EXTRACTION BILATERAL W/ ANTERIOR VITRECTOMY Bilateral 11/2021   IR ANGIO INTRA EXTRACRAN SEL COM CAROTID INNOMINATE BILAT MOD SED  04/07/2021   IR ANGIO INTRA EXTRACRAN SEL COM CAROTID INNOMINATE BILAT MOD SED  06/04/2022   IR ANGIO INTRA EXTRACRAN SEL INTERNAL CAROTID UNI R MOD SED  04/21/2021   IR ANGIO VERTEBRAL SEL SUBCLAVIAN INNOMINATE BILAT MOD SED  04/07/2021   IR ANGIO VERTEBRAL SEL VERTEBRAL BILAT MOD SED  06/04/2022   IR ANGIOGRAM FOLLOW UP STUDY  04/21/2021   IR ANGIOGRAM FOLLOW UP STUDY  04/21/2021   IR CT HEAD LTD  04/21/2021   IR RADIOLOGIST EVAL & MGMT  03/12/2021   IR RADIOLOGIST EVAL & MGMT  05/07/2021   IR TRANSCATH/EMBOLIZ  04/21/2021   NO PAST SURGERIES     PACEMAKER IMPLANT N/A 10/21/2020   Procedure: PACEMAKER IMPLANT;  Surgeon: Marinus Maw, MD;  Location: [facility];  Service: Cardiovascular;  Laterality: N/A;   RADIOLOGY WITH ANESTHESIA N/A 04/07/2021   Procedure: EMBOLIZATION;  Surgeon: Julieanne Cotton, MD;  Location: [facility] OR;  Service: Radiology;  Laterality: N/A;   RADIOLOGY WITH ANESTHESIA N/A 04/21/2021   Procedure: RADIOLOGY WITH ANESTHESIA EMBOLIZATION;  Surgeon: Julieanne Cotton, MD;  Location: [facility] OR;  Service: Radiology;  Laterality: N/A;   TUBAL LIGATION      Family History   Problem Relation Age of Onset   Hypertension Mother    Diabetes Mother    Heart attack Father    Stroke Father    Emphysema Sister    Stroke Brother    Cancer Daughter        Breast (Braca 2 gene)   Breast cancer Daughter    Social History   Socioeconomic History   Marital status: Single    Spouse name: Not on file   Number of children: 4   Years of education: Not on file   Highest education level: 8th grade  Occupational History   Not on file  Tobacco Use   Smoking status: Every Day    Current packs/day: 0.50    Average packs/day: 1  pack/day for 41.4 years (41.2 ttl pk-yrs)    Types: Cigarettes    Start date: 09/01/1979    Last attempt to quit: 08/31/2020   Smokeless tobacco: Never  Vaping Use   Vaping status: Never Used  Substance and Sexual Activity   Alcohol use: Never   Drug use: Never   Sexual activity: Not Currently    Partners: Male    Birth control/protection: Post-menopausal  Other Topics Concern   Not on file  Social History Narrative   Not on file   Social Determinants of Health   Financial Resource Strain: Low Risk  (08/05/2023)   Overall Financial Resource Strain (CARDIA)    Difficulty of Paying Living Expenses: Not very hard  Food Insecurity: No Food Insecurity (08/05/2023)   Hunger Vital Sign    Worried About Running Out of Food in the Last Year: Never true    Ran Out of Food in the Last Year: Never true  Transportation Needs: No Transportation Needs (08/05/2023)   PRAPARE - Administrator, Civil Service (Medical): No    Lack of Transportation (Non-Medical): No  Physical Activity: Sufficiently Active (08/05/2023)   Exercise Vital Sign    Days of Exercise per Week: 7 days    Minutes of Exercise per Session: 30 min  Stress: Stress Concern Present (08/05/2023)   [employer] of Occupational Health - Occupational Stress Questionnaire    Feeling of Stress : Very much  Social Connections: Socially Isolated (08/05/2023)   Social  Connection and Isolation Panel [NHANES]    Frequency of Communication with Friends and Family: Once a week    Frequency of Social Gatherings with Friends and Family: Never    Attends Religious Services: Never    Diplomatic Services operational officer: No    Attends Engineer, structural: Never    Marital Status: Divorced    Objective:  BP 120/82   Pulse 85   Temp (!) 97.5 F (36.4 C)   Resp 12   Ht 5\' 3"  (1.6 m)   Wt 102 lb (46.3 kg)   SpO2 97%   BMI 18.07 kg/m      08/07/2023    9:43 AM 08/05/2023    1:26 PM 06/03/2023    2:28 PM  BP/Weight  Systolic BP 154 120 102  Diastolic BP 82 82 60  Wt. (Lbs)  102 96  BMI  18.07 kg/m2 17.01 kg/m2    Physical Exam Vitals reviewed.  Constitutional:      Appearance: Normal appearance.     Comments: thin  Neck:     Vascular: No carotid bruit.  Cardiovascular:     Rate and Rhythm: Normal rate and regular rhythm.     Heart sounds: Murmur heard.  Pulmonary:     Effort: Pulmonary effort is normal. No respiratory distress.     Breath sounds: Normal breath sounds.  Abdominal:     General: Abdomen is flat. Bowel sounds are normal.     Palpations: Abdomen is soft.     Tenderness: There is no abdominal tenderness.  Neurological:     Mental Status: She is alert and oriented to person, place, and time.  Psychiatric:        Behavior: Behavior normal.     Comments: tearful     Diabetic Foot Exam - Simple   No data filed      Lab Results  Component Value Date   WBC 10.7 08/05/2023   HGB 13.9 08/05/2023   HCT  43.2 08/05/2023   PLT 274 08/05/2023   GLUCOSE 97 08/06/2023   CHOL 201 (H) 08/05/2023   TRIG 80 08/05/2023   HDL 74 08/05/2023   LDLCALC 113 (H) 08/05/2023   ALT 26 08/06/2023   AST 32 08/06/2023   NA 145 (H) 08/06/2023   K 6.1 (HH) 08/06/2023   CL 107 (H) 08/06/2023   CREATININE 0.93 08/06/2023   BUN 19 08/06/2023   CO2 25 08/06/2023   TSH 2.010 04/06/2023   INR 1.0 06/04/2022      Assessment &  Plan:     General Health Maintenance -Administer influenza vaccine today. -Consider shingles vaccine if covered by patient's insurance. -Schedule annual low-dose CT for lung cancer screening. -Order labs for cholesterol, liver and kidney function, and complete blood count.  Meds ordered this encounter  Medications   mirtazapine (REMERON) 45 MG tablet    Sig: Take 1 tablet (45 mg total) by mouth at bedtime.    Dispense:  90 tablet    Refill:  0   escitalopram (LEXAPRO) 20 MG tablet    Sig: Take 1 tablet (20 mg total) by mouth daily.    Dispense:  90 tablet    Refill:  1   LORazepam (ATIVAN) 0.5 MG tablet    Sig: Take 1 tablet (0.5 mg total) by mouth 2 (two) times daily as needed for anxiety.    Dispense:  60 tablet    Refill:  2    Orders Placed This Encounter  Procedures   Influenza, MDCK, trivalent, PF(Flucelvax egg-free)   CBC with Differential/Platelet   Lipid panel   VITAMIN D 25 Hydroxy (Vit-D Deficiency, Fractures)   Comprehensive metabolic panel     Follow-up: Return in about 3 months (around 11/03/2023) for chronic follow up.   I,Marla I Leal-Borjas,acting as a scribe for Blane Ohara, MD.,have documented all relevant documentation on the behalf of Blane Ohara, MD,as directed by  Blane Ohara, MD while in the presence of Blane Ohara, MD.   An After Visit Summary was printed and given to the patient.  I attest that I have reviewed this visit and agree with the plan scribed by my staff.   Blane Ohara, MD [facility] [phone]

## 2023-08-06 ENCOUNTER — Other Ambulatory Visit: Payer: Self-pay

## 2023-08-06 ENCOUNTER — Ambulatory Visit: Payer: Medicaid Other

## 2023-08-06 DIAGNOSIS — E875 Hyperkalemia: Secondary | ICD-10-CM

## 2023-08-06 LAB — CBC WITH DIFFERENTIAL/PLATELET
Basophils Absolute: 0.1 10*3/uL (ref 0.0–0.2)
Basos: 1 %
EOS (ABSOLUTE): 0.3 10*3/uL (ref 0.0–0.4)
Eos: 3 %
Hematocrit: 43.2 % (ref 34.0–46.6)
Hemoglobin: 13.9 g/dL (ref 11.1–15.9)
Immature Grans (Abs): 0 10*3/uL (ref 0.0–0.1)
Immature Granulocytes: 0 %
Lymphocytes Absolute: 3 10*3/uL (ref 0.7–3.1)
Lymphs: 29 %
MCH: 30 pg (ref 26.6–33.0)
MCHC: 32.2 g/dL (ref 31.5–35.7)
MCV: 93 fL (ref 79–97)
Monocytes Absolute: 0.6 10*3/uL (ref 0.1–0.9)
Monocytes: 6 %
Neutrophils Absolute: 6.6 10*3/uL (ref 1.4–7.0)
Neutrophils: 61 %
Platelets: 274 10*3/uL (ref 150–450)
RBC: 4.64 x10E6/uL (ref 3.77–5.28)
RDW: 11.9 % (ref 11.7–15.4)
WBC: 10.7 10*3/uL (ref 3.4–10.8)

## 2023-08-06 LAB — COMPREHENSIVE METABOLIC PANEL
ALT: 23 [IU]/L (ref 0–32)
AST: 30 [IU]/L (ref 0–40)
Albumin: 4.3 g/dL (ref 3.8–4.9)
Alkaline Phosphatase: 97 [IU]/L (ref 44–121)
BUN/Creatinine Ratio: 18 (ref 12–28)
BUN: 17 mg/dL (ref 8–27)
Bilirubin Total: 0.2 mg/dL (ref 0.0–1.2)
CO2: 22 mmol/L (ref 20–29)
Calcium: 9.5 mg/dL (ref 8.7–10.3)
Chloride: 105 mmol/L (ref 96–106)
Creatinine, Ser: 0.93 mg/dL (ref 0.57–1.00)
Globulin, Total: 2.2 g/dL (ref 1.5–4.5)
Glucose: 76 mg/dL (ref 70–99)
Potassium: 5.7 mmol/L — ABNORMAL HIGH (ref 3.5–5.2)
Sodium: 141 mmol/L (ref 134–144)
Total Protein: 6.5 g/dL (ref 6.0–8.5)
eGFR: 70 mL/min/{1.73_m2} (ref >59)

## 2023-08-06 LAB — LIPID PANEL
Chol/HDL Ratio: 2.7 {ratio} (ref 0.0–4.4)
Cholesterol, Total: 201 mg/dL — ABNORMAL HIGH (ref 100–199)
HDL: 74 mg/dL (ref >39)
LDL Chol Calc (NIH): 113 mg/dL — ABNORMAL HIGH (ref 0–99)
Triglycerides: 80 mg/dL (ref 0–149)
VLDL Cholesterol Cal: 14 mg/dL (ref 5–40)

## 2023-08-06 LAB — VITAMIN D 25 HYDROXY (VIT D DEFICIENCY, FRACTURES): Vit D, 25-Hydroxy: 33.2 ng/mL (ref 30.0–100.0)

## 2023-08-07 ENCOUNTER — Other Ambulatory Visit: Payer: Self-pay

## 2023-08-07 ENCOUNTER — Ambulatory Visit (HOSPITAL_BASED_OUTPATIENT_CLINIC_OR_DEPARTMENT_OTHER)
Admission: EM | Admit: 2023-08-07 | Discharge: 2023-08-07 | Disposition: A | Discharge status: Home / Self Care | Payer: Medicaid Other | Attending: Internal Medicine | Admitting: Internal Medicine

## 2023-08-07 ENCOUNTER — Encounter (HOSPITAL_BASED_OUTPATIENT_CLINIC_OR_DEPARTMENT_OTHER): Payer: Self-pay

## 2023-08-07 DIAGNOSIS — I4519 Other right bundle-branch block: Secondary | ICD-10-CM | POA: Diagnosis not present

## 2023-08-07 DIAGNOSIS — E875 Hyperkalemia: Secondary | ICD-10-CM

## 2023-08-07 DIAGNOSIS — F17210 Nicotine dependence, cigarettes, uncomplicated: Secondary | ICD-10-CM | POA: Diagnosis not present

## 2023-08-07 DIAGNOSIS — J449 Chronic obstructive pulmonary disease, unspecified: Secondary | ICD-10-CM | POA: Diagnosis not present

## 2023-08-07 DIAGNOSIS — I499 Cardiac arrhythmia, unspecified: Secondary | ICD-10-CM | POA: Diagnosis not present

## 2023-08-07 LAB — CMP14+EGFR
ALT: 26 [IU]/L (ref 0–32)
AST: 32 [IU]/L (ref 0–40)
Albumin: 4.5 g/dL (ref 3.8–4.9)
Alkaline Phosphatase: 104 [IU]/L (ref 44–121)
BUN/Creatinine Ratio: 20 (ref 12–28)
BUN: 19 mg/dL (ref 8–27)
Bilirubin Total: 0.3 mg/dL (ref 0.0–1.2)
CO2: 25 mmol/L (ref 20–29)
Calcium: 9.7 mg/dL (ref 8.7–10.3)
Chloride: 107 mmol/L — ABNORMAL HIGH (ref 96–106)
Creatinine, Ser: 0.93 mg/dL (ref 0.57–1.00)
Globulin, Total: 2.2 g/dL (ref 1.5–4.5)
Glucose: 97 mg/dL (ref 70–99)
Potassium: 6.1 mmol/L (ref 3.5–5.2)
Sodium: 145 mmol/L — ABNORMAL HIGH (ref 134–144)
Total Protein: 6.7 g/dL (ref 6.0–8.5)
eGFR: 70 mL/min/{1.73_m2} (ref >59)

## 2023-08-07 NOTE — Assessment & Plan Note (Signed)
The current medical regimen is effective;  continue present plan and medications.  Ativan 0.5 mg three times a day as needed and Lexapro 20 mg daily.

## 2023-08-07 NOTE — Assessment & Plan Note (Signed)
Well controlled.  No changes to medicines. Rosuvastatin 20 mg daily Continue to work on eating a healthy diet and exercise.  Labs drawn today.   

## 2023-08-07 NOTE — Assessment & Plan Note (Signed)
Fair control. Continue lexapro and lorazepam at current doses and mirtazepine to 45 mg before bed.  Recommend counseling

## 2023-08-07 NOTE — Assessment & Plan Note (Signed)
Check vitamin D. 

## 2023-08-07 NOTE — ED Triage Notes (Signed)
Patient had labs drawn twice recently with potassium levels elevated. Denies chest pain, denies shortness of breath. 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

## 2023-08-07 NOTE — ED Provider Notes (Signed)
Patient presents to urgent care for chief complaint of high potassium. She was seen by her PCP on 08/05/23 where labs were drawn and she was found to have potassium of 5.7 without any other electrolyte imbalances.  Labs re-drawn yesterday and potassium trending upwards to 6.1, sodium 145.  Denies chest pain, shortness of breath, palpitations, dizziness, leg swelling, orthopnea, and dizziness.  History of PVD, COPD, claudication, cardiac murmur, palpitations, and stroke. Pacemaker present. EKG shows atrial paced rhythm without ST/T wave changes, EKG appears similar to previous on file.    Patient would benefit from ER workup given confirmed hyperkalemia. Discussed recommendations and risks of deferring ED visit with patient who expresses understanding and agreement with plan. Discharged from urgent care in stable condition to ED via private vehicle.    Carlisle Beers, [place] 08/07/23 1013

## 2023-08-08 NOTE — Assessment & Plan Note (Signed)
The current medical regimen is effective;  continue present plan and medications.  Continue Breztri.  Recommend quit smoking.

## 2023-08-08 NOTE — Assessment & Plan Note (Signed)
Continue mirtazepine. Continue to eat 3 meals per day.

## 2023-08-11 NOTE — Progress Notes (Signed)
Remote pacemaker transmission.   

## 2023-08-16 ENCOUNTER — Telehealth: Payer: Self-pay | Admitting: Family Medicine

## 2023-08-16 NOTE — Telephone Encounter (Signed)
   Leslie Franklin has been scheduled for the following appointment:  WHAT: CT LUNG SCREEN WHERE: [facility] DATE: 08/30/2023 TIME: 11:00 AM CHECK-IN  Patient has been made aware.

## 2023-08-16 NOTE — Telephone Encounter (Signed)
Patient called to ask for an appointment to have labs done per hospital. Patient was advised that someone will be calling her for a hospital follow-up.

## 2023-08-18 NOTE — Progress Notes (Unsigned)
Subjective:  Patient ID: Leslie Franklin, female    DOB: [birth year]  Age: 62 y.o. MRN: [id number]  Chief Complaint  Patient presents with   Hospitalization Follow-up    HPI   Patient presents for hospital follow up seen at [facility], 08/07/2023 for Hyperkalemia. Patient was seen at the ED and given albuterol nebulizers, Insulin and D50. Repeat potassium 3.6. Improved. Here for recheck potassium.      08/05/2023    1:51 PM 04/06/2023    8:39 AM 12/14/2022   11:48 AM 12/14/2022   10:44 AM 10/05/2022    8:20 AM  Depression screen PHQ 2/9  Decreased Interest 3 2  0 1  Down, Depressed, Hopeless 2 2  0 0  PHQ - 2 Score 5 4  0 1  Altered sleeping 0 2 1  1   Tired, decreased energy 0 2 1  2   Change in appetite 0 3 3  3   Feeling bad or failure about yourself  0 0 0  0  Trouble concentrating 3 0 1  1  Moving slowly or fidgety/restless 2 0 1  0  Suicidal thoughts 0 0 0  0  PHQ-9 Score 10 11   8   Difficult doing work/chores Not difficult at all Somewhat difficult Very difficult  Somewhat difficult        04/29/2023    9:45 AM  Fall Risk   Falls in the past year? 0  Number falls in past yr: 0  Injury with Fall? 0  Follow up Falls evaluation completed    Patient Care Team: Blane Ohara, MD as PCP - General (Family Medicine) Regan Lemming, MD as PCP - Electrophysiology (Cardiology) Blane Ohara, MD as Referring Physician (Family Medicine) Drema Dallas, DO as Consulting Physician (Neurology)   Review of Systems  Constitutional:  Negative for chills, fatigue and fever.  HENT:  Negative for congestion, ear pain, rhinorrhea and sore throat.   Respiratory:  Negative for cough and shortness of breath.   Cardiovascular:  Negative for chest pain.  Gastrointestinal:  Negative for abdominal pain, constipation, diarrhea, nausea and vomiting.  Genitourinary:  Negative for dysuria and urgency.  Musculoskeletal:  Negative for back pain and myalgias.  Neurological:  Negative for  dizziness, weakness, light-headedness and headaches.  Psychiatric/Behavioral:  Negative for dysphoric mood. The patient is not nervous/anxious.     Current Outpatient Medications on File Prior to Visit  Medication Sig Dispense Refill   aspirin 81 MG chewable tablet Chew 81 mg by mouth daily.     Budeson-Glycopyrrol-Formoterol (BREZTRI AEROSPHERE) 160-9-4.8 MCG/ACT AERO Inhale 2 puffs into the lungs 2 (two) times daily. 3 each 3   escitalopram (LEXAPRO) 20 MG tablet Take 1 tablet (20 mg total) by mouth daily. 90 tablet 1   LORazepam (ATIVAN) 0.5 MG tablet Take 1 tablet (0.5 mg total) by mouth 2 (two) times daily as needed for anxiety. 60 tablet 2   mirtazapine (REMERON) 45 MG tablet Take 1 tablet (45 mg total) by mouth at bedtime. 90 tablet 0   ondansetron (ZOFRAN) 4 MG tablet Take 1 tablet (4 mg total) by mouth every 8 (eight) hours as needed for nausea or vomiting. 30 tablet 1   rosuvastatin (CRESTOR) 20 MG tablet TAKE 1 TABLET(20 MG) BY MOUTH DAILY 90 tablet 1   No current facility-administered medications on file prior to visit.   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  Cardiac murmur 09/18/2020   Carotid atherosclerosis 02/26/2021   Cerebral aneurysm 04/07/2021   Chest pain of uncertain etiology 09/18/2020   Claudication (HCC) 09/12/2020   COPD (chronic obstructive pulmonary disease) (HCC)    Depression    Depression    Dyspnea    Ex-smoker 09/18/2020   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   Hyperlipidemia    Palpitations 09/18/2020   Peripheral vascular disease (HCC)    Presence of permanent cardiac pacemaker 11/20/2020   Screening mammogram for breast cancer 09/12/2020   Stroke (HCC)    Syncope 09/12/2020   Past Surgical History:  Procedure Laterality Date   CATARACT EXTRACTION BILATERAL W/ ANTERIOR VITRECTOMY Bilateral 11/2021   IR ANGIO INTRA EXTRACRAN SEL COM CAROTID INNOMINATE BILAT  MOD SED  04/07/2021   IR ANGIO INTRA EXTRACRAN SEL COM CAROTID INNOMINATE BILAT MOD SED  06/04/2022   IR ANGIO INTRA EXTRACRAN SEL INTERNAL CAROTID UNI R MOD SED  04/21/2021   IR ANGIO VERTEBRAL SEL SUBCLAVIAN INNOMINATE BILAT MOD SED  04/07/2021   IR ANGIO VERTEBRAL SEL VERTEBRAL BILAT MOD SED  06/04/2022   IR ANGIOGRAM FOLLOW UP STUDY  04/21/2021   IR ANGIOGRAM FOLLOW UP STUDY  04/21/2021   IR CT HEAD LTD  04/21/2021   IR RADIOLOGIST EVAL & MGMT  03/12/2021   IR RADIOLOGIST EVAL & MGMT  05/07/2021   IR TRANSCATH/EMBOLIZ  04/21/2021   NO PAST SURGERIES     PACEMAKER IMPLANT N/A 10/21/2020   Procedure: PACEMAKER IMPLANT;  Surgeon: Marinus Maw, MD;  Location: [facility];  Service: Cardiovascular;  Laterality: N/A;   RADIOLOGY WITH ANESTHESIA N/A 04/07/2021   Procedure: EMBOLIZATION;  Surgeon: Julieanne Cotton, MD;  Location: [facility] OR;  Service: Radiology;  Laterality: N/A;   RADIOLOGY WITH ANESTHESIA N/A 04/21/2021   Procedure: RADIOLOGY WITH ANESTHESIA EMBOLIZATION;  Surgeon: Julieanne Cotton, MD;  Location: [facility] OR;  Service: Radiology;  Laterality: N/A;   TUBAL LIGATION      Family History  Problem Relation Age of Onset   Hypertension Mother    Diabetes Mother    Heart attack Father    Stroke Father    Emphysema Sister    Stroke Brother    Cancer Daughter        Breast (Braca 2 gene)   Breast cancer Daughter    Social History   Socioeconomic History   Marital status: Single    Spouse name: Not on file   Number of children: 4   Years of education: Not on file   Highest education level: 8th grade  Occupational History   Not on file  Tobacco Use   Smoking status: Every Day    Current packs/day: 0.50    Average packs/day: 1 pack/day for 41.5 years (41.2 ttl pk-yrs)    Types: Cigarettes    Start date: 09/01/1979    Last attempt to quit: 08/31/2020   Smokeless tobacco: Never  Vaping Use   Vaping status: Never Used  Substance and Sexual Activity   Alcohol use:  Never   Drug use: Never   Sexual activity: Not Currently    Partners: Male    Birth control/protection: Post-menopausal  Other Topics Concern   Not on file  Social History Narrative   Not on file   Social Drivers of Health   Financial Resource Strain: Low Risk  (08/05/2023)   Overall Financial Resource Strain (CARDIA)    Difficulty of Paying Living Expenses: Not very hard  Food Insecurity: No Food Insecurity (  08/05/2023)   Hunger Vital Sign    Worried About Running Out of Food in the Last Year: Never true    Ran Out of Food in the Last Year: Never true  Transportation Needs: No Transportation Needs (08/05/2023)   PRAPARE - Administrator, Civil Service (Medical): No    Lack of Transportation (Non-Medical): No  Physical Activity: Sufficiently Active (08/05/2023)   Exercise Vital Sign    Days of Exercise per Week: 7 days    Minutes of Exercise per Session: 30 min  Stress: Stress Concern Present (08/05/2023)   [employer] of Occupational Health - Occupational Stress Questionnaire    Feeling of Stress : Very much  Social Connections: Socially Isolated (08/05/2023)   Social Connection and Isolation Panel [NHANES]    Frequency of Communication with Friends and Family: Once a week    Frequency of Social Gatherings with Friends and Family: Never    Attends Religious Services: Never    Diplomatic Services operational officer: No    Attends Engineer, structural: Never    Marital Status: Divorced    Objective:  BP 128/80   Pulse 99   Temp 97.6 F (36.4 C)   Ht 5\' 3"  (1.6 m)   Wt 101 lb (45.8 kg)   SpO2 100%   BMI 17.89 kg/m      08/19/2023   10:29 AM 08/07/2023    9:43 AM 08/05/2023    1:26 PM  BP/Weight  Systolic BP 128 154 120  Diastolic BP 80 82 82  Wt. (Lbs) 101  102  BMI 17.89 kg/m2  18.07 kg/m2    Physical Exam Vitals reviewed.  Constitutional:      Appearance: Normal appearance.  Cardiovascular:     Rate and Rhythm: Normal rate and  regular rhythm.     Heart sounds: Normal heart sounds.  Pulmonary:     Effort: Pulmonary effort is normal.     Breath sounds: Normal breath sounds.  Neurological:     Mental Status: She is alert and oriented to person, place, and time.  Psychiatric:        Mood and Affect: Mood normal.        Behavior: Behavior normal.     Diabetic Foot Exam - Simple   No data filed      Lab Results  Component Value Date   WBC 10.7 08/05/2023   HGB 13.9 08/05/2023   HCT 43.2 08/05/2023   PLT 274 08/05/2023   GLUCOSE 75 08/19/2023   CHOL 201 (H) 08/05/2023   TRIG 80 08/05/2023   HDL 74 08/05/2023   LDLCALC 113 (H) 08/05/2023   ALT 28 08/19/2023   AST 39 08/19/2023   NA 145 (H) 08/19/2023   K 4.6 08/19/2023   CL 105 08/19/2023   CREATININE 0.80 08/19/2023   BUN 11 08/19/2023   CO2 22 08/19/2023   TSH 2.010 04/06/2023   INR 1.0 06/04/2022      Assessment & Plan:    Serum potassium elevated Assessment & Plan: Stable Check labs.  Orders: -     Comprehensive metabolic panel     No orders of the defined types were placed in this encounter.   Orders Placed This Encounter  Procedures   Comprehensive metabolic panel     Follow-up: No follow-ups on file.   I,Katherina A Bramblett,acting as a scribe for Blane Ohara, MD.,have documented all relevant documentation on the behalf of Blane Ohara, MD,as directed by  Fritzi Mandes  Ashtyn Meland, MD while in the presence of Blane Ohara, MD.   Clayborn Bigness I Leal-Borjas,acting as a scribe for Blane Ohara, MD.,have documented all relevant documentation on the behalf of Blane Ohara, MD,as directed by  Blane Ohara, MD while in the presence of Blane Ohara, MD.    An After Visit Summary was printed and given to the patient.  I attest that I have reviewed this visit and agree with the plan scribed by my staff.   Blane Ohara, MD [facility] [phone]

## 2023-08-19 ENCOUNTER — Ambulatory Visit (INDEPENDENT_AMBULATORY_CARE_PROVIDER_SITE_OTHER): Payer: Medicaid Other | Admitting: Family Medicine

## 2023-08-19 ENCOUNTER — Encounter: Payer: Self-pay | Admitting: Family Medicine

## 2023-08-19 VITALS — BP 128/80 | HR 99 | Temp 97.6°F | Ht 63.0 in | Wt 101.0 lb

## 2023-08-19 DIAGNOSIS — E875 Hyperkalemia: Secondary | ICD-10-CM

## 2023-08-20 LAB — COMPREHENSIVE METABOLIC PANEL
ALT: 28 [IU]/L (ref 0–32)
AST: 39 [IU]/L (ref 0–40)
Albumin: 4.2 g/dL (ref 3.8–4.9)
Alkaline Phosphatase: 97 [IU]/L (ref 44–121)
BUN/Creatinine Ratio: 14 (ref 12–28)
BUN: 11 mg/dL (ref 8–27)
Bilirubin Total: 0.3 mg/dL (ref 0.0–1.2)
CO2: 22 mmol/L (ref 20–29)
Calcium: 9.2 mg/dL (ref 8.7–10.3)
Chloride: 105 mmol/L (ref 96–106)
Creatinine, Ser: 0.8 mg/dL (ref 0.57–1.00)
Globulin, Total: 2.1 g/dL (ref 1.5–4.5)
Glucose: 75 mg/dL (ref 70–99)
Potassium: 4.6 mmol/L (ref 3.5–5.2)
Sodium: 145 mmol/L — ABNORMAL HIGH (ref 134–144)
Total Protein: 6.3 g/dL (ref 6.0–8.5)
eGFR: 84 mL/min/{1.73_m2} (ref >59)

## 2023-08-21 DIAGNOSIS — E875 Hyperkalemia: Secondary | ICD-10-CM

## 2023-08-21 HISTORY — DX: Hyperkalemia: E87.5

## 2023-08-21 NOTE — Assessment & Plan Note (Signed)
Stable Check labs 

## 2023-09-22 ENCOUNTER — Encounter: Payer: Self-pay | Admitting: Neurology

## 2023-10-18 ENCOUNTER — Ambulatory Visit (INDEPENDENT_AMBULATORY_CARE_PROVIDER_SITE_OTHER): Payer: Medicaid Other

## 2023-10-18 DIAGNOSIS — I442 Atrioventricular block, complete: Secondary | ICD-10-CM

## 2023-10-19 ENCOUNTER — Telehealth (HOSPITAL_COMMUNITY): Payer: Self-pay

## 2023-10-19 ENCOUNTER — Other Ambulatory Visit (HOSPITAL_COMMUNITY): Payer: Self-pay | Admitting: Interventional Radiology

## 2023-10-19 DIAGNOSIS — I771 Stricture of artery: Secondary | ICD-10-CM

## 2023-10-19 NOTE — Telephone Encounter (Signed)
 Called to schedule cta, no answer, left vm. AB

## 2023-10-20 LAB — CUP PACEART REMOTE DEVICE CHECK
Date Time Interrogation Session: 20250217115843
Implantable Lead Connection Status: 753985
Implantable Lead Connection Status: 753985
Implantable Lead Implant Date: 20220221
Implantable Lead Implant Date: 20220221
Implantable Lead Location: 753859
Implantable Lead Location: 753860
Implantable Lead Model: 377171
Implantable Lead Model: 377171
Implantable Lead Serial Number: 8000187789
Implantable Lead Serial Number: 8000197459
Implantable Pulse Generator Implant Date: 20220221
Pulse Gen Model: 407145
Pulse Gen Serial Number: 70053960

## 2023-10-25 ENCOUNTER — Ambulatory Visit: Payer: Medicaid Other | Attending: Cardiology | Admitting: Cardiology

## 2023-10-25 ENCOUNTER — Encounter: Payer: Self-pay | Admitting: Cardiology

## 2023-10-25 VITALS — BP 120/68 | HR 80 | Ht 63.5 in | Wt 104.0 lb

## 2023-10-25 DIAGNOSIS — I442 Atrioventricular block, complete: Secondary | ICD-10-CM

## 2023-10-25 DIAGNOSIS — I495 Sick sinus syndrome: Secondary | ICD-10-CM

## 2023-10-25 LAB — CUP PACEART INCLINIC DEVICE CHECK
Date Time Interrogation Session: 20250224092036
Implantable Lead Connection Status: 753985
Implantable Lead Connection Status: 753985
Implantable Lead Implant Date: 20220221
Implantable Lead Implant Date: 20220221
Implantable Lead Location: 753859
Implantable Lead Location: 753860
Implantable Lead Model: 377171
Implantable Lead Model: 377171
Implantable Lead Serial Number: 8000187789
Implantable Lead Serial Number: 8000197459
Implantable Pulse Generator Implant Date: 20220221
Pulse Gen Model: 407145
Pulse Gen Serial Number: 70053960

## 2023-10-25 NOTE — Progress Notes (Signed)
  Electrophysiology Office Note:   Date:  10/25/2023  ID:  Leslie Franklin, DOB [birth year], MRN [id number]  Primary Cardiologist: Garwin Brothers, MD Primary Heart Failure: None Electrophysiologist: Lewayne Pauley [name], MD      History of Present Illness:   Leslie Franklin is a 63 y.o. female with h/o CVA, complete heart block, syncope seen today for routine electrophysiology followup.   Since last being seen in our clinic the patient reports doing well.  She has noted no issues since last being seen.  She has been able to do all of her daily activities without issue.  She has no acute complaints..  she denies chest pain, palpitations, dyspnea, PND, orthopnea, nausea, vomiting, dizziness, syncope, edema, weight gain, or early satiety.   Review of systems complete and found to be negative unless listed in HPI.      EP Information / Studies Reviewed:    EKG is ordered today. Personal review as below.  EKG Interpretation Date/Time:  Monday October 25 2023 09:07:07 EST Ventricular Rate:  89 PR Interval:  152 QRS Duration:  72 QT Interval:  372 QTC Calculation: 452 R Axis:   -41  Text Interpretation: Normal sinus rhythm Left axis deviation Septal infarct (cited on or before 21-Oct-2020) Inferior infarct , age undetermined Abnormal ECG When compared with ECG of 03-Jun-2023 14:28, QRS axis Shifted left Inferior infarct is now Present T wave inversion now evident in Inferior leads Confirmed by Andrius Andrepont ([id number]) on 10/25/2023 9:31:12 AM   PPM Interrogation-  reviewed in detail today,  See PACEART report.  Device History: Biotronik Dual Chamber PPM implanted 10/18/2020 for CHB  Risk Assessment/Calculations:              Physical Exam:   VS:  BP 120/68   Pulse 80   Ht 5' 3.5" (1.613 m)   Wt 104 lb (47.2 kg)   SpO2 95%   BMI 18.13 kg/m    Wt Readings from Last 3 Encounters:  10/25/23 104 lb (47.2 kg)  08/19/23 101 lb (45.8 kg)  08/05/23 102 lb (46.3 kg)     GEN: Well nourished,  well developed in no acute distress NECK: No JVD; No carotid bruits CARDIAC: Regular rate and rhythm, no murmurs, rubs, gallops RESPIRATORY:  Clear to auscultation without rales, wheezing or rhonchi  ABDOMEN: Soft, non-tender, non-distended EXTREMITIES:  No edema; No deformity   ASSESSMENT AND PLAN:    CHB s/p Biotronik PPM  Normal PPM function See Pace Art report No changes today  Disposition:   Follow up with Dr. Elberta Fortis in 12 months  Signed, Asir Bingley [name], MD

## 2023-10-28 ENCOUNTER — Ambulatory Visit: Payer: Medicaid Other | Admitting: Neurology

## 2023-11-01 ENCOUNTER — Ambulatory Visit: Payer: Medicaid Other | Admitting: Neurology

## 2023-11-03 ENCOUNTER — Ambulatory Visit (HOSPITAL_COMMUNITY)
Admission: RE | Admit: 2023-11-03 | Discharge: 2023-11-03 | Disposition: A | Discharge status: Home / Self Care | Payer: Medicaid Other | Source: Ambulatory Visit | Attending: Interventional Radiology | Admitting: Interventional Radiology

## 2023-11-03 ENCOUNTER — Ambulatory Visit (HOSPITAL_COMMUNITY): Admission: RE | Admit: 2023-11-03 | Payer: Medicaid Other | Source: Ambulatory Visit

## 2023-11-03 ENCOUNTER — Encounter (HOSPITAL_COMMUNITY): Payer: Self-pay

## 2023-11-03 DIAGNOSIS — I6521 Occlusion and stenosis of right carotid artery: Secondary | ICD-10-CM | POA: Diagnosis not present

## 2023-11-03 DIAGNOSIS — I671 Cerebral aneurysm, nonruptured: Secondary | ICD-10-CM | POA: Diagnosis not present

## 2023-11-03 DIAGNOSIS — I771 Stricture of artery: Secondary | ICD-10-CM | POA: Diagnosis not present

## 2023-11-03 DIAGNOSIS — I672 Cerebral atherosclerosis: Secondary | ICD-10-CM | POA: Diagnosis not present

## 2023-11-03 MED ORDER — IOHEXOL 350 MG/ML SOLN
75.0000 mL | Freq: Once | INTRAVENOUS | Status: AC | PRN
  Administered 2023-11-03: 75 mL via INTRAVENOUS

## 2023-11-08 NOTE — Progress Notes (Signed)
 Subjective:  Patient ID: Leslie Franklin, female    DOB: [birth year]  Age: 63 y.o. MRN: [id number]  Chief Complaint  Patient presents with   Medical Management of Chronic Issues   Discussed the use of AI scribe software for clinical note transcription with the patient, who gave verbal consent to proceed.  History of Present Illness   The patient presents for a follow-up visit after recent CT scans of the head and lungs. She reports not having received the results of the head CT scan, which was ordered by her neurologist, Dr. Everlena Cooper. The patient is also unsure about the status of her lung CT scan, which she believes may have been missed or not scheduled.  The patient has a history of high cholesterol and is currently on rosuvastatin. She also takes Lexapro and Ativan for depression and anxiety, with Ativan being used approximately once a day, sometimes twice. The patient also reports shortness of breath, which she experiences frequently, especially during physical activities. She is currently using a Breztri inhaler once a day, although it is prescribed for twice-daily use.  The patient has been taking protein shakes for malnutrition and has gained weight, from 96 pounds in October to 106 pounds at the time of the visit. She also takes mirtazapine at bedtime to help with sleep and appetite, and occasionally uses Zofran for nausea.  The patient is a smoker, currently at half a pack a day, and expresses interest in using nicotine patches to help quit. She also reports constant worry, despite her medication regimen for anxiety and depression.       High Cholesterol: Crestor 20 mg daily. Patient tries to eat healthy. Has no appetite.   GAD: Ativan 0.5 mg three times a day as needed. Usually takes once daily.   Depression: Lexapro 20 mg daily.  Calorie malnutrition: Protein shakes twice daily. She takes mirtazapine.  COPD: On breztri once daily/      11/09/2023    8:59 AM 08/05/2023    1:51 PM  04/06/2023    8:39 AM 12/14/2022   11:48 AM 12/14/2022   10:44 AM  Depression screen PHQ 2/9  Decreased Interest 1 3 2   0  Down, Depressed, Hopeless 1 2 2   0  PHQ - 2 Score 2 5 4   0  Altered sleeping 0 0 2 1   Tired, decreased energy 2 0 2 1   Change in appetite 1 0 3 3   Feeling bad or failure about yourself  0 0 0 0   Trouble concentrating 1 3 0 1   Moving slowly or fidgety/restless 0 2 0 1   Suicidal thoughts 0 0 0 0   PHQ-9 Score 6 10 11     Difficult doing work/chores Not difficult at all Not difficult at all Somewhat difficult Very difficult         11/09/2023    8:59 AM  Fall Risk   Falls in the past year? 0  Number falls in past yr: 0  Injury with Fall? 0  Risk for fall due to : No Fall Risks    Patient Care Team: Blane Ohara, MD as PCP - General (Family Medicine) Regan Lemming, MD as PCP - Electrophysiology (Cardiology) Revankar, Aundra Dubin, MD as PCP - Cardiology (Cardiology) Blane Ohara, MD as Referring Physician (Family Medicine) Drema Dallas, DO as Consulting Physician (Neurology)   Review of Systems  Constitutional:  Negative for chills, fatigue and fever.  HENT:  Negative for congestion, ear  pain and sore throat.   Respiratory:  Positive for cough and shortness of breath.   Cardiovascular:  Negative for chest pain and palpitations.  Gastrointestinal:  Negative for abdominal pain, constipation, diarrhea, nausea and vomiting.  Endocrine: Negative for polydipsia, polyphagia and polyuria.  Genitourinary:  Negative for difficulty urinating and dysuria.  Musculoskeletal:  Negative for arthralgias, back pain and myalgias.  Skin:  Negative for rash.  Neurological:  Negative for headaches.  Psychiatric/Behavioral:  Negative for dysphoric mood. The patient is not nervous/anxious.     Current Outpatient Medications on File Prior to Visit  Medication Sig Dispense Refill   aspirin 81 MG chewable tablet Chew 81 mg by mouth daily.      Budeson-Glycopyrrol-Formoterol (BREZTRI AEROSPHERE) 160-9-4.8 MCG/ACT AERO Inhale 2 puffs into the lungs 2 (two) times daily. 3 each 3   escitalopram (LEXAPRO) 20 MG tablet Take 1 tablet (20 mg total) by mouth daily. 90 tablet 1   LORazepam (ATIVAN) 0.5 MG tablet Take 1 tablet (0.5 mg total) by mouth 2 (two) times daily as needed for anxiety. 60 tablet 2   mirtazapine (REMERON) 45 MG tablet Take 1 tablet (45 mg total) by mouth at bedtime. 90 tablet 0   ondansetron (ZOFRAN) 4 MG tablet Take 1 tablet (4 mg total) by mouth every 8 (eight) hours as needed for nausea or vomiting. 30 tablet 1   No current facility-administered medications on file prior to visit.   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   Hyperlipidemia    Palpitations 09/18/2020   Peripheral vascular disease (HCC)    Presence of permanent cardiac pacemaker 11/20/2020   Screening mammogram for breast cancer 09/12/2020   Stroke (HCC)    Syncope 09/12/2020   Past Surgical History:  Procedure Laterality Date   CATARACT EXTRACTION BILATERAL W/ ANTERIOR VITRECTOMY Bilateral 11/2021   IR ANGIO INTRA EXTRACRAN SEL COM CAROTID INNOMINATE BILAT MOD SED  04/07/2021   IR ANGIO INTRA EXTRACRAN SEL COM CAROTID INNOMINATE BILAT MOD SED  06/04/2022   IR ANGIO INTRA EXTRACRAN SEL INTERNAL CAROTID UNI R MOD SED  04/21/2021   IR ANGIO VERTEBRAL SEL SUBCLAVIAN INNOMINATE BILAT MOD SED  04/07/2021   IR ANGIO VERTEBRAL SEL VERTEBRAL BILAT MOD SED  06/04/2022   IR ANGIOGRAM FOLLOW UP STUDY  04/21/2021   IR ANGIOGRAM FOLLOW UP STUDY   04/21/2021   IR CT HEAD LTD  04/21/2021   IR RADIOLOGIST EVAL & MGMT  03/12/2021   IR RADIOLOGIST EVAL & MGMT  05/07/2021   IR TRANSCATH/EMBOLIZ  04/21/2021   NO PAST SURGERIES     PACEMAKER IMPLANT N/A 10/21/2020   Procedure: PACEMAKER IMPLANT;  Surgeon: Marinus Maw, MD;  Location: [facility];  Service: Cardiovascular;  Laterality: N/A;   RADIOLOGY WITH ANESTHESIA N/A 04/07/2021   Procedure: EMBOLIZATION;  Surgeon: Julieanne Cotton, MD;  Location: [facility] OR;  Service: Radiology;  Laterality: N/A;   RADIOLOGY WITH ANESTHESIA N/A 04/21/2021   Procedure: RADIOLOGY WITH ANESTHESIA EMBOLIZATION;  Surgeon: Julieanne Cotton, MD;  Location: [facility] OR;  Service: Radiology;  Laterality: N/A;  TUBAL LIGATION      Family History  Problem Relation Age of Onset   Hypertension Mother    Diabetes Mother    Heart attack Father    Stroke Father    Emphysema Sister    Stroke Brother    Cancer Daughter        Breast (Braca 2 gene)   Breast cancer Daughter    Social History   Socioeconomic History   Marital status: Single    Spouse name: Not on file   Number of children: 4   Years of education: Not on file   Highest education level: 8th grade  Occupational History   Not on file  Tobacco Use   Smoking status: Every Day    Current packs/day: 0.50    Average packs/day: 1 pack/day for 41.7 years (41.4 ttl pk-yrs)    Types: Cigarettes    Start date: 09/01/1979    Last attempt to quit: 08/31/2020   Smokeless tobacco: Never  Vaping Use   Vaping status: Never Used  Substance and Sexual Activity   Alcohol use: Never   Drug use: Never   Sexual activity: Not Currently    Partners: Male    Birth control/protection: Post-menopausal  Other Topics Concern   Not on file  Social History Narrative   Not on file   Social Drivers of Health   Financial Resource Strain: Medium Risk (11/05/2023)   Overall Financial Resource Strain (CARDIA)    Difficulty of Paying Living Expenses: Somewhat hard   Food Insecurity: No Food Insecurity (11/05/2023)   Hunger Vital Sign    Worried About Running Out of Food in the Last Year: Never true    Ran Out of Food in the Last Year: Never true  Transportation Needs: No Transportation Needs (11/05/2023)   PRAPARE - Administrator, Civil Service (Medical): No    Lack of Transportation (Non-Medical): No  Physical Activity: Sufficiently Active (11/05/2023)   Exercise Vital Sign    Days of Exercise per Week: 7 days    Minutes of Exercise per Session: 30 min  Stress: No Stress Concern Present (11/05/2023)   [employer] of Occupational Health - Occupational Stress Questionnaire    Feeling of Stress : Only a little  Social Connections: Unknown (11/05/2023)   Social Connection and Isolation Panel [NHANES]    Frequency of Communication with Friends and Family: Once a week    Frequency of Social Gatherings with Friends and Family: Once a week    Attends Religious Services: Patient declined    Database administrator or Organizations: No    Attends Banker Meetings: Never    Marital Status: Divorced    Objective:  BP 90/64   Pulse 90   Temp (!) 97.4 F (36.3 C)   Resp 14   Ht 5' 3.5" (1.613 m)   Wt 106 lb (48.1 kg)   SpO2 96%   BMI 18.48 kg/m      11/09/2023    8:51 AM 10/25/2023    9:09 AM 08/19/2023   10:29 AM  BP/Weight  Systolic BP 90 120 128  Diastolic BP 64 68 80  Wt. (Lbs) 106 104 101  BMI 18.48 kg/m2 18.13 kg/m2 17.89 kg/m2    Physical Exam Vitals reviewed.  Constitutional:      Appearance: Normal appearance. She is normal weight.  Neck:     Vascular: No carotid bruit.  Cardiovascular:     Rate and Rhythm: Normal rate and regular  rhythm.     Heart sounds: Normal heart sounds.  Pulmonary:     Effort: Pulmonary effort is normal. No respiratory distress.     Breath sounds: Normal breath sounds.  Abdominal:     General: Abdomen is flat. Bowel sounds are normal.     Palpations: Abdomen is soft.      Tenderness: There is no abdominal tenderness.  Neurological:     Mental Status: She is alert and oriented to person, place, and time.  Psychiatric:        Mood and Affect: Mood normal.        Behavior: Behavior normal.     Diabetic Foot Exam - Simple   No data filed      Lab Results  Component Value Date   WBC 7.4 11/09/2023   HGB 14.7 11/09/2023   HCT 45.9 11/09/2023   PLT 253 11/09/2023   GLUCOSE 91 11/09/2023   CHOL 178 11/09/2023   TRIG 87 11/09/2023   HDL 60 11/09/2023   LDLCALC 102 (H) 11/09/2023   ALT 17 11/09/2023   AST 26 11/09/2023   NA 142 11/09/2023   K 5.0 11/09/2023   CL 106 11/09/2023   CREATININE 1.03 (H) 11/09/2023   BUN 22 11/09/2023   CO2 22 11/09/2023   TSH 2.010 04/06/2023   INR 1.0 06/04/2022      Assessment & Plan:   Mixed hyperlipidemia Assessment & Plan: Slightly elevated cholesterol levels despite rosuvastatin. - Continue rosuvastatin. - Monitor cholesterol levels.  Orders: -     CBC with Differential/Platelet -     Comprehensive metabolic panel -     Lipid panel  Cigarette nicotine dependence with nicotine-induced disorder Assessment & Plan: Smoking half a pack daily, discussed nicotine patches as cessation aid. - Prescribed nicotine patches. - Provided education on nicotine patch use.  Orders: -     CT CHEST LUNG CANCER SCREENING LOW DOSE WO CONTRAST -     Nicotine; Place 1 patch (14 mg total) onto the skin daily.  Dispense: 28 patch; Refill: 0 -     Nicotine; Place 1 patch (7 mg total) onto the skin daily.  Dispense: 28 patch; Refill: 0  Simple chronic bronchitis (HCC) Assessment & Plan: She used Breztri inhaler incorrectly and lacked a rescue inhaler. - Educated on proper Breztri use and adherence to regimen. - Prescribed albuterol inhaler for rescue use. - Ordered CT scan of lungs at [facility].  Orders: -     Albuterol Sulfate HFA; Inhale 2 puffs into the lungs every 6 (six) hours as needed for wheezing or  shortness of breath.  Dispense: 8 g; Refill: 2  Screening for lung cancer Assessment & Plan: Order lung cancer ct scan.  Orders: -     CT CHEST LUNG CANCER SCREENING LOW DOSE WO CONTRAST  Encounter for immunization -     Pneumococcal conjugate vaccine 20-valent -     Varicella-zoster vaccine IM  Moderate episode of recurrent major depressive disorder (HCC) Assessment & Plan: Persistent worry despite current medication regimen. - Continue current medications. - Encouraged non-pharmacological anxiety management techniques.   Severe protein-calorie malnutrition (HCC) Assessment & Plan: Improved nutritional status with weight gain. - Continue two protein shakes daily.   Cerebral aneurysm Assessment & Plan: CTA of the brain results pending, follow-up with Dr. Everlena Cooper required. - Sent note to Dr. Everlena Cooper to follow up on CTA results. Dr. Corliss Skains actually ordered it. - Instructed her to call our office, if no response by  early next week.      Meds ordered this encounter  Medications   albuterol (VENTOLIN HFA) 108 (90 Base) MCG/ACT inhaler    Sig: Inhale 2 puffs into the lungs every 6 (six) hours as needed for wheezing or shortness of breath.    Dispense:  8 g    Refill:  2   nicotine (NICODERM CQ) 14 mg/24hr patch    Sig: Place 1 patch (14 mg total) onto the skin daily.    Dispense:  28 patch    Refill:  0   nicotine (NICODERM CQ) 7 mg/24hr patch    Sig: Place 1 patch (7 mg total) onto the skin daily.    Dispense:  28 patch    Refill:  0    Orders Placed This Encounter  Procedures   CT CHEST LUNG CANCER SCREENING LOW DOSE WO CONTRAST   Pneumococcal conjugate vaccine 20-valent   Varicella-zoster vaccine IM   CBC with Differential/Platelet   Comprehensive metabolic panel   Lipid panel     Follow-up: Return in about 3 months (around 02/09/2024) for chronic follow up.   I,Marla I Leal-Borjas,acting as a scribe for Blane Ohara, MD.,have documented all relevant  documentation on the behalf of Blane Ohara, MD,as directed by  Blane Ohara, MD while in the presence of Blane Ohara, MD.   An After Visit Summary was printed and given to the patient.  I attest that I have reviewed this visit and agree with the plan scribed by my staff.   Blane Ohara, MD [facility] [phone]

## 2023-11-09 ENCOUNTER — Encounter: Payer: Self-pay | Admitting: Family Medicine

## 2023-11-09 ENCOUNTER — Ambulatory Visit: Payer: Medicaid Other | Admitting: Family Medicine

## 2023-11-09 VITALS — BP 90/64 | HR 90 | Temp 97.4°F | Resp 14 | Ht 63.5 in | Wt 106.0 lb

## 2023-11-09 DIAGNOSIS — Z23 Encounter for immunization: Secondary | ICD-10-CM

## 2023-11-09 DIAGNOSIS — E782 Mixed hyperlipidemia: Secondary | ICD-10-CM

## 2023-11-09 DIAGNOSIS — Z122 Encounter for screening for malignant neoplasm of respiratory organs: Secondary | ICD-10-CM

## 2023-11-09 DIAGNOSIS — E43 Unspecified severe protein-calorie malnutrition: Secondary | ICD-10-CM | POA: Diagnosis not present

## 2023-11-09 DIAGNOSIS — I671 Cerebral aneurysm, nonruptured: Secondary | ICD-10-CM | POA: Diagnosis not present

## 2023-11-09 DIAGNOSIS — F17219 Nicotine dependence, cigarettes, with unspecified nicotine-induced disorders: Secondary | ICD-10-CM | POA: Diagnosis not present

## 2023-11-09 DIAGNOSIS — J410 Simple chronic bronchitis: Secondary | ICD-10-CM

## 2023-11-09 DIAGNOSIS — F331 Major depressive disorder, recurrent, moderate: Secondary | ICD-10-CM

## 2023-11-09 MED ORDER — NICOTINE 14 MG/24HR TD PT24
14.0000 mg | MEDICATED_PATCH | Freq: Every day | TRANSDERMAL | 0 refills | Status: DC
Start: 2023-11-09 — End: 2024-05-30

## 2023-11-09 MED ORDER — NICOTINE 7 MG/24HR TD PT24: 7.0000 mg | MEDICATED_PATCH | Freq: Every day | TRANSDERMAL | 0 refills | Status: DC

## 2023-11-09 MED ORDER — ALBUTEROL SULFATE HFA 108 (90 BASE) MCG/ACT IN AERS: 2.0000 | INHALATION_SPRAY | Freq: Four times a day (QID) | RESPIRATORY_TRACT | 2 refills | Status: DC | PRN

## 2023-11-09 NOTE — Patient Instructions (Signed)
 VISIT SUMMARY:  During today's visit, we reviewed your recent CT scans, discussed your ongoing health concerns, and updated your treatment plans. We addressed your COPD management, pending CT scan results, depression and anxiety, cholesterol levels, nutritional status, and smoking cessation efforts. We also discussed your general health maintenance, including vaccinations.  YOUR PLAN:  -CHRONIC OBSTRUCTIVE PULMONARY DISEASE (COPD): COPD is a chronic lung condition that makes it hard to breathe. We discussed the correct use of your Breztri inhaler and prescribed an albuterol inhaler for emergency use. A CT scan of your lungs has been ordered at [facility].  -PENDING CT SCAN RESULTS: We are waiting for the results of your brain CT scan. A note has been sent to Dr. Everlena Cooper to follow up on these results. Please call if you do not hear back by early next week.  -DEPRESSION AND ANXIETY: Despite your current medications, you are still experiencing persistent worry. We recommend continuing your current medications and trying non-medication techniques to manage anxiety.  -HYPERLIPIDEMIA: Hyperlipidemia means you have high cholesterol levels. We recommend continuing your current medication, rosuvastatin, and monitoring your cholesterol levels.  -MALNUTRITION: Your nutritional status has improved, and you have gained weight. Continue taking two protein shakes daily to maintain your progress.  -SMOKING CESSATION: You are currently smoking half a pack a day and are interested in quitting. We have prescribed nicotine patches and provided education on how to use them.  -GENERAL HEALTH MAINTENANCE: You are due for some vaccinations. We administered the first shingles vaccine and the Prevnar 20 vaccine today. We also discussed your preferences regarding the COVID-19 vaccine.  INSTRUCTIONS:  Please follow up with Dr. Everlena Cooper regarding your brain CT scan results by early next week if you do not hear back.  Continue using your Breztri inhaler as prescribed and use the albuterol inhaler for emergencies. Maintain your current medications for depression, anxiety, and high cholesterol. Keep taking two protein shakes daily. Start using the nicotine patches as directed to help quit smoking. Stay up to date with your vaccinations and let us know if you have any questions or concerns.

## 2023-11-10 ENCOUNTER — Encounter: Payer: Self-pay | Admitting: Family Medicine

## 2023-11-10 LAB — CBC WITH DIFFERENTIAL/PLATELET
Basophils Absolute: 0.1 10*3/uL (ref 0.0–0.2)
Basos: 1 %
EOS (ABSOLUTE): 0.3 10*3/uL (ref 0.0–0.4)
Eos: 4 %
Hematocrit: 45.9 % (ref 34.0–46.6)
Hemoglobin: 14.7 g/dL (ref 11.1–15.9)
Immature Grans (Abs): 0 10*3/uL (ref 0.0–0.1)
Immature Granulocytes: 0 %
Lymphocytes Absolute: 2 10*3/uL (ref 0.7–3.1)
Lymphs: 27 %
MCH: 29.9 pg (ref 26.6–33.0)
MCHC: 32 g/dL (ref 31.5–35.7)
MCV: 94 fL (ref 79–97)
Monocytes Absolute: 0.6 10*3/uL (ref 0.1–0.9)
Monocytes: 8 %
Neutrophils Absolute: 4.4 10*3/uL (ref 1.4–7.0)
Neutrophils: 60 %
Platelets: 253 10*3/uL (ref 150–450)
RBC: 4.91 x10E6/uL (ref 3.77–5.28)
RDW: 12.1 % (ref 11.7–15.4)
WBC: 7.4 10*3/uL (ref 3.4–10.8)

## 2023-11-10 LAB — LIPID PANEL
Chol/HDL Ratio: 3 ratio (ref 0.0–4.4)
Cholesterol, Total: 178 mg/dL (ref 100–199)
HDL: 60 mg/dL (ref >39)
LDL Chol Calc (NIH): 102 mg/dL — ABNORMAL HIGH (ref 0–99)
Triglycerides: 87 mg/dL (ref 0–149)
VLDL Cholesterol Cal: 16 mg/dL (ref 5–40)

## 2023-11-10 LAB — COMPREHENSIVE METABOLIC PANEL
ALT: 17 IU/L (ref 0–32)
AST: 26 IU/L (ref 0–40)
Albumin: 4.5 g/dL (ref 3.8–4.9)
Alkaline Phosphatase: 92 IU/L (ref 44–121)
BUN/Creatinine Ratio: 21 (ref 12–28)
BUN: 22 mg/dL (ref 8–27)
Bilirubin Total: <0.2 mg/dL (ref 0.0–1.2)
CO2: 22 mmol/L (ref 20–29)
Calcium: 9.2 mg/dL (ref 8.7–10.3)
Chloride: 106 mmol/L (ref 96–106)
Creatinine, Ser: 1.03 mg/dL — ABNORMAL HIGH (ref 0.57–1.00)
Globulin, Total: 2.2 g/dL (ref 1.5–4.5)
Glucose: 91 mg/dL (ref 70–99)
Potassium: 5 mmol/L (ref 3.5–5.2)
Sodium: 142 mmol/L (ref 134–144)
Total Protein: 6.7 g/dL (ref 6.0–8.5)
eGFR: 62 mL/min/{1.73_m2} (ref >59)

## 2023-11-11 ENCOUNTER — Telehealth (HOSPITAL_COMMUNITY): Payer: Self-pay

## 2023-11-11 ENCOUNTER — Other Ambulatory Visit: Payer: Self-pay

## 2023-11-11 MED ORDER — ROSUVASTATIN CALCIUM 40 MG PO TABS: 40.0000 mg | ORAL_TABLET | Freq: Every day | ORAL | 3 refills | Status: AC

## 2023-11-11 NOTE — Telephone Encounter (Signed)
Pt agreed to f/u in 1 year with a cta head/neck. AB

## 2023-11-14 DIAGNOSIS — Z23 Encounter for immunization: Secondary | ICD-10-CM

## 2023-11-14 HISTORY — DX: Encounter for immunization: Z23

## 2023-11-14 NOTE — Assessment & Plan Note (Signed)
 Order lung cancer ct scan

## 2023-11-14 NOTE — Assessment & Plan Note (Signed)
 Smoking half a pack daily, discussed nicotine patches as cessation aid. - Prescribed nicotine patches. - Provided education on nicotine patch use.

## 2023-11-14 NOTE — Assessment & Plan Note (Signed)
 Slightly elevated cholesterol levels despite rosuvastatin. - Continue rosuvastatin. - Monitor cholesterol levels.

## 2023-11-14 NOTE — Assessment & Plan Note (Signed)
 Persistent worry despite current medication regimen. - Continue current medications. - Encouraged non-pharmacological anxiety management techniques.

## 2023-11-14 NOTE — Assessment & Plan Note (Addendum)
 CTA of the brain results pending, follow-up with Dr. Everlena Cooper required. - Sent note to Dr. Everlena Cooper to follow up on CTA results. Dr. Corliss Skains actually ordered it. - Instructed her to call our office, if no response by early next week.

## 2023-11-14 NOTE — Assessment & Plan Note (Signed)
 Improved nutritional status with weight gain. - Continue two protein shakes daily.

## 2023-11-14 NOTE — Assessment & Plan Note (Signed)
 She used Breztri inhaler incorrectly and lacked a rescue inhaler. - Educated on proper Breztri use and adherence to regimen. - Prescribed albuterol inhaler for rescue use. - Ordered CT scan of lungs at [facility].

## 2023-11-17 ENCOUNTER — Ambulatory Visit (HOSPITAL_BASED_OUTPATIENT_CLINIC_OR_DEPARTMENT_OTHER)
Admission: RE | Admit: 2023-11-17 | Discharge: 2023-11-17 | Disposition: A | Discharge status: Home / Self Care | Source: Ambulatory Visit | Attending: Family Medicine | Admitting: Family Medicine

## 2023-11-17 DIAGNOSIS — F17210 Nicotine dependence, cigarettes, uncomplicated: Secondary | ICD-10-CM

## 2023-11-23 NOTE — Progress Notes (Signed)
 Remote pacemaker transmission.

## 2023-11-23 NOTE — Addendum Note (Signed)
 Addended by: Geralyn Flash D on: 11/23/2023 02:54 PM   Modules accepted: Orders

## 2023-12-12 ENCOUNTER — Encounter: Payer: Self-pay | Admitting: Family Medicine

## 2023-12-28 ENCOUNTER — Other Ambulatory Visit: Payer: Self-pay | Admitting: Family Medicine

## 2023-12-28 ENCOUNTER — Other Ambulatory Visit: Payer: Self-pay

## 2023-12-28 DIAGNOSIS — F411 Generalized anxiety disorder: Secondary | ICD-10-CM

## 2023-12-28 DIAGNOSIS — E43 Unspecified severe protein-calorie malnutrition: Secondary | ICD-10-CM

## 2023-12-28 MED ORDER — ONDANSETRON HCL 4 MG PO TABS
4.0000 mg | ORAL_TABLET | Freq: Three times a day (TID) | ORAL | 1 refills | Status: AC | PRN
Start: 2023-12-28 — End: ?

## 2024-01-17 ENCOUNTER — Ambulatory Visit: Payer: Medicaid Other

## 2024-01-17 DIAGNOSIS — I442 Atrioventricular block, complete: Secondary | ICD-10-CM

## 2024-01-18 LAB — CUP PACEART REMOTE DEVICE CHECK
Battery Voltage: 75
Date Time Interrogation Session: 20250519092257
Implantable Lead Connection Status: 753985
Implantable Lead Connection Status: 753985
Implantable Lead Implant Date: 20220221
Implantable Lead Implant Date: 20220221
Implantable Lead Location: 753859
Implantable Lead Location: 753860
Implantable Lead Model: 377171
Implantable Lead Model: 377171
Implantable Lead Serial Number: 8000187789
Implantable Lead Serial Number: 8000197459
Implantable Pulse Generator Implant Date: 20220221
Pulse Gen Model: 407145
Pulse Gen Serial Number: 70053960

## 2024-01-20 ENCOUNTER — Ambulatory Visit: Payer: Self-pay | Admitting: Cardiology

## 2024-01-28 NOTE — Progress Notes (Signed)
 NEUROLOGY FOLLOW UP OFFICE NOTE  Leslie Franklin [id number]  Assessment/Plan:   1.  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  Migraine without aura, without status migrainosus, not intractable, onset with discovery of aneurysm - stable 3.  Right carotid stenosis 4.  Cerebrovascular disease    1.  Repeat bilateral carotid Doppler in 2.5 months for monitoring of the right carotid stenosis 2.  Continue current medications for secondary stroke prevention as managed by PCP:  ASA, Crestor  3. Follow up one year     Subjective:  Leslie Franklin is a 63 year old right-handed female with COPD, HLD, depression and history of complete heart block s/p PPM and essential tremor who follows up for cerebral aneurysm.  She is accompanied by her daughter who supplements history.  Cerebral angio report reviewed.  UPDATE: Current medications:  ASA 81mg , rosuvastatin  40mg    11/09/2023 labs:  LDL 102. Rosuvastatin  was increased to 40mg  from 20mg  Feeling well.  Once in awhile may have pressure in the left ear but no stroke like symptoms.     HISTORY: She began experiencing recurrent dizziness and syncope for maybe 10 years but started becoming worse since 2018.  The dizziness feels like "getting up too fast".  It occurs when standing, moving or sitting.  It lasts a couple of minutes.  Usually will lay down.  It is associated with tunnel vision, palpitations, diaphoresis and feeling hot.  No nausea.  She sometimes feels like she is going to pass out and has actually passed out at least three times.  No postictal confusion, tongue/cheek laceration or incontinence.  She has been evaluated by cardiology.  Echocardiogram on 10/14/2020 was unremarkable with EF 60-65% with no valvular abnormalities or PFO/IAS.  Cardiac monitor has recorded episodes of daytime complete heart block.  She had a pacemaker implanted on 10/21/2020.  To further evaluate symptoms, she had  a CT head on 11/12/2020 which demonstrated age indeterminate small lacunar infarcts in the right frontal cortical regiion and right cerebellar hemisphere.  She was started on ASA 81mg  daily.  Carotid ultrasound on 01/06/2021 showed less than 50% stenosis in the bilateral carotid arteries and antegrade flow in the vertebral arteries bilaterally.  She was started on Crestor .  LDL from 12/25/2020 was 110 (down from 218 in January).  TSH from 09/13/2020 was 1.220.  Since the pacemaker, she has less dizzy spells but they still occurred daily.  Underwent imaging on 03/06/2021.  MRI of brain without contrast showed mild chronic small vessel ischemic changes and redemonstrated small chronic cortical/subcortical infarct within the right middle frontal gyrus and three chronic lacunar nfarcts within the right cerebellar hemisphere but no acute abnormalities.  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  She was referred to interventional radiology and underwent endovascular treatment using pipeline flow diverter device  Dizziness has improved after the surgery.    Since the surgery, still with headache but overall improved.  Described as a left sided supraorbital/ear pressure headache still occurs but improved.  May occur off and on for up to 4 days.  It occurs infrequently.  Last one occurred 2 weeks ago.  She doesn't take anything for it.  Tylenol  ineffective.  Will just lay down.  Associated dizziness, nausea, photophobia and phonophobia.  She had a repeat  cerebral angiogram on 06/05/2022 which continued to show endovascular obliteration of the large right paraophthalmic region aneurysm with coil assisted flow diversion and no intra stent stenosis.  Recommended follow up carotid ultrasound in 6 months.  Bilateral carotid Doppler on 04/16/2023 revealed  40-59% stenosis in the right ICA but other vessels normal, including left ICA, vertebral arteries and subclavians.    PAST MEDICAL HISTORY: 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   Hyperlipidemia    Palpitations 09/18/2020   Peripheral vascular disease (HCC)    Presence of permanent cardiac pacemaker 11/20/2020   Screening mammogram for breast cancer 09/12/2020   Stroke [facility])    Syncope 09/12/2020    MEDICATIONS: Current Outpatient Medications on File Prior to Visit  Medication Sig Dispense Refill   albuterol  (VENTOLIN  HFA) 108 (90 Base) MCG/ACT inhaler Inhale 2 puffs into the lungs every 6 (six) hours as needed for wheezing or shortness of breath. 8 g 2   aspirin  81 MG chewable tablet Chew 81 mg by mouth daily.     Budeson-Glycopyrrol-Formoterol  (BREZTRI  AEROSPHERE) 160-9-4.8 MCG/ACT AERO Inhale 2 puffs into the lungs 2 (two) times daily. 3 each 3   escitalopram  (LEXAPRO ) 20 MG tablet Take 1 tablet (20 mg total) by mouth daily. 90 tablet 1   LORazepam  (ATIVAN ) 0.5 MG tablet Take 1 tablet (0.5 mg total) by mouth 2 (two) times daily as needed for anxiety. 60 tablet 2   mirtazapine  (REMERON ) 45 MG tablet TAKE 1 TABLET(45 MG) BY MOUTH AT BEDTIME 90 tablet 0   nicotine  (NICODERM CQ ) 14 mg/24hr patch Place 1 patch (14 mg total) onto the skin daily. 28 patch 0   nicotine  (NICODERM CQ ) 7 mg/24hr patch Place 1 patch (7 mg total) onto the skin daily. 28 patch 0   ondansetron  (ZOFRAN ) 4 MG tablet Take 1 tablet (4 mg total) by mouth every 8 (eight) hours as needed for nausea or vomiting.  30 tablet 1   rosuvastatin  (CRESTOR ) 40 MG tablet Take 1 tablet (40 mg total) by mouth daily. 90 tablet 3   No current facility-administered medications on file prior to visit.    ALLERGIES: Allergies  Allergen Reactions   Buspirone  Nausea Only    FAMILY HISTORY: Family History  Problem Relation Age of Onset   Hypertension Mother    Diabetes Mother    Heart attack Father    Stroke Father    Emphysema Sister    Stroke Brother    Cancer Daughter        Breast (Braca 2 gene)   Breast cancer Daughter     Objective: Blood pressure (!) 119/59, pulse 86, height 5\' 3"  (1.6 m), weight 105 lb (47.6 kg), SpO2 97%. General: No acute distress.  Patient appears well-groomed.   Head:  Normocephalic/atraumatic Neck:  Supple.  No paraspinal tenderness.  Full range of motion. Heart:  Regular rate and rhythm. Neurological Exam: alert and oriented.  Speech fluent and not dysarthric, language intact.  CN II-XII intact. Bulk and tone normal, muscle strength 5/5 throughout.  Mild postural and kinetic tremor in the  hands.  Sensation to light touch intact.  Deep tendon reflexes 2+ throughout, toes downgoing.  Finger to nose testing intact.  Gait normal, Romberg negative.    Janne Members, DO  CC: Mercy Stall, MD

## 2024-01-31 ENCOUNTER — Ambulatory Visit: Payer: Medicaid Other | Admitting: Neurology

## 2024-01-31 ENCOUNTER — Encounter: Payer: Self-pay | Admitting: Neurology

## 2024-01-31 VITALS — BP 119/59 | HR 86 | Ht 63.0 in | Wt 105.0 lb

## 2024-01-31 DIAGNOSIS — I6521 Occlusion and stenosis of right carotid artery: Secondary | ICD-10-CM | POA: Diagnosis not present

## 2024-01-31 DIAGNOSIS — G43009 Migraine without aura, not intractable, without status migrainosus: Secondary | ICD-10-CM | POA: Diagnosis not present

## 2024-01-31 DIAGNOSIS — Z9889 Other specified postprocedural states: Secondary | ICD-10-CM

## 2024-01-31 DIAGNOSIS — Z8679 Personal history of other diseases of the circulatory system: Secondary | ICD-10-CM

## 2024-01-31 NOTE — Patient Instructions (Signed)
 Repeat bilateral carotid ultrasound in 2 and 1/2 months  Follow up in one year or as needed.

## 2024-02-02 ENCOUNTER — Other Ambulatory Visit: Payer: Self-pay | Admitting: Family Medicine

## 2024-02-02 DIAGNOSIS — E782 Mixed hyperlipidemia: Secondary | ICD-10-CM

## 2024-02-13 NOTE — Progress Notes (Signed)
 Subjective:  Patient ID: Leslie Franklin, female    DOB: Jan 15, 1961  Age: 63 y.o. MRN: [id number]  Chief Complaint  Patient presents with   Medical Management of Chronic Issues    HPI:  High Cholesterol: Crestor  40 mg daily and aspirin  81 mg daily. Patient tries to eat healthy. Has no appetite.   GAD: Ativan  0.5 mg three times a day as needed. Usually takes once daily.   Depression: Lexapro  20 mg daily.  Calorie malnutrition: Protein shakes twice daily. She takes mirtazapine  45 mg once daily at night. She has gained some weight.   COPD: On breztri  once daily.     02/14/2024    8:19 AM 11/09/2023    8:59 AM 08/05/2023    1:51 PM 04/06/2023    8:39 AM 12/14/2022   11:48 AM  Depression screen PHQ 2/9  Decreased Interest 0 1 3 2    Down, Depressed, Hopeless 0 1 2 2    PHQ - 2 Score 0 2 5 4    Altered sleeping 0 0 0 2 1  Tired, decreased energy 0 2 0 2 1  Change in appetite 1 1 0 3 3  Feeling bad or failure about yourself  0 0 0 0 0  Trouble concentrating 0 1 3 0 1  Moving slowly or fidgety/restless 0 0 2 0 1  Suicidal thoughts 0 0 0 0 0  PHQ-9 Score 1 6 10 11    Difficult doing work/chores Not difficult at all Not difficult at all Not difficult at all Somewhat difficult Very difficult        01/31/2024    9:06 AM  Fall Risk   Falls in the past year? 0  Number falls in past yr: 0  Injury with Fall? 0  Follow up Falls evaluation completed    Patient Care Team: Sherre Clapper, MD as PCP - General (Family Medicine) Inocencio Soyla Lunger, MD as PCP - Electrophysiology (Cardiology) Revankar, Jennifer SAUNDERS, MD as PCP - Cardiology (Cardiology) Sherre Clapper, MD as Referring Physician (Family Medicine) Skeet Juliene SAUNDERS, DO as Consulting Physician (Neurology)   Review of Systems  Constitutional:  Negative for chills, diaphoresis, fatigue and fever.  HENT:  Negative for congestion, ear pain and sinus pain.   Eyes: Negative.   Respiratory:  Negative for cough and shortness of breath.    Cardiovascular:  Negative for chest pain.  Gastrointestinal:  Negative for abdominal pain, constipation, nausea and vomiting.  Endocrine: Negative.   Genitourinary:  Negative for dysuria.  Musculoskeletal:  Negative for arthralgias.  Skin: Negative.   Allergic/Immunologic: Negative.   Neurological:  Negative for weakness and headaches.  Hematological: Negative.   Psychiatric/Behavioral:  Negative for dysphoric mood. The patient is not nervous/anxious.     Current Outpatient Medications on File Prior to Visit  Medication Sig Dispense Refill   albuterol  (VENTOLIN  HFA) 108 (90 Base) MCG/ACT inhaler Inhale 2 puffs into the lungs every 6 (six) hours as needed for wheezing or shortness of breath. 8 g 2   aspirin  81 MG chewable tablet Chew 81 mg by mouth daily.     Budeson-Glycopyrrol-Formoterol  (BREZTRI  AEROSPHERE) 160-9-4.8 MCG/ACT AERO Inhale 2 puffs into the lungs 2 (two) times daily. 3 each 3   escitalopram  (LEXAPRO ) 20 MG tablet Take 1 tablet (20 mg total) by mouth daily. 90 tablet 1   LORazepam  (ATIVAN ) 0.5 MG tablet Take 1 tablet (0.5 mg total) by mouth 2 (two) times daily as needed for anxiety. 60 tablet 2   mirtazapine  (REMERON ) 45  MG tablet TAKE 1 TABLET(45 MG) BY MOUTH AT BEDTIME 90 tablet 0   nicotine  (NICODERM CQ ) 14 mg/24hr patch Place 1 patch (14 mg total) onto the skin daily. 28 patch 0   nicotine  (NICODERM CQ ) 7 mg/24hr patch Place 1 patch (7 mg total) onto the skin daily. 28 patch 0   ondansetron  (ZOFRAN ) 4 MG tablet Take 1 tablet (4 mg total) by mouth every 8 (eight) hours as needed for nausea or vomiting. 30 tablet 1   rosuvastatin  (CRESTOR ) 40 MG tablet Take 1 tablet (40 mg total) by mouth daily. 90 tablet 3   No current facility-administered medications on file prior to visit.   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   Cardiac murmur 09/18/2020   Carotid atherosclerosis 02/26/2021    Cerebral aneurysm 04/07/2021   Chest pain of uncertain etiology 09/18/2020   Claudication (HCC) 09/12/2020   COPD (chronic obstructive pulmonary disease) (HCC)    Depression    Depression    Dyspnea    Ex-smoker 09/18/2020   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   Hyperlipidemia    Palpitations 09/18/2020   Peripheral vascular disease (HCC)    Presence of permanent cardiac pacemaker 11/20/2020   Screening mammogram for breast cancer 09/12/2020   Stroke (HCC)    Syncope 09/12/2020   Past Surgical History:  Procedure Laterality Date   CATARACT EXTRACTION BILATERAL W/ ANTERIOR VITRECTOMY Bilateral 11/2021   IR ANGIO INTRA EXTRACRAN SEL COM CAROTID INNOMINATE BILAT MOD SED  04/07/2021   IR ANGIO INTRA EXTRACRAN SEL COM CAROTID INNOMINATE BILAT MOD SED  06/04/2022   IR ANGIO INTRA EXTRACRAN SEL INTERNAL CAROTID UNI R MOD SED  04/21/2021   IR ANGIO VERTEBRAL SEL SUBCLAVIAN INNOMINATE BILAT MOD SED  04/07/2021   IR ANGIO VERTEBRAL SEL VERTEBRAL BILAT MOD SED  06/04/2022   IR ANGIOGRAM FOLLOW UP STUDY  04/21/2021   IR ANGIOGRAM FOLLOW UP STUDY  04/21/2021   IR CT HEAD LTD  04/21/2021   IR RADIOLOGIST EVAL & MGMT  03/12/2021   IR RADIOLOGIST EVAL & MGMT  05/07/2021   IR TRANSCATH/EMBOLIZ  04/21/2021   NO PAST SURGERIES     PACEMAKER IMPLANT N/A 10/21/2020   Procedure: PACEMAKER IMPLANT;  Surgeon: Waddell Danelle ORN, MD;  Location: [facility];  Service: Cardiovascular;  Laterality: N/A;   RADIOLOGY WITH ANESTHESIA N/A 04/07/2021   Procedure: EMBOLIZATION;  Surgeon: Dolphus Carrion, MD;  Location: [facility] OR;  Service: Radiology;  Laterality: N/A;   RADIOLOGY WITH ANESTHESIA N/A 04/21/2021   Procedure: RADIOLOGY WITH ANESTHESIA EMBOLIZATION;  Surgeon: Dolphus Carrion, MD;  Location: [facility] OR;  Service: Radiology;  Laterality: N/A;   TUBAL LIGATION      Family History  Problem Relation Age of Onset   Hypertension Mother    Diabetes Mother    Heart attack Father    Stroke Father     Emphysema Sister    Stroke Brother    Cancer Daughter        Breast (Braca 2 gene)   Breast cancer Daughter    Social History   Socioeconomic History   Marital status: Single    Spouse name: Not on file   Number of children: 4   Years of education: Not on file   Highest education level: 8th grade  Occupational History   Not on file  Tobacco Use   Smoking status: Every Day    Current  packs/day: 0.50    Average packs/day: 1 pack/day for 42.0 years (41.5 ttl pk-yrs)    Types: Cigarettes    Start date: 09/01/1979    Last attempt to quit: 08/31/2020   Smokeless tobacco: Never  Vaping Use   Vaping status: Never Used  Substance and Sexual Activity   Alcohol use: Never   Drug use: Never   Sexual activity: Not Currently    Partners: Male    Birth control/protection: Post-menopausal  Other Topics Concern   Not on file  Social History Narrative   Not on file   Social Drivers of Health   Financial Resource Strain: Low Risk  (02/13/2024)   Overall Financial Resource Strain (CARDIA)    Difficulty of Paying Living Expenses: Not very hard  Food Insecurity: No Food Insecurity (02/13/2024)   Hunger Vital Sign    Worried About Running Out of Food in the Last Year: Never true    Ran Out of Food in the Last Year: Never true  Transportation Needs: No Transportation Needs (02/13/2024)   PRAPARE - Administrator, Civil Service (Medical): No    Lack of Transportation (Non-Medical): No  Physical Activity: Sufficiently Active (02/13/2024)   Exercise Vital Sign    Days of Exercise per Week: 7 days    Minutes of Exercise per Session: 30 min  Stress: No Stress Concern Present (02/13/2024)   [employer] of Occupational Health - Occupational Stress Questionnaire    Feeling of Stress: Only a little  Social Connections: Socially Isolated (02/13/2024)   Social Connection and Isolation Panel    Frequency of Communication with Friends and Family: Three times a week    Frequency of  Social Gatherings with Friends and Family: Once a week    Attends Religious Services: Never    Database administrator or Organizations: No    Attends Engineer, structural: Not on file    Marital Status: Divorced    Objective:  BP 128/72   Pulse 81   Temp 97.8 F (36.6 C) (Temporal)   Resp 16   Ht 5' 3 (1.6 m)   Wt 106 lb (48.1 kg)   SpO2 96%   BMI 18.78 kg/m      02/14/2024    7:31 AM 01/31/2024    9:06 AM 11/09/2023    8:51 AM  BP/Weight  Systolic BP 128 119 90  Diastolic BP 72 59 64  Wt. (Lbs) 106 105 106  BMI 18.78 kg/m2 18.6 kg/m2 18.48 kg/m2    Physical Exam Vitals reviewed.  Constitutional:      Appearance: Normal appearance. She is normal weight.  Neck:     Vascular: No carotid bruit.   Cardiovascular:     Rate and Rhythm: Normal rate and regular rhythm.     Heart sounds: Normal heart sounds.  Pulmonary:     Effort: Pulmonary effort is normal. No respiratory distress.     Breath sounds: Normal breath sounds.  Abdominal:     General: Abdomen is flat. Bowel sounds are normal.     Palpations: Abdomen is soft.     Tenderness: There is no abdominal tenderness.   Neurological:     Mental Status: She is alert and oriented to person, place, and time.   Psychiatric:        Mood and Affect: Mood normal.        Behavior: Behavior normal.         Lab Results  Component Value Date  WBC 7.4 11/09/2023   HGB 14.7 11/09/2023   HCT 45.9 11/09/2023   PLT 253 11/09/2023   GLUCOSE 110 (H) 02/14/2024   CHOL 143 02/14/2024   TRIG 65 02/14/2024   HDL 52 02/14/2024   LDLCALC 78 02/14/2024   ALT 22 02/14/2024   AST 28 02/14/2024   NA 143 02/14/2024   K 5.1 02/14/2024   CL 106 02/14/2024   CREATININE 0.92 02/14/2024   BUN 9 02/14/2024   CO2 22 02/14/2024   TSH 2.010 04/06/2023   INR 1.0 06/04/2022      Assessment & Plan:  Simple chronic bronchitis (HCC) Assessment & Plan: Improving. Continue breztri  2 puffs twice daily. Albuterol  as needed.     Cigarette nicotine  dependence with nicotine -induced disorder Assessment & Plan: Encouragement. Patient has cut down and is feeling better.    Mild recurrent major depression (HCC) Assessment & Plan: The current medical regimen is effective;  continue present plan and medications. - Continue current medications. Lexapro  and lorazepam .    Mixed hyperlipidemia Assessment & Plan: Well controlled.  No changes to medicines. Continue crestor  40 mg before bed.  Continue to work on eating a healthy diet and exercise.  Labs drawn today.    Orders: -     Comprehensive metabolic panel with GFR -     Lipid panel  Encounter for immunization -     Varicella-zoster vaccine IM  Presence of permanent cardiac pacemaker Assessment & Plan: Management per specialist.        No orders of the defined types were placed in this encounter.   Orders Placed This Encounter  Procedures   Varicella-zoster vaccine IM   Comprehensive metabolic panel with GFR   Lipid panel     Follow-up: Return in about 3 months (around 05/16/2024) for chronic follow up.   I,Marla I Leal-Borjas,acting as a scribe for Abigail Free, MD.,have documented all relevant documentation on the behalf of Abigail Free, MD,as directed by  Abigail Free, MD while in the presence of Abigail Free, MD.   An After Visit Summary was printed and given to the patient.  I attest that I have reviewed this visit and agree with the plan scribed by my staff.   Abigail Free, MD [facility] [phone]

## 2024-02-14 ENCOUNTER — Encounter: Payer: Self-pay | Admitting: Family Medicine

## 2024-02-14 ENCOUNTER — Ambulatory Visit: Admitting: Family Medicine

## 2024-02-14 VITALS — BP 128/72 | HR 81 | Temp 97.8°F | Resp 16 | Ht 63.0 in | Wt 106.0 lb

## 2024-02-14 DIAGNOSIS — Z23 Encounter for immunization: Secondary | ICD-10-CM | POA: Diagnosis not present

## 2024-02-14 DIAGNOSIS — E782 Mixed hyperlipidemia: Secondary | ICD-10-CM | POA: Diagnosis not present

## 2024-02-14 DIAGNOSIS — F17219 Nicotine dependence, cigarettes, with unspecified nicotine-induced disorders: Secondary | ICD-10-CM

## 2024-02-14 DIAGNOSIS — Z950 Presence of cardiac pacemaker: Secondary | ICD-10-CM

## 2024-02-14 DIAGNOSIS — F330 Major depressive disorder, recurrent, mild: Secondary | ICD-10-CM

## 2024-02-14 DIAGNOSIS — J410 Simple chronic bronchitis: Secondary | ICD-10-CM

## 2024-02-14 DIAGNOSIS — F331 Major depressive disorder, recurrent, moderate: Secondary | ICD-10-CM

## 2024-02-14 NOTE — Assessment & Plan Note (Signed)
 Improving. Continue breztri  2 puffs twice daily. Albuterol  as needed.

## 2024-02-14 NOTE — Assessment & Plan Note (Signed)
 Well controlled.  No changes to medicines. Continue crestor 40 mg before bed.  Continue to work on eating a healthy diet and exercise.  Labs drawn today.

## 2024-02-14 NOTE — Assessment & Plan Note (Signed)
 Encouragement. Patient has cut down and is feeling better.

## 2024-02-14 NOTE — Assessment & Plan Note (Signed)
 The current medical regimen is effective;  continue present plan and medications. - Continue current medications. Lexapro  and lorazepam .

## 2024-02-15 ENCOUNTER — Ambulatory Visit: Payer: Self-pay | Admitting: Family Medicine

## 2024-02-15 LAB — COMPREHENSIVE METABOLIC PANEL WITH GFR
ALT: 22 IU/L (ref 0–32)
AST: 28 IU/L (ref 0–40)
Albumin: 4.3 g/dL (ref 3.9–4.9)
Alkaline Phosphatase: 90 IU/L (ref 44–121)
BUN/Creatinine Ratio: 10 — ABNORMAL LOW (ref 12–28)
BUN: 9 mg/dL (ref 8–27)
Bilirubin Total: 0.2 mg/dL (ref 0.0–1.2)
CO2: 22 mmol/L (ref 20–29)
Calcium: 9.5 mg/dL (ref 8.7–10.3)
Chloride: 106 mmol/L (ref 96–106)
Creatinine, Ser: 0.92 mg/dL (ref 0.57–1.00)
Globulin, Total: 1.9 g/dL (ref 1.5–4.5)
Glucose: 110 mg/dL — ABNORMAL HIGH (ref 70–99)
Potassium: 5.1 mmol/L (ref 3.5–5.2)
Sodium: 143 mmol/L (ref 134–144)
Total Protein: 6.2 g/dL (ref 6.0–8.5)
eGFR: 71 mL/min/{1.73_m2} (ref >59)

## 2024-02-15 LAB — LIPID PANEL
Chol/HDL Ratio: 2.8 ratio (ref 0.0–4.4)
Cholesterol, Total: 143 mg/dL (ref 100–199)
HDL: 52 mg/dL (ref >39)
LDL Chol Calc (NIH): 78 mg/dL (ref 0–99)
Triglycerides: 65 mg/dL (ref 0–149)
VLDL Cholesterol Cal: 13 mg/dL (ref 5–40)

## 2024-02-16 ENCOUNTER — Other Ambulatory Visit: Payer: Self-pay

## 2024-02-16 NOTE — Telephone Encounter (Signed)
 Per Ira Mann in the lab stated A1C can not be added to patients lab work, no purple top was drawn.

## 2024-02-19 NOTE — Assessment & Plan Note (Signed)
 Management per specialist.

## 2024-02-24 ENCOUNTER — Encounter: Payer: Self-pay | Admitting: Cardiology

## 2024-02-24 ENCOUNTER — Ambulatory Visit: Attending: Cardiology | Admitting: Cardiology

## 2024-02-24 VITALS — BP 120/66 | HR 76 | Ht 63.0 in | Wt 105.0 lb

## 2024-02-24 DIAGNOSIS — Z950 Presence of cardiac pacemaker: Secondary | ICD-10-CM | POA: Insufficient documentation

## 2024-02-24 DIAGNOSIS — I6529 Occlusion and stenosis of unspecified carotid artery: Secondary | ICD-10-CM | POA: Insufficient documentation

## 2024-02-24 DIAGNOSIS — F17210 Nicotine dependence, cigarettes, uncomplicated: Secondary | ICD-10-CM | POA: Diagnosis not present

## 2024-02-24 DIAGNOSIS — E782 Mixed hyperlipidemia: Secondary | ICD-10-CM | POA: Diagnosis not present

## 2024-02-24 NOTE — Progress Notes (Signed)
 Cardiology Office Note:    Date:  02/24/2024   ID:  Leslie Franklin, DOB [birth year], MRN [id number]  PCP:  Sherre Clapper, MD  Cardiologist:  Jennifer JONELLE Crape, MD   Referring MD: Sherre Clapper, MD    ASSESSMENT:    1. Carotid atherosclerosis, unspecified laterality   2. Presence of permanent cardiac pacemaker   3. Mixed hyperlipidemia   4. Cigarette smoker    PLAN:    In order of problems listed above:  Atherosclerotic vascular disease: Secondary prevention stressed with the patient.  Importance of compliance with diet medication stressed and she vocalized understanding.  She was advised to walk at least half an hour a day on a daily basis. Mixed dyslipidemia: Lipid-lowering medications followed by primary care.  Lipids are close to goal.  I discussed diet and excise. Cigarette smoker: I spent 5 minutes with the patient discussing solely about smoking. Smoking cessation was counseled. I suggested to the patient also different medications and pharmacological interventions. Patient is keen to try stopping on its own at this time. He will get back to me if he needs any further assistance in this matter. Permanent pacemaker: Followed by electrophysiology colleagues.  Their documentation noted. Patient will be seen in follow-up appointment in 9 months or earlier if the patient has any concerns.    Medication Adjustments/Labs and Tests Ordered: Current medicines are reviewed at length with the patient today.  Concerns regarding medicines are outlined above.  No orders of the defined types were placed in this encounter.  No orders of the defined types were placed in this encounter.    No chief complaint on file.    History of Present Illness:    Leslie Franklin is a 63 y.o. female.  Patient has past medical history of carotid atherosclerosis, presence of permanent pacemaker, mixed dyslipidemia and unfortunately continues to smoke.  She walks on a regular basis.  She is asymptomatic with  exercise.  No chest pain orthopnea or PND.  At the time of my evaluation, the patient is alert awake oriented and in no distress.  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   Presence of permanent cardiac pacemaker 11/20/2020   Psychophysiological insomnia 05/04/2023   Screening for lung cancer 04/08/2023   Serum potassium elevated 08/21/2023   Severe protein-calorie malnutrition (HCC) 05/04/2023   Stroke (HCC)    Vaccine refused by patient 08/03/2022   Shingrix , Flu, Covid, Tdap     Vitamin D  deficiency 08/03/2022    Past Surgical History:  Procedure Laterality Date   CATARACT EXTRACTION BILATERAL W/ ANTERIOR VITRECTOMY Bilateral 11/2021   IR ANGIO INTRA EXTRACRAN SEL COM CAROTID INNOMINATE BILAT MOD SED  04/07/2021   IR ANGIO INTRA EXTRACRAN SEL COM CAROTID INNOMINATE BILAT MOD SED  06/04/2022   IR ANGIO INTRA EXTRACRAN SEL INTERNAL CAROTID UNI R MOD SED  04/21/2021   IR ANGIO VERTEBRAL SEL SUBCLAVIAN INNOMINATE BILAT MOD SED  04/07/2021   IR ANGIO VERTEBRAL SEL VERTEBRAL BILAT MOD SED  06/04/2022   IR ANGIOGRAM FOLLOW UP STUDY  04/21/2021   IR ANGIOGRAM FOLLOW UP STUDY  04/21/2021   IR CT HEAD LTD  04/21/2021   IR RADIOLOGIST EVAL & MGMT  03/12/2021   IR RADIOLOGIST EVAL & MGMT  05/07/2021   IR TRANSCATH/EMBOLIZ  04/21/2021   NO PAST SURGERIES     PACEMAKER IMPLANT N/A 10/21/2020   Procedure: PACEMAKER IMPLANT;  Surgeon:  Waddell Danelle ORN, MD;  Location: [facility];  Service: Cardiovascular;  Laterality: N/A;   RADIOLOGY WITH ANESTHESIA N/A 04/07/2021   Procedure: EMBOLIZATION;  Surgeon: Dolphus Carrion, MD;  Location: [facility] OR;  Service: Radiology;  Laterality: N/A;   RADIOLOGY WITH ANESTHESIA N/A 04/21/2021   Procedure: RADIOLOGY WITH ANESTHESIA EMBOLIZATION;  Surgeon: Dolphus Carrion, MD;  Location: [facility] OR;  Service: Radiology;  Laterality: N/A;   TUBAL LIGATION      Current Medications: Current Meds  Medication Sig   albuterol  (VENTOLIN  HFA) 108 (90 Base) MCG/ACT inhaler Inhale 2 puffs into the lungs every 6 (six) hours as needed for wheezing or shortness of breath.   aspirin  81 MG chewable tablet Chew 81 mg by mouth daily.   Budeson-Glycopyrrol-Formoterol  (BREZTRI  AEROSPHERE) 160-9-4.8 MCG/ACT AERO Inhale 2 puffs into the lungs 2 (two) times daily.   escitalopram  (LEXAPRO ) 20 MG tablet Take 1 tablet (20 mg total) by mouth daily.   LORazepam  (ATIVAN ) 0.5 MG tablet Take 1 tablet (0.5 mg total) by mouth 2 (two) times daily as needed for anxiety.   mirtazapine  (REMERON ) 45 MG tablet TAKE 1 TABLET(45 MG) BY MOUTH AT BEDTIME   nicotine  (NICODERM CQ ) 14 mg/24hr patch Place 1 patch (14 mg total) onto the skin daily.   nicotine  (NICODERM CQ ) 7 mg/24hr patch Place 1 patch (7 mg total) onto the skin daily.   ondansetron  (ZOFRAN ) 4 MG tablet Take 1 tablet (4 mg total) by mouth every 8 (eight) hours as needed for nausea or vomiting.   rosuvastatin  (CRESTOR ) 40 MG tablet Take 1 tablet (40 mg total) by mouth daily.     Allergies:   Buspirone    Social History   Socioeconomic History   Marital status: Single    Spouse name: Not on file   Number of children: 4   Years of education: Not on file   Highest education level: 8th grade  Occupational History   Not on file  Tobacco Use   Smoking status: Every Day    Current packs/day: 0.50    Average packs/day: 1 pack/day for 42.0 years (41.5 ttl pk-yrs)     Types: Cigarettes    Start date: 09/01/1979    Last attempt to quit: 08/31/2020   Smokeless tobacco: Never  Vaping Use   Vaping status: Never Used  Substance and Sexual Activity   Alcohol use: Never   Drug use: Never   Sexual activity: Not Currently    Partners: Male    Birth control/protection: Post-menopausal  Other Topics Concern   Not on file  Social History Narrative   Not on file   Social Drivers of Health   Financial Resource Strain: Low Risk  (02/13/2024)   Overall Financial Resource Strain (CARDIA)    Difficulty of Paying Living Expenses: Not very hard  Food Insecurity: No Food Insecurity (02/13/2024)   Hunger Vital Sign    Worried About Running Out of Food in the Last Year: Never true    Ran Out of Food in the Last Year: Never true  Transportation Needs: No Transportation Needs (02/13/2024)  PRAPARE - Administrator, Civil Service (Medical): No    Lack of Transportation (Non-Medical): No  Physical Activity: Sufficiently Active (02/13/2024)   Exercise Vital Sign    Days of Exercise per Week: 7 days    Minutes of Exercise per Session: 30 min  Stress: No Stress Concern Present (02/13/2024)   [employer] of Occupational Health - Occupational Stress Questionnaire    Feeling of Stress: Only a little  Social Connections: Socially Isolated (02/13/2024)   Social Connection and Isolation Panel    Frequency of Communication with Friends and Family: Three times a week    Frequency of Social Gatherings with Friends and Family: Once a week    Attends Religious Services: Never    Database administrator or Organizations: No    Attends Engineer, structural: Not on file    Marital Status: Divorced     Family History: The patient's family history includes Breast cancer in her daughter; Cancer in her daughter; Diabetes in her mother; Emphysema in her sister; Heart attack in her father; Hypertension in her mother; Stroke in her brother and father.  ROS:    Please see the history of present illness.    All other systems reviewed and are negative.  EKGs/Labs/Other Studies Reviewed:    The following studies were reviewed today: I discussed my findings with the patient   Recent Labs: 04/06/2023: TSH 2.010 11/09/2023: Hemoglobin 14.7; Platelets 253 02/14/2024: ALT 22; BUN 9; Creatinine, Ser 0.92; Potassium 5.1; Sodium 143  Recent Lipid Panel    Component Value Date/Time   CHOL 143 02/14/2024 0750   TRIG 65 02/14/2024 0750   HDL 52 02/14/2024 0750   CHOLHDL 2.8 02/14/2024 0750   LDLCALC 78 02/14/2024 0750    Physical Exam:    VS:  BP 120/66   Pulse 76   Ht 5' 3 (1.6 m)   Wt 105 lb (47.6 kg)   SpO2 96%   BMI 18.60 kg/m     Wt Readings from Last 3 Encounters:  02/24/24 105 lb (47.6 kg)  02/14/24 106 lb (48.1 kg)  01/31/24 105 lb (47.6 kg)     GEN: Patient is in no acute distress HEENT: Normal NECK: No JVD; No carotid bruits LYMPHATICS: No lymphadenopathy CARDIAC: Hear sounds regular, 2/6 systolic murmur at the apex. RESPIRATORY:  Clear to auscultation without rales, wheezing or rhonchi  ABDOMEN: Soft, non-tender, non-distended MUSCULOSKELETAL:  No edema; No deformity  SKIN: Warm and dry NEUROLOGIC:  Alert and oriented x 3 PSYCHIATRIC:  Normal affect   Signed, Jennifer JONELLE Crape, MD  02/24/2024 9:21 AM    [facility]

## 2024-02-24 NOTE — Patient Instructions (Signed)

## 2024-02-25 ENCOUNTER — Encounter (HOSPITAL_COMMUNITY): Payer: Self-pay | Admitting: Interventional Radiology

## 2024-03-02 NOTE — Progress Notes (Signed)
 Remote pacemaker transmission.

## 2024-03-02 NOTE — Addendum Note (Signed)
 Addended by: TAWNI DRILLING D on: 03/02/2024 10:17 AM   Modules accepted: Orders

## 2024-03-30 ENCOUNTER — Other Ambulatory Visit: Payer: Self-pay | Admitting: Family Medicine

## 2024-03-30 DIAGNOSIS — E43 Unspecified severe protein-calorie malnutrition: Secondary | ICD-10-CM

## 2024-04-12 ENCOUNTER — Other Ambulatory Visit: Payer: Self-pay | Admitting: Family Medicine

## 2024-04-12 DIAGNOSIS — F411 Generalized anxiety disorder: Secondary | ICD-10-CM

## 2024-04-17 ENCOUNTER — Ambulatory Visit (INDEPENDENT_AMBULATORY_CARE_PROVIDER_SITE_OTHER): Payer: Medicaid Other

## 2024-04-17 DIAGNOSIS — I442 Atrioventricular block, complete: Secondary | ICD-10-CM

## 2024-04-19 LAB — CUP PACEART REMOTE DEVICE CHECK
Date Time Interrogation Session: 20250818115629
Implantable Lead Connection Status: 753985
Implantable Lead Connection Status: 753985
Implantable Lead Implant Date: 20220221
Implantable Lead Implant Date: 20220221
Implantable Lead Location: 753859
Implantable Lead Location: 753860
Implantable Lead Model: 377171
Implantable Lead Model: 377171
Implantable Lead Serial Number: 8000187789
Implantable Lead Serial Number: 8000197459
Implantable Pulse Generator Implant Date: 20220221
Pulse Gen Model: 407145
Pulse Gen Serial Number: 70053960

## 2024-04-20 ENCOUNTER — Ambulatory Visit: Payer: Self-pay | Admitting: Cardiology

## 2024-05-03 ENCOUNTER — Other Ambulatory Visit: Payer: Self-pay

## 2024-05-03 ENCOUNTER — Ambulatory Visit

## 2024-05-03 ENCOUNTER — Ambulatory Visit: Payer: Self-pay | Admitting: Neurology

## 2024-05-03 DIAGNOSIS — I6521 Occlusion and stenosis of right carotid artery: Secondary | ICD-10-CM

## 2024-05-03 DIAGNOSIS — Z8679 Personal history of other diseases of the circulatory system: Secondary | ICD-10-CM

## 2024-05-03 DIAGNOSIS — I679 Cerebrovascular disease, unspecified: Secondary | ICD-10-CM

## 2024-05-03 DIAGNOSIS — I671 Cerebral aneurysm, nonruptured: Secondary | ICD-10-CM

## 2024-05-03 NOTE — Progress Notes (Signed)
 Telephone call from [facility] please change order for Carotid U/S to [facility] U/S.  Order updated.

## 2024-05-12 ENCOUNTER — Other Ambulatory Visit: Payer: Self-pay | Admitting: Family Medicine

## 2024-05-12 DIAGNOSIS — Z1231 Encounter for screening mammogram for malignant neoplasm of breast: Secondary | ICD-10-CM

## 2024-05-17 ENCOUNTER — Inpatient Hospital Stay: Admission: RE | Admit: 2024-05-17 | Payer: Medicaid Other | Source: Ambulatory Visit

## 2024-05-24 NOTE — Progress Notes (Signed)
 Remote PPM Transmission

## 2024-05-25 ENCOUNTER — Ambulatory Visit: Admitting: Family Medicine

## 2024-05-28 NOTE — Progress Notes (Signed)
 Subjective:  Patient ID: Leslie Franklin, female    DOB: 1961-05-18  Age: 63 y.o. MRN: [id number]  No chief complaint on file.   HPI: Discussed the use of AI scribe software for clinical note transcription with the patient, who gave verbal consent to proceed.  History of Present Illness        02/14/2024    8:19 AM 11/09/2023    8:59 AM 08/05/2023    1:51 PM 04/06/2023    8:39 AM 12/14/2022   11:48 AM  Depression screen PHQ 2/9  Decreased Interest 0 1 3 2    Down, Depressed, Hopeless 0 1 2 2    PHQ - 2 Score 0 2 5 4    Altered sleeping 0 0 0 2 1  Tired, decreased energy 0 2 0 2 1  Change in appetite 1 1 0 3 3  Feeling bad or failure about yourself  0 0 0 0 0  Trouble concentrating 0 1 3 0 1  Moving slowly or fidgety/restless 0 0 2 0 1  Suicidal thoughts 0 0 0 0 0  PHQ-9 Score 1 6 10 11    Difficult doing work/chores Not difficult at all Not difficult at all Not difficult at all Somewhat difficult Very difficult        01/31/2024    9:06 AM  Fall Risk   Falls in the past year? 0  Number falls in past yr: 0  Injury with Fall? 0  Follow up Falls evaluation completed    Patient Care Team: Sherre Clapper, MD as PCP - General (Family Medicine) Inocencio Soyla Lunger, MD as PCP - Electrophysiology (Cardiology) Revankar, Jennifer SAUNDERS, MD as PCP - Cardiology (Cardiology) Sherre Clapper, MD as Referring Physician (Family Medicine) Skeet Juliene SAUNDERS, DO as Consulting Physician (Neurology)   Review of Systems  Current Outpatient Medications on File Prior to Visit  Medication Sig Dispense Refill   albuterol  (VENTOLIN  HFA) 108 (90 Base) MCG/ACT inhaler Inhale 2 puffs into the lungs every 6 (six) hours as needed for wheezing or shortness of breath. 8 g 2   aspirin  81 MG chewable tablet Chew 81 mg by mouth daily.     Budeson-Glycopyrrol-Formoterol  (BREZTRI  AEROSPHERE) 160-9-4.8 MCG/ACT AERO Inhale 2 puffs into the lungs 2 (two) times daily. 3 each 3   escitalopram  (LEXAPRO ) 20 MG tablet Take 1 tablet  (20 mg total) by mouth daily. 90 tablet 1   LORazepam  (ATIVAN ) 0.5 MG tablet TAKE 1 TABLET(0.5 MG) BY MOUTH TWICE DAILY AS NEEDED FOR ANXIETY 60 tablet 2   mirtazapine  (REMERON ) 45 MG tablet TAKE 1 TABLET(45 MG) BY MOUTH AT BEDTIME 90 tablet 0   nicotine  (NICODERM CQ ) 14 mg/24hr patch Place 1 patch (14 mg total) onto the skin daily. 28 patch 0   nicotine  (NICODERM CQ ) 7 mg/24hr patch Place 1 patch (7 mg total) onto the skin daily. 28 patch 0   ondansetron  (ZOFRAN ) 4 MG tablet Take 1 tablet (4 mg total) by mouth every 8 (eight) hours as needed for nausea or vomiting. 30 tablet 1   rosuvastatin  (CRESTOR ) 40 MG tablet Take 1 tablet (40 mg total) by mouth daily. 90 tablet 3   No current facility-administered medications on file prior to visit.   Past Medical History:  Diagnosis Date   Anxiety 09/13/2020   Body mass index (BMI) less than 16.5 12/18/2022   Brain aneurysm 04/21/2021   Cardiac murmur 09/18/2020   Carotid atherosclerosis 02/26/2021   Cerebral aneurysm 04/07/2021   Cigarette nicotine  dependence with nicotine -induced  disorder 09/18/2020   COPD (chronic obstructive pulmonary disease) (HCC)    Encounter for immunization 11/14/2023   Ex-smoker 09/18/2020   GAD (generalized anxiety disorder) 09/13/2020   Grief reaction 05/04/2023   Hyperlipidemia    Leucocytosis 05/04/2023   Mild recurrent major depression 12/18/2022   Presence of permanent cardiac pacemaker 11/20/2020   Psychophysiological insomnia 05/04/2023   Screening for lung cancer 04/08/2023   Serum potassium elevated 08/21/2023   Severe protein-calorie malnutrition 05/04/2023   Stroke (HCC)    Vaccine refused by patient 08/03/2022   Shingrix , Flu, Covid, Tdap     Vitamin D  deficiency 08/03/2022   Past Surgical History:  Procedure Laterality Date   CATARACT EXTRACTION BILATERAL W/ ANTERIOR VITRECTOMY Bilateral 11/2021   IR ANGIO INTRA EXTRACRAN SEL COM CAROTID INNOMINATE BILAT MOD SED  04/07/2021   IR ANGIO INTRA  EXTRACRAN SEL COM CAROTID INNOMINATE BILAT MOD SED  06/04/2022   IR ANGIO INTRA EXTRACRAN SEL INTERNAL CAROTID UNI R MOD SED  04/21/2021   IR ANGIO VERTEBRAL SEL SUBCLAVIAN INNOMINATE BILAT MOD SED  04/07/2021   IR ANGIO VERTEBRAL SEL VERTEBRAL BILAT MOD SED  06/04/2022   IR ANGIOGRAM FOLLOW UP STUDY  04/21/2021   IR ANGIOGRAM FOLLOW UP STUDY  04/21/2021   IR CT HEAD LTD  04/21/2021   IR RADIOLOGIST EVAL & MGMT  03/12/2021   IR RADIOLOGIST EVAL & MGMT  05/07/2021   IR TRANSCATH/EMBOLIZ  04/21/2021   NO PAST SURGERIES     PACEMAKER IMPLANT N/A 10/21/2020   Procedure: PACEMAKER IMPLANT;  Surgeon: Waddell Danelle ORN, MD;  Location: [facility];  Service: Cardiovascular;  Laterality: N/A;   RADIOLOGY WITH ANESTHESIA N/A 04/07/2021   Procedure: EMBOLIZATION;  Surgeon: Dolphus Carrion, MD;  Location: [facility] OR;  Service: Radiology;  Laterality: N/A;   RADIOLOGY WITH ANESTHESIA N/A 04/21/2021   Procedure: RADIOLOGY WITH ANESTHESIA EMBOLIZATION;  Surgeon: Dolphus Carrion, MD;  Location: [facility] OR;  Service: Radiology;  Laterality: N/A;   TUBAL LIGATION      Family History  Problem Relation Age of Onset   Hypertension Mother    Diabetes Mother    Heart attack Father    Stroke Father    Emphysema Sister    Stroke Brother    Cancer Daughter        Breast (Braca 2 gene)   Breast cancer Daughter    Social History   Socioeconomic History   Marital status: Single    Spouse name: Not on file   Number of children: 4   Years of education: Not on file   Highest education level: 8th grade  Occupational History   Not on file  Tobacco Use   Smoking status: Every Day    Current packs/day: 0.50    Average packs/day: 1 pack/day for 42.2 years (41.6 ttl pk-yrs)    Types: Cigarettes    Start date: 09/01/1979    Last attempt to quit: 08/31/2020   Smokeless tobacco: Never  Vaping Use   Vaping status: Never Used  Substance and Sexual Activity   Alcohol use: Never   Drug use: Never   Sexual  activity: Not Currently    Partners: Male    Birth control/protection: Post-menopausal  Other Topics Concern   Not on file  Social History Narrative   Not on file   Social Drivers of Health   Financial Resource Strain: Low Risk  (02/13/2024)   Overall Financial Resource Strain (CARDIA)    Difficulty of Paying Living Expenses: Not very  hard  Food Insecurity: No Food Insecurity (02/13/2024)   Hunger Vital Sign    Worried About Running Out of Food in the Last Year: Never true    Ran Out of Food in the Last Year: Never true  Transportation Needs: No Transportation Needs (02/13/2024)   PRAPARE - Administrator, Civil Service (Medical): No    Lack of Transportation (Non-Medical): No  Physical Activity: Sufficiently Active (02/13/2024)   Exercise Vital Sign    Days of Exercise per Week: 7 days    Minutes of Exercise per Session: 30 min  Stress: No Stress Concern Present (02/13/2024)   [employer] of Occupational Health - Occupational Stress Questionnaire    Feeling of Stress: Only a little  Social Connections: Socially Isolated (02/13/2024)   Social Connection and Isolation Panel    Frequency of Communication with Friends and Family: Three times a week    Frequency of Social Gatherings with Friends and Family: Once a week    Attends Religious Services: Never    Database administrator or Organizations: No    Attends Engineer, structural: Not on file    Marital Status: Divorced    Objective:  There were no vitals taken for this visit.     02/24/2024    8:43 AM 02/14/2024    7:31 AM 01/31/2024    9:06 AM  BP/Weight  Systolic BP 120 128 119  Diastolic BP 66 72 59  Wt. (Lbs) 105 106 105  BMI 18.6 kg/m2 18.78 kg/m2 18.6 kg/m2    Physical Exam  {Perform Simple Foot Exam  Perform Detailed exam:1} {Insert foot Exam (Optional):30965}   Lab Results  Component Value Date   WBC 7.4 11/09/2023   HGB 14.7 11/09/2023   HCT 45.9 11/09/2023   PLT 253 11/09/2023    GLUCOSE 110 (H) 02/14/2024   CHOL 143 02/14/2024   TRIG 65 02/14/2024   HDL 52 02/14/2024   LDLCALC 78 02/14/2024   ALT 22 02/14/2024   AST 28 02/14/2024   NA 143 02/14/2024   K 5.1 02/14/2024   CL 106 02/14/2024   CREATININE 0.92 02/14/2024   BUN 9 02/14/2024   CO2 22 02/14/2024   TSH 2.010 04/06/2023   INR 1.0 06/04/2022    Results for orders placed or performed in visit on 04/17/24  CUP PACEART REMOTE DEVICE CHECK   Collection Time: 04/17/24 11:56 AM  Result Value Ref Range   Pulse Generator Manufacturer BITR    Date Time Interrogation Session [id number]    Pulse Gen Model [id number] Edora 8 DR-T    Pulse Gen Serial Number [id number]    Clinic Name [facility]    Implantable Pulse Generator Type Implantable Pulse Generator    Implantable Pulse Generator Implant Date [id number]    Implantable Lead Manufacturer BITR    Implantable Lead Model [id number] Linox Smart ProMRI SD 75/18    Implantable Lead Serial Number [id number]    Implantable Lead Implant Date 79779778    Implantable Lead Location Detail 1 UNKNOWN    Implantable Lead Location [id number]    Implantable Lead Connection Status [id number]    Implantable Lead Manufacturer [employer]    Implantable Lead Model [id number] Linox Smart ProMRI SD 75/18    Implantable Lead Serial Number [id number]    Implantable Lead Implant Date 79779778    Implantable Lead Location Detail 1 UNKNOWN    Implantable Lead Location [id number]    Implantable Lead Connection Status [id number]   .  Assessment &  Plan:   Assessment & Plan Mixed hyperlipidemia     Simple chronic bronchitis (HCC)     Cigarette nicotine  dependence with nicotine -induced disorder     Vitamin D  deficiency     Elevated glucose       There is no height or weight on file to calculate BMI.  Assessment and Plan Assessment & Plan      No orders of the defined types were placed in this encounter.   No orders of the defined types were placed in this encounter.       Follow-up: No follow-ups on file.  An After Visit Summary was printed and given to the patient.  Abigail Free, MD [facility] [phone]

## 2024-05-28 NOTE — Assessment & Plan Note (Signed)
 SABRA

## 2024-05-28 NOTE — Assessment & Plan Note (Signed)
 Leslie Franklin

## 2024-05-30 ENCOUNTER — Ambulatory Visit: Admitting: Family Medicine

## 2024-05-30 ENCOUNTER — Encounter: Payer: Self-pay | Admitting: Family Medicine

## 2024-05-30 VITALS — BP 122/80 | HR 87 | Temp 98.2°F | Ht 63.0 in | Wt 111.0 lb

## 2024-05-30 DIAGNOSIS — Z23 Encounter for immunization: Secondary | ICD-10-CM

## 2024-05-30 DIAGNOSIS — J410 Simple chronic bronchitis: Secondary | ICD-10-CM | POA: Diagnosis not present

## 2024-05-30 DIAGNOSIS — F17219 Nicotine dependence, cigarettes, with unspecified nicotine-induced disorders: Secondary | ICD-10-CM | POA: Diagnosis not present

## 2024-05-30 DIAGNOSIS — R7309 Other abnormal glucose: Secondary | ICD-10-CM | POA: Insufficient documentation

## 2024-05-30 DIAGNOSIS — F411 Generalized anxiety disorder: Secondary | ICD-10-CM | POA: Diagnosis not present

## 2024-05-30 DIAGNOSIS — E559 Vitamin D deficiency, unspecified: Secondary | ICD-10-CM

## 2024-05-30 DIAGNOSIS — I63233 Cerebral infarction due to unspecified occlusion or stenosis of bilateral carotid arteries: Secondary | ICD-10-CM | POA: Insufficient documentation

## 2024-05-30 DIAGNOSIS — F330 Major depressive disorder, recurrent, mild: Secondary | ICD-10-CM | POA: Diagnosis not present

## 2024-05-30 DIAGNOSIS — R42 Dizziness and giddiness: Secondary | ICD-10-CM | POA: Insufficient documentation

## 2024-05-30 DIAGNOSIS — E782 Mixed hyperlipidemia: Secondary | ICD-10-CM

## 2024-05-30 LAB — POCT LIPID PANEL
HDL: 48
LDL: 96
Non-HDL: 109
TC: 157
TRG: 63

## 2024-05-30 LAB — POCT GLYCOSYLATED HEMOGLOBIN (HGB A1C): HbA1c POC (<> result, manual entry): 5.6 % (ref 4.0–5.6)

## 2024-05-30 MED ORDER — BUPROPION HCL ER (XL) 150 MG PO TB24: ORAL_TABLET | ORAL | 0 refills | Status: DC

## 2024-05-30 MED ORDER — EZETIMIBE 10 MG PO TABS: 10.0000 mg | ORAL_TABLET | Freq: Every day | ORAL | 0 refills | Status: DC

## 2024-05-30 MED ORDER — MECLIZINE HCL 25 MG PO TABS: 25.0000 mg | ORAL_TABLET | Freq: Three times a day (TID) | ORAL | 0 refills | Status: AC | PRN

## 2024-05-30 NOTE — Assessment & Plan Note (Addendum)
 Symptoms stable on Lexapro  and mirtazapine .

## 2024-05-30 NOTE — Patient Instructions (Addendum)
 VISIT SUMMARY: During your visit, we addressed your dizziness, smoking cessation, and other ongoing health concerns. We prescribed medications and ordered tests to help manage your symptoms and improve your overall health.  YOUR PLAN: DIZZINESS: You have been experiencing intermittent dizziness without any associated ear symptoms or neurological deficits. -Take meclizine 25 mg up to three times daily as needed for dizziness. -We ordered blood tests to check your potassium level, blood count, and thyroid  function.  NICOTINE  DEPENDENCE, CIGARETTES: You are currently smoking five cigarettes per day and we discussed using Wellbutrin and nicotine  patches to help you quit. -Start taking Wellbutrin 150 mg daily for one week, then increase to 300 mg daily. -Begin using nicotine  patches two weeks after starting Wellbutrin. -We will follow up in four weeks to check on your progress with smoking cessation.  CHRONIC OBSTRUCTIVE PULMONARY DISEASE (COPD): Your COPD is well-managed with your current medication, Breztri . -Continue taking Breztri  as prescribed.  CAROTID ARTERY STENOSIS WITH CEREBRAL ANEURYSM: You are under the care of a vascular surgeon for this condition and have no new neurological symptoms. -Continue with your current treatment and follow up with your vascular surgeon as needed.  MIXED HYPERLIPIDEMIA: Your LDL cholesterol level is 96 mg/dL, and we aim to lower it further due to your carotid artery stenosis. -Start taking Zetia to help lower your LDL cholesterol levels.  DEPRESSION AND ANXIETY: Your symptoms are stable with your current medications, Lexapro  and mirtazapine . -Continue taking Lexapro  and mirtazapine  as prescribed.  GENERAL HEALTH MAINTENANCE: You received your flu vaccination and we discussed the importance of quitting smoking for your overall health. -Continue efforts to quit smoking for better health. -Follow up in four weeks to monitor your progress and adjust  medications as needed. -Complete the blood work as ordered.                      Contains text generated by [employer].                                 Contains text generated by [employer].

## 2024-05-30 NOTE — Assessment & Plan Note (Signed)
 Intermittent dizziness likely benign. No positional vertigo or ear symptoms. No neurological deficits. - Prescribed meclizine 25 mg up to three times daily as needed. - Ordered blood tests for potassium level, blood count, and thyroid  function. Orders:   meclizine (ANTIVERT) 25 MG tablet; Take 1 tablet (25 mg total) by mouth 3 (three) times daily as needed for dizziness.

## 2024-05-30 NOTE — Assessment & Plan Note (Addendum)
 A1c 5.6 . No prediabetes. Orders:   POCT glycosylated hemoglobin (Hb A1C)

## 2024-05-31 ENCOUNTER — Ambulatory Visit: Payer: Self-pay | Admitting: Family Medicine

## 2024-05-31 LAB — CBC WITH DIFFERENTIAL/PLATELET
Basophils Absolute: 0.1 x10E3/uL (ref 0.0–0.2)
Basos: 1 %
EOS (ABSOLUTE): 0.2 x10E3/uL (ref 0.0–0.4)
Eos: 3 %
Hematocrit: 45.8 % (ref 34.0–46.6)
Hemoglobin: 14.9 g/dL (ref 11.1–15.9)
Immature Grans (Abs): 0 x10E3/uL (ref 0.0–0.1)
Immature Granulocytes: 0 %
Lymphocytes Absolute: 2.4 x10E3/uL (ref 0.7–3.1)
Lymphs: 25 %
MCH: 30.7 pg (ref 26.6–33.0)
MCHC: 32.5 g/dL (ref 31.5–35.7)
MCV: 94 fL (ref 79–97)
Monocytes Absolute: 0.6 x10E3/uL (ref 0.1–0.9)
Monocytes: 6 %
Neutrophils Absolute: 6.1 x10E3/uL (ref 1.4–7.0)
Neutrophils: 65 %
Platelets: 221 x10E3/uL (ref 150–450)
RBC: 4.86 x10E6/uL (ref 3.77–5.28)
RDW: 12.1 % (ref 11.7–15.4)
WBC: 9.4 x10E3/uL (ref 3.4–10.8)

## 2024-05-31 LAB — COMPREHENSIVE METABOLIC PANEL WITH GFR
ALT: 21 IU/L (ref 0–32)
AST: 29 IU/L (ref 0–40)
Albumin: 4.4 g/dL (ref 3.9–4.9)
Alkaline Phosphatase: 98 IU/L (ref 49–135)
BUN/Creatinine Ratio: 23 (ref 12–28)
BUN: 20 mg/dL (ref 8–27)
Bilirubin Total: <0.2 mg/dL (ref 0.0–1.2)
CO2: 20 mmol/L (ref 20–29)
Calcium: 9.5 mg/dL (ref 8.7–10.3)
Chloride: 108 mmol/L — ABNORMAL HIGH (ref 96–106)
Creatinine, Ser: 0.88 mg/dL (ref 0.57–1.00)
Globulin, Total: 2.2 g/dL (ref 1.5–4.5)
Glucose: 92 mg/dL (ref 70–99)
Potassium: 5.3 mmol/L — ABNORMAL HIGH (ref 3.5–5.2)
Sodium: 145 mmol/L — ABNORMAL HIGH (ref 134–144)
Total Protein: 6.6 g/dL (ref 6.0–8.5)
eGFR: 75 mL/min/1.73 (ref >59)

## 2024-05-31 LAB — TSH: TSH: 1.22 u[IU]/mL (ref 0.450–4.500)

## 2024-06-05 NOTE — Progress Notes (Unsigned)
 [facility] [place]  ASSESSMENT / PLAN: 63 y.o. female with bilateral asymptomatic bilateral carotid artery stenosis.  Recommend:  Abstinence from all tobacco products. Blood glucose control with goal A1c < 7%. Blood pressure control with goal blood pressure < 130/80 mmHg. Lipid reduction therapy with goal LDL-C < 55 mg/dL. Aspirin  81mg  by mouth daily. Atorvastatin 40-80mg  PO QD (or other high intensity statin therapy).  Follow up 6 months with carotid duplex.   CHIEF COMPLAINT: Carotid duplex shows worsening stenosis  HISTORY OF PRESENT ILLNESS: Leslie Franklin is a 63 y.o. female with asymptomatic carotid artery stenosis, which has been followed by her neurologist, Dr. Skeet.  She has previously undergone endovascular intervention for intracranial aneurysm.  She reports she has suffered a remote stroke with no clinical sequela.  No stroke or TIA in the last 6 months.  We reviewed typical strokelike symptoms.  I encouraged her to present to the hospital should any of these develop.  We reviewed her duplex in detail.  I reviewed the operative strategy for symptomatic and critically stenosed arteries.  Thankfully she is in neither of these categories at present.  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   Presence of permanent cardiac pacemaker 11/20/2020   Psychophysiological insomnia 05/04/2023   Screening for lung  cancer 04/08/2023   Serum potassium elevated 08/21/2023   Severe protein-calorie malnutrition 05/04/2023   Stroke (HCC)    Vaccine refused by patient 08/03/2022   Shingrix , Flu, Covid, Tdap     Vitamin D  deficiency 08/03/2022    Past Surgical History:  Procedure Laterality Date   CATARACT EXTRACTION BILATERAL W/ ANTERIOR VITRECTOMY Bilateral 11/2021   IR ANGIO INTRA EXTRACRAN SEL COM CAROTID INNOMINATE BILAT MOD SED  04/07/2021   IR ANGIO INTRA EXTRACRAN SEL COM CAROTID INNOMINATE BILAT MOD SED  06/04/2022   IR ANGIO INTRA EXTRACRAN SEL INTERNAL CAROTID UNI R MOD SED  04/21/2021   IR ANGIO VERTEBRAL SEL SUBCLAVIAN INNOMINATE BILAT MOD SED  04/07/2021   IR ANGIO VERTEBRAL SEL VERTEBRAL BILAT MOD SED  06/04/2022   IR ANGIOGRAM FOLLOW UP STUDY  04/21/2021   IR ANGIOGRAM FOLLOW UP STUDY  04/21/2021   IR CT HEAD LTD  04/21/2021   IR RADIOLOGIST EVAL & MGMT  03/12/2021   IR RADIOLOGIST EVAL & MGMT  05/07/2021   IR TRANSCATH/EMBOLIZ  04/21/2021   NO PAST SURGERIES     PACEMAKER IMPLANT N/A 10/21/2020   Procedure: PACEMAKER IMPLANT;  Surgeon: Waddell Danelle ORN, MD;  Location: [facility];  Service: Cardiovascular;  Laterality: N/A;   RADIOLOGY WITH ANESTHESIA N/A 04/07/2021   Procedure: EMBOLIZATION;  Surgeon: Dolphus Carrion, MD;  Location: [facility] OR;  Service: Radiology;  Laterality: N/A;   RADIOLOGY WITH ANESTHESIA N/A 04/21/2021   Procedure: RADIOLOGY WITH ANESTHESIA EMBOLIZATION;  Surgeon: Dolphus Carrion, MD;  Location: [facility] OR;  Service: Radiology;  Laterality: N/A;   TUBAL LIGATION      Family History  Problem Relation Age of Onset  Hypertension Mother    Diabetes Mother    Heart attack Father    Stroke Father    Emphysema Sister    Stroke Brother    Cancer Daughter        Breast (Braca 2 gene)   Breast cancer Daughter     Social History   Socioeconomic History   Marital status: Single    Spouse name: Not on file   Number of children: 4   Years of education:  Not on file   Highest education level: 8th grade  Occupational History   Not on file  Tobacco Use   Smoking status: Every Day    Current packs/day: 0.50    Average packs/day: 1 pack/day for 42.3 years (41.6 ttl pk-yrs)    Types: Cigarettes    Start date: 09/01/1979    Last attempt to quit: 08/31/2020   Smokeless tobacco: Never  Vaping Use   Vaping status: Never Used  Substance and Sexual Activity   Alcohol use: Never   Drug use: Never   Sexual activity: Not Currently    Partners: Male    Birth control/protection: Post-menopausal  Other Topics Concern   Not on file  Social History Narrative   Not on file   Social Drivers of Health   Financial Resource Strain: Low Risk  (02/13/2024)   Overall Financial Resource Strain (CARDIA)    Difficulty of Paying Living Expenses: Not very hard  Food Insecurity: No Food Insecurity (02/13/2024)   Hunger Vital Sign    Worried About Running Out of Food in the Last Year: Never true    Ran Out of Food in the Last Year: Never true  Transportation Needs: No Transportation Needs (02/13/2024)   PRAPARE - Administrator, Civil Service (Medical): No    Lack of Transportation (Non-Medical): No  Physical Activity: Sufficiently Active (02/13/2024)   Exercise Vital Sign    Days of Exercise per Week: 7 days    Minutes of Exercise per Session: 30 min  Stress: No Stress Concern Present (02/13/2024)   [employer] of Occupational Health - Occupational Stress Questionnaire    Feeling of Stress: Only a little  Social Connections: Socially Isolated (02/13/2024)   Social Connection and Isolation Panel    Frequency of Communication with Friends and Family: Three times a week    Frequency of Social Gatherings with Friends and Family: Once a week    Attends Religious Services: Never    Database administrator or Organizations: No    Attends Engineer, structural: Not on file    Marital Status: Divorced  Intimate Partner Violence: Not At Risk  (08/19/2023)   Humiliation, Afraid, Rape, and Kick questionnaire    Fear of Current or Ex-Partner: No    Emotionally Abused: No    Physically Abused: No    Sexually Abused: No    Allergies  Allergen Reactions   Buspirone  Nausea Only    Current Outpatient Medications  Medication Sig Dispense Refill   albuterol  (VENTOLIN  HFA) 108 (90 Base) MCG/ACT inhaler Inhale 2 puffs into the lungs every 6 (six) hours as needed for wheezing or shortness of breath. 8 g 2   aspirin  81 MG chewable tablet Chew 81 mg by mouth daily.     Budeson-Glycopyrrol-Formoterol  (BREZTRI  AEROSPHERE) 160-9-4.8 MCG/ACT AERO Inhale 2 puffs into the lungs 2 (two) times daily. 3 each 3   buPROPion (WELLBUTRIN XL) 150 MG 24 hr tablet Take 1 tablet (150 mg total) by mouth daily  for 7 days, THEN 2 tablets (300 mg total) daily for 23 days. 49 tablet 0   escitalopram  (LEXAPRO ) 20 MG tablet Take 1 tablet (20 mg total) by mouth daily. 90 tablet 1   ezetimibe (ZETIA) 10 MG tablet Take 1 tablet (10 mg total) by mouth daily. 90 tablet 0   LORazepam  (ATIVAN ) 0.5 MG tablet TAKE 1 TABLET(0.5 MG) BY MOUTH TWICE DAILY AS NEEDED FOR ANXIETY 60 tablet 2   meclizine (ANTIVERT) 25 MG tablet Take 1 tablet (25 mg total) by mouth 3 (three) times daily as needed for dizziness. 30 tablet 0   mirtazapine  (REMERON ) 45 MG tablet TAKE 1 TABLET(45 MG) BY MOUTH AT BEDTIME 90 tablet 0   ondansetron  (ZOFRAN ) 4 MG tablet Take 1 tablet (4 mg total) by mouth every 8 (eight) hours as needed for nausea or vomiting. 30 tablet 1   rosuvastatin  (CRESTOR ) 40 MG tablet Take 1 tablet (40 mg total) by mouth daily. 90 tablet 3   No current facility-administered medications for this visit.    PHYSICAL EXAM Vitals:   06/06/24 0925 06/06/24 0927  BP: 122/77 (!) 143/80  Pulse: 79   Temp: 98.1 F (36.7 C)   SpO2: 95%   Weight: 111 lb (50.3 kg)   Height: 5' 3 (1.6 m)    Elderly woman in no distress Regular rate and rhythm Unlabored breathing Normal gait and  station No cranial nerve abnormalities No extremity weakness  PERTINENT LABORATORY AND RADIOLOGIC DATA  Most recent CBC    Latest Ref Rng & Units 05/30/2024   10:47 AM 11/09/2023    9:48 AM 08/05/2023    2:11 PM  CBC  WBC 3.4 - 10.8 x10E3/uL 9.4  7.4  10.7   Hemoglobin 11.1 - 15.9 g/dL 85.0  85.2  86.0   Hematocrit 34.0 - 46.6 % 45.8  45.9  43.2   Platelets 150 - 450 x10E3/uL 221  253  274      Most recent CMP    Latest Ref Rng & Units 05/30/2024   10:47 AM 02/14/2024    7:50 AM 11/09/2023    9:48 AM  CMP  Glucose 70 - 99 mg/dL 92  889  91   BUN 8 - 27 mg/dL 20  9  22    Creatinine 0.57 - 1.00 mg/dL 9.11  9.07  8.96   Sodium 134 - 144 mmol/L 145  143  142   Potassium 3.5 - 5.2 mmol/L 5.3  5.1  5.0   Chloride 96 - 106 mmol/L 108  106  106   CO2 20 - 29 mmol/L 20  22  22    Calcium  8.7 - 10.3 mg/dL 9.5  9.5  9.2   Total Protein 6.0 - 8.5 g/dL 6.6  6.2  6.7   Total Bilirubin 0.0 - 1.2 mg/dL <9.7  0.2  <9.7   Alkaline Phos 49 - 135 IU/L 98  90  92   AST 0 - 40 IU/L 29  28  26    ALT 0 - 32 IU/L 21  22  17      Renal function Estimated Creatinine Clearance: 53.3 mL/min (by C-G formula based on SCr of 0.88 mg/dL).  HbA1c POC (<> result, manual entry) (%)  Date Value  05/30/2024 5.6    LDL Chol Calc (NIH)  Date Value Ref Range Status  02/14/2024 78 0 - 99 mg/dL Final    Carotid Arterial Duplex Study   Patient Name:  Leslie Franklin  Date of Exam:   05/03/2024  Medical Rec #: [id number]    Accession #:    [id number]  Date of Birth: September 24, 1960    Patient Gender: F  Patient Age:   14 years  Exam Location:  [facility]  Procedure:      VAS US  CAROTID  Referring Phys: ADAM JAFFE    ---------------------------------------------------------------------------  -----    Indications: Stenosis of right carotid artery [I65.21 (ICD-10-CM)].  Risk Factors: Hyperlipidemia, current smoker, prior CVA.   Performing Technologist: Saddie Chimes     Examination Guidelines: A complete  evaluation includes B-mode imaging,  spectral  Doppler, color Doppler, and power Doppler as needed of all accessible  portions  of each vessel. Bilateral testing is considered an integral part of a  complete  examination. Limited examinations for reoccurring indications may be  performed  as noted.     Right Carotid Findings:  +----------+--------+--------+--------+------------------+-----------------  -+           PSV cm/sEDV cm/sStenosisPlaque DescriptionComments             +----------+--------+--------+--------+------------------+-----------------  -+  CCA Prox  105     28                                                     +----------+--------+--------+--------+------------------+-----------------  -+  CCA Mid   107     35                                                     +----------+--------+--------+--------+------------------+-----------------  -+  CCA Distal96      30                                intimal  thickening  +----------+--------+--------+--------+------------------+-----------------  -+  ICA Prox  226     57      40-59%  smooth            Top range            +----------+--------+--------+--------+------------------+-----------------  -+  ICA Mid   128     52                                                     +----------+--------+--------+--------+------------------+-----------------  -+  ICA Distal120     49                                                     +----------+--------+--------+--------+------------------+-----------------  -+  ECA      95      10                                                     +----------+--------+--------+--------+------------------+-----------------  -+   +----------+--------+-------+----------------+-------------------+  PSV cm/sEDV cmsDescribe        Arm Pressure (mmHG)  +----------+--------+-------+----------------+-------------------+   [id number]           Multiphasic, WNL                     +----------+--------+-------+----------------+-------------------+   +---------+--------+--+--------+--+---------+  VertebralPSV cm/s63EDV cm/s16Antegrade  +---------+--------+--+--------+--+---------+      Left Carotid Findings:  +----------+--------+--------+--------+------------------+--------+           PSV cm/sEDV cm/sStenosisPlaque DescriptionComments  +----------+--------+--------+--------+------------------+--------+  CCA Prox  109     32                                          +----------+--------+--------+--------+------------------+--------+  CCA Mid   110     35                                          +----------+--------+--------+--------+------------------+--------+  CCA Distal96      31                                          +----------+--------+--------+--------+------------------+--------+  ICA Prox  121     39                                          +----------+--------+--------+--------+------------------+--------+  ICA Mid   111     44      40-59%                              +----------+--------+--------+--------+------------------+--------+  ICA Distal103     37                                          +----------+--------+--------+--------+------------------+--------+  ECA      100     13                                          +----------+--------+--------+--------+------------------+--------+   +----------+--------+--------+----------------+-------------------+           PSV cm/sEDV cm/sDescribe        Arm Pressure (mmHG)  +----------+--------+--------+----------------+-------------------+  [id number]            Multiphasic, WNL                     +----------+--------+--------+----------------+-------------------+   +---------+--------+--+--------+--+---------+  VertebralPSV cm/s70EDV  cm/s19Antegrade  +---------+--------+--+--------+--+---------+         Summary:  Right Carotid: Velocities in the right ICA are consistent with a 60-79%                 stenosis.   Left Carotid: Velocities in the left ICA are consistent with a 40-59%  stenosis.   Vertebrals: Bilateral vertebral arteries demonstrate antegrade flow.  Subclavians: Normal flow hemodynamics were seen in bilateral subclavian  arteries.   *See table(s) above for measurements and observations.   Debby SAILOR. Magda, MD FACS [facility] Phone Number: [phone] 06/05/2024 4:42 PM   Total time spent on preparing this encounter including chart review, data review, collecting history, examining the patient, and coordinating care: 45 minutes  Portions of this report may have been transcribed using voice recognition software.  Every effort has been made to ensure accuracy; however, inadvertent computerized transcription errors may still be present.

## 2024-06-06 ENCOUNTER — Encounter: Payer: Self-pay | Admitting: Vascular Surgery

## 2024-06-06 ENCOUNTER — Ambulatory Visit: Attending: Vascular Surgery | Admitting: Vascular Surgery

## 2024-06-06 VITALS — BP 143/80 | HR 79 | Temp 98.1°F | Ht 63.0 in | Wt 111.0 lb

## 2024-06-06 DIAGNOSIS — I6523 Occlusion and stenosis of bilateral carotid arteries: Secondary | ICD-10-CM | POA: Diagnosis present

## 2024-06-08 ENCOUNTER — Other Ambulatory Visit: Payer: Self-pay | Admitting: *Deleted

## 2024-06-08 DIAGNOSIS — I6523 Occlusion and stenosis of bilateral carotid arteries: Secondary | ICD-10-CM

## 2024-06-27 ENCOUNTER — Encounter: Payer: Self-pay | Admitting: Family Medicine

## 2024-06-27 ENCOUNTER — Ambulatory Visit: Admitting: Family Medicine

## 2024-06-27 VITALS — BP 110/70 | HR 92 | Temp 98.0°F | Resp 16 | Ht 63.0 in | Wt 112.6 lb

## 2024-06-27 DIAGNOSIS — E875 Hyperkalemia: Secondary | ICD-10-CM | POA: Diagnosis not present

## 2024-06-27 DIAGNOSIS — R42 Dizziness and giddiness: Secondary | ICD-10-CM | POA: Diagnosis not present

## 2024-06-27 DIAGNOSIS — F17219 Nicotine dependence, cigarettes, with unspecified nicotine-induced disorders: Secondary | ICD-10-CM | POA: Diagnosis not present

## 2024-06-27 NOTE — Progress Notes (Signed)
 Subjective:  Patient ID: Leslie Franklin, female    DOB: [birth year]  Age: 63 y.o. MRN: [id number]  Chief Complaint  Patient presents with   Medical Management of Chronic Issues    Discussed the use of AI scribe software for clinical note transcription with the patient, who gave verbal consent to proceed.  History of Present Illness   Leslie Franklin is a 63 year old female who presents for follow-up on smoking cessation.  Tobacco use and smoking cessation - Currently smokes four cigarettes per day, reduced from five at last visit - Plans to begin nicotine  patches when reduced to two cigarettes per day - Experiences withdrawal symptoms including feeling 'ill fast' and frequent leg shaking  Electrolyte abnormality - Previously elevated potassium levels - Consumes foods high in potassium, including peanut butter, potatoes and bananas  Dizziness and hydration status - Improvement in dizziness - Attributes prior dizziness to possible dehydration - Increasing water intake - No chest pain, shortness of breath, or other concerning symptoms  Psychosocial stressors - Lives with four grandchildren, ages six to 73 - Acknowledges that caregiving responsibilities can be stressful          05/30/2024    9:26 AM 02/14/2024    8:19 AM 11/09/2023    8:59 AM 08/05/2023    1:51 PM 04/06/2023    8:39 AM  Depression screen PHQ 2/9  Decreased Interest 0 0 1 3 2   Down, Depressed, Hopeless 0 0 1 2 2   PHQ - 2 Score 0 0 2 5 4   Altered sleeping 0 0 0 0 2  Tired, decreased energy 0 0 2 0 2  Change in appetite 0 1 1 0 3  Feeling bad or failure about yourself  1 0 0 0 0  Trouble concentrating 0 0 1 3 0  Moving slowly or fidgety/restless 0 0 0 2 0  Suicidal thoughts 0 0 0 0 0  PHQ-9 Score 1 1 6 10 11   Difficult doing work/chores Not difficult at all Not difficult at all Not difficult at all Not difficult at all Somewhat difficult        01/31/2024    9:06 AM  Fall Risk   Falls in the past year? 0   Number falls in past yr: 0  Injury with Fall? 0  Follow up Falls evaluation completed    Patient Care Team: Sherre Clapper, MD as PCP - General (Family Medicine) Inocencio Soyla Lunger, MD as PCP - Electrophysiology (Cardiology) Revankar, Jennifer SAUNDERS, MD as PCP - Cardiology (Cardiology) Sherre Clapper, MD as Referring Physician (Family Medicine) Skeet Juliene SAUNDERS, DO as Consulting Physician (Neurology)   Review of Systems  Constitutional:  Negative for chills, diaphoresis, fatigue and fever.  HENT:  Negative for congestion, ear pain and sinus pain.   Eyes: Negative.   Respiratory:  Negative for cough and shortness of breath.   Cardiovascular:  Negative for chest pain.  Gastrointestinal:  Negative for abdominal pain, constipation, diarrhea, nausea and vomiting.  Endocrine: Negative.   Genitourinary:  Negative for dysuria, frequency and urgency.  Musculoskeletal:  Negative for arthralgias.  Allergic/Immunologic: Negative.   Neurological:  Negative for dizziness, weakness, light-headedness and headaches.  Hematological: Negative.   Psychiatric/Behavioral:  Negative for dysphoric mood. The patient is not nervous/anxious.     Current Outpatient Medications on File Prior to Visit  Medication Sig Dispense Refill   albuterol  (VENTOLIN  HFA) 108 (90 Base) MCG/ACT inhaler Inhale 2 puffs into the lungs every 6 (six) hours as needed  for wheezing or shortness of breath. 8 g 2   aspirin  81 MG chewable tablet Chew 81 mg by mouth daily.     Budeson-Glycopyrrol-Formoterol  (BREZTRI  AEROSPHERE) 160-9-4.8 MCG/ACT AERO Inhale 2 puffs into the lungs 2 (two) times daily. 3 each 3   buPROPion (WELLBUTRIN XL) 150 MG 24 hr tablet Take 1 tablet (150 mg total) by mouth daily for 7 days, THEN 2 tablets (300 mg total) daily for 23 days. 49 tablet 0   escitalopram  (LEXAPRO ) 20 MG tablet Take 1 tablet (20 mg total) by mouth daily. 90 tablet 1   ezetimibe (ZETIA) 10 MG tablet Take 1 tablet (10 mg total) by mouth daily. 90  tablet 0   LORazepam  (ATIVAN ) 0.5 MG tablet TAKE 1 TABLET(0.5 MG) BY MOUTH TWICE DAILY AS NEEDED FOR ANXIETY 60 tablet 2   meclizine (ANTIVERT) 25 MG tablet Take 1 tablet (25 mg total) by mouth 3 (three) times daily as needed for dizziness. 30 tablet 0   mirtazapine  (REMERON ) 45 MG tablet TAKE 1 TABLET(45 MG) BY MOUTH AT BEDTIME 90 tablet 0   ondansetron  (ZOFRAN ) 4 MG tablet Take 1 tablet (4 mg total) by mouth every 8 (eight) hours as needed for nausea or vomiting. 30 tablet 1   rosuvastatin  (CRESTOR ) 40 MG tablet Take 1 tablet (40 mg total) by mouth daily. 90 tablet 3   No current facility-administered medications on file prior to visit.   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   Presence of permanent cardiac pacemaker 11/20/2020   Psychophysiological insomnia 05/04/2023   Screening for lung cancer 04/08/2023   Serum potassium elevated 08/21/2023   Severe protein-calorie malnutrition 05/04/2023   Stroke (HCC)    Vaccine refused by patient 08/03/2022   Shingrix , Flu, Covid, Tdap     Vitamin D  deficiency 08/03/2022   Past Surgical History:  Procedure Laterality Date   CATARACT EXTRACTION BILATERAL W/ ANTERIOR VITRECTOMY Bilateral 11/2021   IR ANGIO INTRA EXTRACRAN SEL COM CAROTID INNOMINATE BILAT MOD SED  04/07/2021   IR ANGIO INTRA EXTRACRAN SEL COM CAROTID INNOMINATE BILAT MOD SED  06/04/2022   IR ANGIO INTRA EXTRACRAN SEL INTERNAL  CAROTID UNI R MOD SED  04/21/2021   IR ANGIO VERTEBRAL SEL SUBCLAVIAN INNOMINATE BILAT MOD SED  04/07/2021   IR ANGIO VERTEBRAL SEL VERTEBRAL BILAT MOD SED  06/04/2022   IR ANGIOGRAM FOLLOW UP STUDY  04/21/2021   IR ANGIOGRAM FOLLOW UP STUDY  04/21/2021   IR CT HEAD LTD  04/21/2021   IR RADIOLOGIST EVAL & MGMT  03/12/2021   IR RADIOLOGIST EVAL & MGMT  05/07/2021   IR TRANSCATH/EMBOLIZ  04/21/2021   NO PAST SURGERIES     PACEMAKER IMPLANT N/A 10/21/2020   Procedure: PACEMAKER IMPLANT;  Surgeon: Waddell Danelle ORN, MD;  Location: [facility];  Service: Cardiovascular;  Laterality: N/A;   RADIOLOGY WITH ANESTHESIA N/A 04/07/2021   Procedure: EMBOLIZATION;  Surgeon: Dolphus Carrion, MD;  Location: [facility];  Service: Radiology;  Laterality: N/A;  RADIOLOGY WITH ANESTHESIA N/A 04/21/2021   Procedure: RADIOLOGY WITH ANESTHESIA EMBOLIZATION;  Surgeon: Dolphus Carrion, MD;  Location: [facility] OR;  Service: Radiology;  Laterality: N/A;   TUBAL LIGATION      Family History  Problem Relation Age of Onset   Hypertension Mother    Diabetes Mother    Heart attack Father    Stroke Father    Emphysema Sister    Stroke Brother    Cancer Daughter        Breast (Braca 2 gene)   Breast cancer Daughter    Social History   Socioeconomic History   Marital status: Single    Spouse name: Not on file   Number of children: 4   Years of education: Not on file   Highest education level: 9th grade  Occupational History   Not on file  Tobacco Use   Smoking status: Every Day    Current packs/day: 0.50    Average packs/day: 1 pack/day for 42.3 years (41.7 ttl pk-yrs)    Types: Cigarettes    Start date: 09/01/1979    Last attempt to quit: 08/31/2020   Smokeless tobacco: Never  Vaping Use   Vaping status: Never Used  Substance and Sexual Activity   Alcohol use: Never   Drug use: Never   Sexual activity: Not Currently    Partners: Male    Birth control/protection: Post-menopausal  Other Topics  Concern   Not on file  Social History Narrative   Not on file   Social Drivers of Health   Financial Resource Strain: Low Risk  (06/24/2024)   Overall Financial Resource Strain (CARDIA)    Difficulty of Paying Living Expenses: Not very hard  Food Insecurity: No Food Insecurity (06/24/2024)   Hunger Vital Sign    Worried About Running Out of Food in the Last Year: Never true    Ran Out of Food in the Last Year: Never true  Transportation Needs: No Transportation Needs (06/24/2024)   PRAPARE - Administrator, Civil Service (Medical): No    Lack of Transportation (Non-Medical): No  Physical Activity: Sufficiently Active (06/24/2024)   Exercise Vital Sign    Days of Exercise per Week: 7 days    Minutes of Exercise per Session: 30 min  Stress: No Stress Concern Present (06/24/2024)   [employer] of Occupational Health - Occupational Stress Questionnaire    Feeling of Stress: Only a little  Social Connections: Socially Isolated (06/24/2024)   Social Connection and Isolation Panel    Frequency of Communication with Friends and Family: Three times a week    Frequency of Social Gatherings with Friends and Family: Never    Attends Religious Services: Never    Database Administrator or Organizations: No    Attends Engineer, Structural: Not on file    Marital Status: Divorced    Objective:  BP 110/70   Pulse 92   Temp 98 F (36.7 C) (Temporal)   Resp 16   Ht 5' 3 (1.6 m)   Wt 112 lb 9.6 oz (51.1 kg)   SpO2 95%   BMI 19.95 kg/m      06/27/2024    8:40 AM 06/06/2024    9:27 AM 06/06/2024    9:25 AM  BP/Weight  Systolic BP 110 143 122  Diastolic BP 70 80 77  Wt. (Lbs) 112.6  111  BMI 19.95 kg/m2  19.66 kg/m2    Physical Exam Vitals reviewed.  Constitutional:  General: She is not in acute distress.    Appearance: Normal appearance. She is normal weight. She is not ill-appearing.  Eyes:     Conjunctiva/sclera: Conjunctivae normal.   Cardiovascular:     Rate and Rhythm: Normal rate and regular rhythm.     Heart sounds: Normal heart sounds. No murmur heard. Pulmonary:     Effort: Pulmonary effort is normal.     Breath sounds: Normal breath sounds. No wheezing.  Abdominal:     General: Bowel sounds are normal.     Palpations: Abdomen is soft.  Musculoskeletal:        General: Normal range of motion.     Cervical back: Normal range of motion.  Skin:    General: Skin is warm.  Neurological:     Mental Status: She is alert. Mental status is at baseline.  Psychiatric:        Mood and Affect: Mood normal.        Behavior: Behavior normal.     Lab Results  Component Value Date   WBC 9.4 05/30/2024   HGB 14.9 05/30/2024   HCT 45.8 05/30/2024   PLT 221 05/30/2024   GLUCOSE 92 05/30/2024   CHOL 143 02/14/2024   TRIG 65 02/14/2024   HDL 52 02/14/2024   LDLCALC 78 02/14/2024   ALT 21 05/30/2024   AST 29 05/30/2024   NA 145 (H) 05/30/2024   K 5.3 (H) 05/30/2024   CL 108 (H) 05/30/2024   CREATININE 0.88 05/30/2024   BUN 20 05/30/2024   CO2 20 05/30/2024   TSH 1.220 05/30/2024   INR 1.0 06/04/2022   HGBA1C 5.6 05/30/2024    Results for orders placed or performed in visit on 05/30/24  POCT glycosylated hemoglobin (Hb A1C)   Collection Time: 05/30/24  9:39 AM  Result Value Ref Range   Hemoglobin A1C     HbA1c POC (<> result, manual entry) 5.6 4.0 - 5.6 %   HbA1c, POC (prediabetic range)     HbA1c, POC (controlled diabetic range)    POCT Lipid Panel   Collection Time: 05/30/24  9:41 AM  Result Value Ref Range   TC 157    HDL 48    TRG 63    LDL 96    Non-HDL 109    TC/HDL    CBC with Differential/Platelet   Collection Time: 05/30/24 10:47 AM  Result Value Ref Range   WBC 9.4 3.4 - 10.8 x10E3/uL   RBC 4.86 3.77 - 5.28 x10E6/uL   Hemoglobin 14.9 11.1 - 15.9 g/dL   Hematocrit 54.1 65.9 - 46.6 %   MCV 94 79 - 97 fL   MCH 30.7 26.6 - 33.0 pg   MCHC 32.5 31.5 - 35.7 g/dL   RDW 87.8 88.2 - 84.5 %    Platelets 221 150 - 450 x10E3/uL   Neutrophils 65 Not Estab. %   Lymphs 25 Not Estab. %   Monocytes 6 Not Estab. %   Eos 3 Not Estab. %   Basos 1 Not Estab. %   Neutrophils Absolute 6.1 1.4 - 7.0 x10E3/uL   Lymphocytes Absolute 2.4 0.7 - 3.1 x10E3/uL   Monocytes Absolute 0.6 0.1 - 0.9 x10E3/uL   EOS (ABSOLUTE) 0.2 0.0 - 0.4 x10E3/uL   Basophils Absolute 0.1 0.0 - 0.2 x10E3/uL   Immature Granulocytes 0 Not Estab. %   Immature Grans (Abs) 0.0 0.0 - 0.1 x10E3/uL  Comprehensive metabolic panel with GFR   Collection Time: 05/30/24 10:47 AM  Result  Value Ref Range   Glucose 92 70 - 99 mg/dL   BUN 20 8 - 27 mg/dL   Creatinine, Ser 9.11 0.57 - 1.00 mg/dL   eGFR 75 >40 fO/fpw/8.26   BUN/Creatinine Ratio 23 12 - 28   Sodium 145 (H) 134 - 144 mmol/L   Potassium 5.3 (H) 3.5 - 5.2 mmol/L   Chloride 108 (H) 96 - 106 mmol/L   CO2 20 20 - 29 mmol/L   Calcium  9.5 8.7 - 10.3 mg/dL   Total Protein 6.6 6.0 - 8.5 g/dL   Albumin 4.4 3.9 - 4.9 g/dL   Globulin, Total 2.2 1.5 - 4.5 g/dL   Bilirubin Total <9.7 0.0 - 1.2 mg/dL   Alkaline Phosphatase 98 49 - 135 IU/L   AST 29 0 - 40 IU/L   ALT 21 0 - 32 IU/L  TSH   Collection Time: 05/30/24 10:47 AM  Result Value Ref Range   TSH 1.220 0.450 - 4.500 uIU/mL  .  Assessment & Plan:   Assessment & Plan Cigarette nicotine  dependence with nicotine -induced disorder Nicotine  dependence, cigarettes She is reducing cigarette consumption to initiate nicotine  patches. - Encourage gradual reduction of cigarette consumption. - Initiate nicotine  patches when reduced to two per day. - Follow up in four weeks to assess progress.    Dizziness Dizziness Dizziness improved, likely due to dehydration. - Encourage adequate hydration    Hyperkalemia Hyperkalemia Elevated potassium likely due to high-potassium foods. - Advise on dietary modifications to reduce potassium intake.      Follow-up: Return in about 4 weeks (around 07/25/2024) for med  check.  An After Visit Summary was printed and given to the patient.  Harrie Cedar, FNP [facility] [phone]

## 2024-06-27 NOTE — Assessment & Plan Note (Signed)
 Nicotine  dependence, cigarettes She is reducing cigarette consumption to initiate nicotine  patches. - Encourage gradual reduction of cigarette consumption. - Initiate nicotine  patches when reduced to two per day. - Follow up in four weeks to assess progress.

## 2024-06-27 NOTE — Assessment & Plan Note (Signed)
 Hyperkalemia Elevated potassium likely due to high-potassium foods. - Advise on dietary modifications to reduce potassium intake.

## 2024-06-27 NOTE — Assessment & Plan Note (Signed)
 Dizziness Dizziness improved, likely due to dehydration. - Encourage adequate hydration

## 2024-07-06 ENCOUNTER — Other Ambulatory Visit: Payer: Self-pay | Admitting: Family Medicine

## 2024-07-06 DIAGNOSIS — E43 Unspecified severe protein-calorie malnutrition: Secondary | ICD-10-CM

## 2024-07-09 ENCOUNTER — Other Ambulatory Visit: Payer: Self-pay | Admitting: Family Medicine

## 2024-07-09 DIAGNOSIS — J410 Simple chronic bronchitis: Secondary | ICD-10-CM

## 2024-07-12 ENCOUNTER — Telehealth: Payer: Self-pay | Admitting: Family Medicine

## 2024-07-12 NOTE — Telephone Encounter (Signed)
 Called and left a voicemail as well as sent a MyChart message for patient to call to reschedule the appointment for 07/25/24 due to the provider being out of office.

## 2024-07-13 ENCOUNTER — Other Ambulatory Visit: Payer: Self-pay | Admitting: Family Medicine

## 2024-07-13 DIAGNOSIS — F331 Major depressive disorder, recurrent, moderate: Secondary | ICD-10-CM

## 2024-07-13 DIAGNOSIS — F411 Generalized anxiety disorder: Secondary | ICD-10-CM

## 2024-07-17 ENCOUNTER — Ambulatory Visit: Payer: Medicaid Other

## 2024-07-17 DIAGNOSIS — I442 Atrioventricular block, complete: Secondary | ICD-10-CM

## 2024-07-19 ENCOUNTER — Ambulatory Visit: Payer: Self-pay | Admitting: Cardiology

## 2024-07-19 LAB — CUP PACEART REMOTE DEVICE CHECK
Battery Voltage: 75
Date Time Interrogation Session: 20251119125226
Implantable Lead Connection Status: 753985
Implantable Lead Connection Status: 753985
Implantable Lead Implant Date: 20220221
Implantable Lead Implant Date: 20220221
Implantable Lead Location: 753859
Implantable Lead Location: 753860
Implantable Lead Model: 377171
Implantable Lead Model: 377171
Implantable Lead Serial Number: 8000187789
Implantable Lead Serial Number: 8000197459
Implantable Pulse Generator Implant Date: 20220221
Pulse Gen Model: 407145
Pulse Gen Serial Number: 70053960

## 2024-07-19 NOTE — Progress Notes (Signed)
 Remote PPM Transmission

## 2024-07-25 ENCOUNTER — Ambulatory Visit: Admitting: Family Medicine

## 2024-08-30 ENCOUNTER — Ambulatory Visit: Admitting: Family Medicine

## 2024-08-30 ENCOUNTER — Encounter: Payer: Self-pay | Admitting: Family Medicine

## 2024-08-30 VITALS — BP 110/60 | HR 85 | Temp 97.8°F | Resp 16 | Ht 63.0 in | Wt 116.0 lb

## 2024-08-30 DIAGNOSIS — E559 Vitamin D deficiency, unspecified: Secondary | ICD-10-CM | POA: Diagnosis not present

## 2024-08-30 DIAGNOSIS — F330 Major depressive disorder, recurrent, mild: Secondary | ICD-10-CM | POA: Diagnosis not present

## 2024-08-30 DIAGNOSIS — F411 Generalized anxiety disorder: Secondary | ICD-10-CM | POA: Diagnosis not present

## 2024-08-30 DIAGNOSIS — E782 Mixed hyperlipidemia: Secondary | ICD-10-CM | POA: Diagnosis not present

## 2024-08-30 DIAGNOSIS — Z87891 Personal history of nicotine dependence: Secondary | ICD-10-CM

## 2024-08-30 NOTE — Assessment & Plan Note (Addendum)
 Depression well-managed with escitalopram  and mirtazapine . No current symptoms. Mirtazapine  aids in sleep and appetite regulation. Recent weight gain noted, possibly due to increased appetite after smoking cessation. - Continue escitalopram  20 mg daily. - Consider reducing mirtazapine  dose by cutting the tablet in half to manage weight gain while maintaining sleep benefits. - Mirtazapine  was started to hep

## 2024-08-30 NOTE — Assessment & Plan Note (Addendum)
 Well controlled.  - On Crestor  40 mg daily and ezetimibe  10 mg daily for cholesterol management - Last cholesterol check in September was good Continue to work on eating a healthy diet and exercise.

## 2024-08-30 NOTE — Patient Instructions (Signed)
" °  VISIT SUMMARY: Today, you had a routine follow-up visit to check on your COPD, anxiety, and overall health. Your respiratory status is stable, and your anxiety and depression are well-controlled. You have successfully quit smoking for one month. Your cholesterol levels are good, and you are maintaining a healthy lifestyle.  YOUR PLAN: CHRONIC OBSTRUCTIVE PULMONARY DISEASE (COPD): Your COPD is being managed with Breztri  and an albuterol  inhaler. You use the albuterol  inhaler once or twice daily, especially before physical activities. -Continue using Breztri , two puffs once daily. -Use the albuterol  inhaler as needed, especially before activities that may cause shortness of breath. -Remember to use the albuterol  inhaler before any physical activity that might cause shortness of breath.  MIXED HYPERLIPIDEMIA: Your cholesterol levels are being managed with Crestor  and ezetimibe , and your recent cholesterol check was good. -Continue taking Crestor  40 mg once daily. -Continue taking ezetimibe  10 mg once daily.  MAJOR DEPRESSIVE DISORDER, RECURRENT, MODERATE: Your depression is well-managed with escitalopram  and mirtazapine . There are no current symptoms, but there has been some weight gain possibly due to increased appetite after quitting smoking. -Continue taking escitalopram  20 mg daily. -Consider reducing the mirtazapine  dose by cutting the tablet in half to manage weight gain while maintaining sleep benefits.  GENERALIZED ANXIETY DISORDER: Your anxiety is well-controlled with your current medication regimen. -Continue your current medication regimen.  GENERAL HEALTH MAINTENANCE: You are eligible for lung cancer screening due to your smoking history. Your last CT scan was in April 2025, and your recent labs were normal. -We will schedule your annual CT scan for lung cancer screening in the spring. -Your medications have been refilled as needed.                      Contains  text generated by [employer].                                 Contains text generated by [employer].   "

## 2024-08-30 NOTE — Assessment & Plan Note (Signed)
 Eligible for lung cancer screening due to smoking history. Last CT scan was in April 2025. No immediate need for blood work as recent labs were normal. - Will schedule annual CT scan for lung cancer screening in the spring. - Refilled medications as needed.

## 2024-08-30 NOTE — Progress Notes (Signed)
 "  Subjective:  Patient ID: Leslie Franklin, female    DOB: [birth year]  Age: 63 y.o. MRN: [id number]  Chief Complaint  Patient presents with   Medical Management of Chronic Issues    HPI: Discussed the use of AI scribe software for clinical note transcription with the patient, who gave verbal consent to proceed.  History of Present Illness [name] is a 63 year old female with COPD and anxiety who presents for a routine follow-up visit.  Respiratory symptoms and copd management - Respiratory status is stable with Breztri , two puffs once daily, and albuterol  inhaler as needed, typically once or twice daily - Uses albuterol  inhaler after exertion- No chest pain, respiratory distress, or peripheral edema - Received flu vaccination at last visit  Psychiatric symptoms and sleep - Anxiety and depression are well-controlled with escitalopram  20 mg daily and lorazepam  as needed - No recent depression, anhedonia, or suicidal ideation - Good energy levels, concentration, and appetite - Mirtazapine  used for sleep and appetite; generally good sleep with occasional difficulty initiating sleep  Smoking cessation - Quit smoking one month ago!! - Managing cravings by staying busy and eating when feeling urge to smoke  Vestibular and gastrointestinal symptoms - No recent episodes of vertigo or dizziness; not using meclizine  - Occasional nausea, not significant - Bowel and bladder functions are normal  Hyperlipidemia and metabolic health - On Crestor  40 mg daily and ezetimibe  10 mg daily for cholesterol management - Last cholesterol check in September was good - A1c and thyroid  levels are normal - Eats healthy and gets plenty of exercise  Cardiovascular risk reduction - Takes aspirin  81 mg daily       08/30/2024    9:13 AM 05/30/2024    9:26 AM 02/14/2024    8:19 AM 11/09/2023    8:59 AM 08/05/2023    1:51 PM  Depression screen PHQ 2/9  Decreased Interest 0 0 0 1 3  Down, Depressed,  Hopeless 0 0 0 1 2  PHQ - 2 Score 0 0 0 2 5  Altered sleeping 0 0 0 0 0  Tired, decreased energy 0 0 0 2 0  Change in appetite 0 0 1 1 0  Feeling bad or failure about yourself  0 1 0 0 0  Trouble concentrating 0 0 0 1 3  Moving slowly or fidgety/restless 0 0 0 0 2  Suicidal thoughts 0 0 0 0 0  PHQ-9 Score 0 1  1  6  10    Difficult doing work/chores Not difficult at all Not difficult at all Not difficult at all Not difficult at all Not difficult at all     Data saved with a previous flowsheet row definition        01/31/2024    9:06 AM  Fall Risk   Falls in the past year? 0  Number falls in past yr: 0  Injury with Fall? 0   Follow up Falls evaluation completed     Data saved with a previous flowsheet row definition    Patient Care Team: Sherre Clapper, MD as PCP - General (Family Medicine) Inocencio Soyla Lunger, MD as PCP - Electrophysiology (Cardiology) Revankar, Jennifer SAUNDERS, MD as PCP - Cardiology (Cardiology) Sherre Clapper, MD as Referring Physician (Family Medicine) Skeet Juliene SAUNDERS, DO as Consulting Physician (Neurology)   Review of Systems  Constitutional:  Negative for chills, fatigue and fever.  HENT:  Negative for congestion, ear pain and sore throat.   Respiratory:  Negative for cough  and shortness of breath.   Cardiovascular:  Negative for chest pain.  Gastrointestinal:  Negative for abdominal pain, constipation, diarrhea, nausea and vomiting.  Genitourinary:  Negative for dysuria and urgency.  Musculoskeletal:  Negative for arthralgias and myalgias.  Skin:  Negative for rash.  Neurological:  Negative for dizziness and headaches.  Psychiatric/Behavioral:  Negative for dysphoric mood. The patient is not nervous/anxious.     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   Presence of permanent cardiac pacemaker 11/20/2020   Psychophysiological insomnia 05/04/2023   Screening for lung cancer 04/08/2023   Serum potassium elevated 08/21/2023   Severe protein-calorie malnutrition 05/04/2023   Stroke (HCC)    Vaccine refused by patient 08/03/2022   Shingrix , Flu, Covid, Tdap     Vitamin D  deficiency 08/03/2022   Past Surgical History:  Procedure Laterality Date   CATARACT EXTRACTION BILATERAL W/ ANTERIOR VITRECTOMY Bilateral 11/2021   EYE SURGERY     IR ANGIO INTRA EXTRACRAN SEL COM CAROTID INNOMINATE BILAT MOD SED  04/07/2021   IR ANGIO INTRA EXTRACRAN SEL COM CAROTID INNOMINATE BILAT MOD SED  06/04/2022   IR ANGIO INTRA EXTRACRAN SEL INTERNAL CAROTID UNI R MOD SED  04/21/2021   IR ANGIO VERTEBRAL SEL SUBCLAVIAN INNOMINATE BILAT MOD SED  04/07/2021   IR ANGIO VERTEBRAL SEL VERTEBRAL BILAT MOD SED  06/04/2022   IR ANGIOGRAM FOLLOW UP STUDY  04/21/2021   IR ANGIOGRAM FOLLOW UP STUDY  04/21/2021   IR CT HEAD LTD  04/21/2021   IR RADIOLOGIST EVAL & MGMT  03/12/2021   IR RADIOLOGIST EVAL & MGMT  05/07/2021   IR TRANSCATH/EMBOLIZ  04/21/2021   NO PAST SURGERIES     PACEMAKER IMPLANT N/A 10/21/2020   Procedure: PACEMAKER IMPLANT;  Surgeon: Waddell Danelle ORN, MD;  Location: [facility];  Service: Cardiovascular;  Laterality: N/A;   RADIOLOGY WITH ANESTHESIA N/A 04/07/2021   Procedure: EMBOLIZATION;  Surgeon: Dolphus Carrion, MD;  Location: [facility];  Service: Radiology;  Laterality: N/A;   RADIOLOGY WITH ANESTHESIA N/A 04/21/2021   Procedure: RADIOLOGY WITH ANESTHESIA  EMBOLIZATION;  Surgeon: Dolphus Carrion, MD;  Location: [facility];  Service: Radiology;  Laterality: N/A;   TUBAL LIGATION      Family History  Problem Relation Age of Onset   Hypertension Mother    Diabetes Mother    Heart attack Father    Stroke Father    Emphysema Sister    Stroke Brother    Cancer Daughter        Breast (Braca 2 gene)   Breast cancer Daughter    Social History   Socioeconomic History   Marital status: Single    Spouse name: Not on file   Number of children: 4   Years of education: Not on file   Highest education level: 9th grade  Occupational History   Not on file  Tobacco Use   Smoking status: Former    Current packs/day: 0.00  Average packs/day: 1 pack/day for 42.3 years (41.7 ttl pk-yrs)    Types: Cigarettes    Start date: 09/01/1979    Quit date: 07/05/2024    Years since quitting: 0.1   Smokeless tobacco: Never  Vaping Use   Vaping status: Never Used  Substance and Sexual Activity   Alcohol use: Never   Drug use: Never   Sexual activity: Not Currently    Partners: Male    Birth control/protection: Post-menopausal  Other Topics Concern   Not on file  Social History Narrative   Not on file   Social Drivers of Health   Tobacco Use: Medium Risk (08/30/2024)   Patient History    Smoking Tobacco Use: Former    Smokeless Tobacco Use: Never    Passive Exposure: Not on Actuary Strain: Low Risk (06/24/2024)   Overall Financial Resource Strain (CARDIA)    Difficulty of Paying Living Expenses: Not very hard  Food Insecurity: No Food Insecurity (06/24/2024)   Epic    Worried About Radiation Protection Practitioner of Food in the Last Year: Never true    Ran Out of Food in the Last Year: Never true  Transportation Needs: No Transportation Needs (06/24/2024)   Epic    Lack of Transportation (Medical): No    Lack of Transportation (Non-Medical): No  Physical Activity: Sufficiently Active (06/24/2024)   Exercise Vital Sign    Days of Exercise per  Week: 7 days    Minutes of Exercise per Session: 30 min  Stress: No Stress Concern Present (06/24/2024)   [employer] of Occupational Health - Occupational Stress Questionnaire    Feeling of Stress: Only a little  Social Connections: Socially Isolated (06/24/2024)   Social Connection and Isolation Panel    Frequency of Communication with Friends and Family: Three times a week    Frequency of Social Gatherings with Friends and Family: Never    Attends Religious Services: Never    Database Administrator or Organizations: No    Attends Engineer, Structural: Not on file    Marital Status: Divorced  Depression (PHQ2-9): Low Risk (08/30/2024)   Depression (PHQ2-9)    PHQ-2 Score: 0  Alcohol Screen: Low Risk (12/14/2022)   Alcohol Screen    Last Alcohol Screening Score (AUDIT): 0  Housing: Low Risk (06/24/2024)   Epic    Unable to Pay for Housing in the Last Year: No    Number of Times Moved in the Last Year: 0    Homeless in the Last Year: No  Utilities: Not At Risk (08/03/2022)   [employer] Utilities    Threatened with loss of utilities: No  Health Literacy: Adequate Health Literacy (04/06/2023)   B1300 Health Literacy    Frequency of need for help with medical instructions: Never    Objective:  BP 110/60   Pulse 85   Temp 97.8 F (36.6 C)   Resp 16   Ht 5' 3 (1.6 m)   Wt 116 lb (52.6 kg)   SpO2 95%   BMI 20.55 kg/m      08/30/2024    8:52 AM 06/27/2024    8:40 AM 06/06/2024    9:27 AM  BP/Weight  Systolic BP 110 110 143  Diastolic BP 60 70 80  Wt. (Lbs) 116 112.6   BMI 20.55 kg/m2 19.95 kg/m2     Physical Exam Vitals reviewed.  Constitutional:      Appearance: Normal appearance. She is normal weight.  Neck:  Vascular: No carotid bruit.  Cardiovascular:     Rate and Rhythm: Normal rate and regular rhythm.     Heart sounds: Normal heart sounds.  Pulmonary:     Effort: Pulmonary effort is normal. No respiratory distress.     Breath sounds: Normal  breath sounds.  Abdominal:     General: Abdomen is flat. Bowel sounds are normal.     Palpations: Abdomen is soft.     Tenderness: There is no abdominal tenderness.  Neurological:     Mental Status: She is alert and oriented to person, place, and time.  Psychiatric:        Mood and Affect: Mood normal.        Behavior: Behavior normal.         Lab Results  Component Value Date   WBC 9.4 05/30/2024   HGB 14.9 05/30/2024   HCT 45.8 05/30/2024   PLT 221 05/30/2024   GLUCOSE 92 05/30/2024   CHOL 143 02/14/2024   TRIG 65 02/14/2024   HDL 52 02/14/2024   LDLCALC 78 02/14/2024   ALT 21 05/30/2024   AST 29 05/30/2024   NA 145 (H) 05/30/2024   K 5.3 (H) 05/30/2024   CL 108 (H) 05/30/2024   CREATININE 0.88 05/30/2024   BUN 20 05/30/2024   CO2 20 05/30/2024   TSH 1.220 05/30/2024   INR 1.0 06/04/2022   HGBA1C 5.6 05/30/2024    Results for orders placed or performed in visit on 07/17/24  CUP PACEART REMOTE DEVICE CHECK   Collection Time: 07/19/24 12:52 PM  Result Value Ref Range   Pulse Generator Manufacturer BIT[employer]    Date Time Interrogation Session [phone]    Pulse Gen Model [id number] Edora 8 DR-T    Pulse Gen Serial Number [id number]    Clinic [facility]    Implantable Pulse Generator Type Implantable Pulse Generator    Implantable Pulse Generator Implant Date [id number]    Implantable Lead Manufacturer BITR    Implantable Lead Model [id number] Linox Smart ProMRI SD 75/18    Implantable Lead Serial Number [id number]    Implantable Lead Implant Date 79779778    Implantable Lead Location Detail 1 UNKNOWN    Implantable Lead Location [id number]    Implantable Lead Connection Status [id number]    Implantable Lead Manufacturer BITR    Implantable Lead Model [id number] Linox Smart ProMRI SD 75/18    Implantable Lead Serial Number [id number]    Implantable Lead Implant Date 79779778    Implantable Lead Location Detail 1 UNKNOWN    Implantable Lead Location [id number]    Implantable  Lead Connection Status [id number]    Battery Voltage 75%   .  Assessment & Plan:   Assessment & Plan Mixed hyperlipidemia Well controlled.  - On Crestor  40 mg daily and ezetimibe  10 mg daily for cholesterol management - Last cholesterol check in September was good Continue to work on eating a healthy diet and exercise.    Vitamin D  deficiency Check vitamin D      GAD (generalized anxiety disorder) - On Crestor  40 mg daily and ezetimibe  10 mg daily for cholesterol management - Last cholesterol check in September was good    Mild episode of recurrent major depressive disorder Depression well-managed with escitalopram  and mirtazapine . No current symptoms. Mirtazapine  aids in sleep and appetite regulation. Recent weight gain noted, possibly due to increased appetite after smoking cessation. - Continue escitalopram  20 mg daily. - Consider reducing mirtazapine  dose by cutting the tablet in half to  manage weight gain while maintaining sleep benefits. - Mirtazapine  was started to hep    History of tobacco use Eligible for lung cancer screening due to smoking history. Last CT scan was in April 2025. No immediate need for blood work as recent labs were normal. - Will schedule annual CT scan for lung cancer screening in the spring. - Refilled medications as needed.      Body mass index is 20.55 kg/m.   No orders of the defined types were placed in this encounter.   No orders of the defined types were placed in this encounter.      Follow-up: Return in about 6 months (around 02/27/2025) for chronic follow up.  An After Visit Summary was printed and given to the patient.  Abigail Free, MD [facility] [phone]     [1]  Current Outpatient Medications on File Prior to Visit  Medication Sig Dispense Refill   albuterol  (VENTOLIN  HFA) 108 (90 Base) MCG/ACT inhaler INHALE 2 PUFFS INTO THE LUNGS EVERY 6 HOURS AS NEEDED FOR WHEEZING OR SHORTNESS OF BREATH 18 g 5    aspirin  81 MG chewable tablet Chew 81 mg by mouth daily.     Budeson-Glycopyrrol-Formoterol  (BREZTRI  AEROSPHERE) 160-9-4.8 MCG/ACT AERO Inhale 2 puffs into the lungs 2 (two) times daily. 3 each 3   escitalopram  (LEXAPRO ) 20 MG tablet TAKE 1 TABLET(20 MG) BY MOUTH DAILY 90 tablet 1   ezetimibe  (ZETIA ) 10 MG tablet Take 1 tablet (10 mg total) by mouth daily. 90 tablet 0   LORazepam  (ATIVAN ) 0.5 MG tablet TAKE 1 TABLET(0.5 MG) BY MOUTH TWICE DAILY AS NEEDED FOR ANXIETY 60 tablet 2   meclizine  (ANTIVERT ) 25 MG tablet Take 1 tablet (25 mg total) by mouth 3 (three) times daily as needed for dizziness. 30 tablet 0   mirtazapine  (REMERON ) 45 MG tablet TAKE 1 TABLET(45 MG) BY MOUTH AT BEDTIME 90 tablet 0   ondansetron  (ZOFRAN ) 4 MG tablet Take 1 tablet (4 mg total) by mouth every 8 (eight) hours as needed for nausea or vomiting. 30 tablet 1   rosuvastatin  (CRESTOR ) 40 MG tablet Take 1 tablet (40 mg total) by mouth daily. 90 tablet 3   No current facility-administered medications on file prior to visit.   "

## 2024-08-30 NOTE — Assessment & Plan Note (Addendum)
 Check vitamin D.

## 2024-08-30 NOTE — Assessment & Plan Note (Addendum)
-   On Crestor  40 mg daily and ezetimibe  10 mg daily for cholesterol management - Last cholesterol check in September was good

## 2024-09-02 ENCOUNTER — Other Ambulatory Visit: Payer: Self-pay | Admitting: Family Medicine

## 2024-09-02 DIAGNOSIS — E782 Mixed hyperlipidemia: Secondary | ICD-10-CM

## 2024-10-16 ENCOUNTER — Ambulatory Visit: Payer: Medicaid Other

## 2024-10-30 ENCOUNTER — Ambulatory Visit: Admitting: Cardiology

## 2024-12-05 ENCOUNTER — Ambulatory Visit: Admitting: Vascular Surgery

## 2024-12-05 ENCOUNTER — Encounter (HOSPITAL_COMMUNITY)

## 2025-01-15 ENCOUNTER — Ambulatory Visit: Payer: Medicaid Other

## 2025-01-30 ENCOUNTER — Ambulatory Visit: Admitting: Neurology

## 2025-02-27 ENCOUNTER — Ambulatory Visit: Admitting: Family Medicine

## 2025-04-16 ENCOUNTER — Ambulatory Visit: Payer: Medicaid Other
